# Patient Record
Sex: Female | Born: 1960 | Hispanic: Yes | Marital: Single
Health system: Southern US, Academic
[De-identification: ages and names within clinical notes are randomized; demographics above are authoritative.]

## PROBLEM LIST (undated history)

## (undated) ENCOUNTER — Ambulatory Visit: Payer: PRIVATE HEALTH INSURANCE

## (undated) ENCOUNTER — Encounter

## (undated) ENCOUNTER — Encounter
Attending: Student in an Organized Health Care Education/Training Program | Primary: Student in an Organized Health Care Education/Training Program

## (undated) ENCOUNTER — Ambulatory Visit

## (undated) ENCOUNTER — Telehealth

## (undated) ENCOUNTER — Encounter: Attending: Adult Health | Primary: Adult Health

## (undated) ENCOUNTER — Encounter
Attending: Female Pelvic Medicine and Reconstructive Surgery | Primary: Female Pelvic Medicine and Reconstructive Surgery

## (undated) ENCOUNTER — Telehealth: Attending: Ambulatory Care | Primary: Ambulatory Care

## (undated) ENCOUNTER — Encounter: Payer: PRIVATE HEALTH INSURANCE | Attending: Dermatology | Primary: Dermatology

## (undated) ENCOUNTER — Other Ambulatory Visit

## (undated) ENCOUNTER — Ambulatory Visit: Payer: Medicare (Managed Care)

## (undated) ENCOUNTER — Encounter: Payer: PRIVATE HEALTH INSURANCE | Attending: Registered" | Primary: Registered"

## (undated) ENCOUNTER — Ambulatory Visit
Payer: PRIVATE HEALTH INSURANCE | Attending: Student in an Organized Health Care Education/Training Program | Primary: Student in an Organized Health Care Education/Training Program

## (undated) ENCOUNTER — Ambulatory Visit: Payer: PRIVATE HEALTH INSURANCE | Attending: Registered" | Primary: Registered"

## (undated) ENCOUNTER — Telehealth
Attending: Student in an Organized Health Care Education/Training Program | Primary: Student in an Organized Health Care Education/Training Program

## (undated) ENCOUNTER — Ambulatory Visit: Attending: Registered" | Primary: Registered"

## (undated) ENCOUNTER — Encounter: Attending: Surgery | Primary: Surgery

## (undated) ENCOUNTER — Telehealth: Attending: Adult Health | Primary: Adult Health

## (undated) ENCOUNTER — Ambulatory Visit: Payer: MEDICAID

## (undated) ENCOUNTER — Ambulatory Visit
Payer: Medicare (Managed Care) | Attending: Student in an Organized Health Care Education/Training Program | Primary: Student in an Organized Health Care Education/Training Program

## (undated) ENCOUNTER — Ambulatory Visit: Payer: MEDICARE

## (undated) ENCOUNTER — Telehealth: Attending: "Endocrinology | Primary: "Endocrinology

## (undated) ENCOUNTER — Ambulatory Visit
Payer: MEDICARE | Attending: Student in an Organized Health Care Education/Training Program | Primary: Student in an Organized Health Care Education/Training Program

## (undated) ENCOUNTER — Ambulatory Visit
Attending: Student in an Organized Health Care Education/Training Program | Primary: Student in an Organized Health Care Education/Training Program

## (undated) ENCOUNTER — Non-Acute Institutional Stay
Payer: PRIVATE HEALTH INSURANCE | Attending: Physical Medicine & Rehabilitation | Primary: Physical Medicine & Rehabilitation

## (undated) ENCOUNTER — Encounter: Attending: Ambulatory Care | Primary: Ambulatory Care

## (undated) ENCOUNTER — Ambulatory Visit: Payer: PRIVATE HEALTH INSURANCE | Attending: Internal Medicine | Primary: Internal Medicine

## (undated) ENCOUNTER — Ambulatory Visit: Attending: Ophthalmology | Primary: Ophthalmology

## (undated) ENCOUNTER — Encounter: Attending: Pharmacist | Primary: Pharmacist

## (undated) ENCOUNTER — Encounter: Attending: Dermatology | Primary: Dermatology

## (undated) ENCOUNTER — Ambulatory Visit: Payer: PRIVATE HEALTH INSURANCE | Attending: Rheumatology | Primary: Rheumatology

## (undated) DIAGNOSIS — E119 Type 2 diabetes mellitus without complications: Secondary | ICD-10-CM

## (undated) HISTORY — PX: EYE SURGERY: SHX253

---
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
## 2004-03-28 HISTORY — PX: SHOULDER SURGERY: SHX246

## 2014-07-11 ENCOUNTER — Other Ambulatory Visit: Payer: Self-pay | Admitting: Oncology

## 2014-07-11 DIAGNOSIS — Z1231 Encounter for screening mammogram for malignant neoplasm of breast: Secondary | ICD-10-CM

## 2014-08-18 ENCOUNTER — Other Ambulatory Visit: Payer: Self-pay | Admitting: Nurse Practitioner

## 2014-08-18 ENCOUNTER — Ambulatory Visit
Admission: RE | Admit: 2014-08-18 | Discharge: 2014-08-18 | Disposition: A | Discharge status: Home / Self Care | Payer: 59 | Source: Ambulatory Visit | Attending: Nurse Practitioner | Admitting: Nurse Practitioner

## 2014-08-18 DIAGNOSIS — Z1231 Encounter for screening mammogram for malignant neoplasm of breast: Secondary | ICD-10-CM | POA: Diagnosis present

## 2016-09-26 ENCOUNTER — Ambulatory Visit: Admission: RE | Admit: 2016-09-26 | Discharge: 2016-09-26

## 2016-09-26 DIAGNOSIS — E11319 Type 2 diabetes mellitus with unspecified diabetic retinopathy without macular edema: Principal | ICD-10-CM

## 2016-09-26 DIAGNOSIS — Z1239 Encounter for other screening for malignant neoplasm of breast: Secondary | ICD-10-CM

## 2016-09-26 DIAGNOSIS — D649 Anemia, unspecified: Secondary | ICD-10-CM

## 2016-09-26 MED ORDER — INSULIN HUMAN U-100 NPH-REGULR 70-30 MIX 100 UNIT/ML SUBCUTANEOUS SUSP
2 refills | 0 days | Status: CP
Start: 2016-09-26 — End: 2017-05-23

## 2016-09-26 MED ORDER — CANAGLIFLOZIN 100 MG TABLET
ORAL_TABLET | Freq: Every day | ORAL | 0 refills | 0 days
Start: 2016-09-26 — End: 2017-05-26

## 2016-09-26 MED ORDER — ATORVASTATIN 10 MG TABLET
ORAL_TABLET | Freq: Every day | ORAL | 1 refills | 0 days | Status: CP
Start: 2016-09-26 — End: 2017-08-29

## 2016-09-26 MED ORDER — METFORMIN 1,000 MG TABLET
ORAL_TABLET | Freq: Two times a day (BID) | ORAL | 1 refills | 0 days | Status: CP
Start: 2016-09-26 — End: 2016-10-31

## 2016-09-26 MED ORDER — ASPIRIN 81 MG TABLET,DELAYED RELEASE
ORAL_TABLET | Freq: Every day | ORAL | 2 refills | 0.00000 days | Status: SS
Start: 2016-09-26 — End: 2018-03-01

## 2016-09-26 MED ORDER — OMEPRAZOLE 20 MG CAPSULE,DELAYED RELEASE
ORAL_CAPSULE | Freq: Two times a day (BID) | ORAL | 1 refills | 0 days
Start: 2016-09-26 — End: 2017-04-21

## 2016-10-05 ENCOUNTER — Ambulatory Visit
Admission: RE | Admit: 2016-10-05 | Discharge: 2016-10-05 | Attending: Student in an Organized Health Care Education/Training Program | Admitting: Student in an Organized Health Care Education/Training Program

## 2016-10-05 DIAGNOSIS — H40003 Preglaucoma, unspecified, bilateral: Principal | ICD-10-CM

## 2016-10-17 ENCOUNTER — Ambulatory Visit: Admission: RE | Admit: 2016-10-17 | Discharge: 2016-10-17 | Disposition: A | Discharge status: Home / Self Care

## 2016-10-17 DIAGNOSIS — Z1239 Encounter for other screening for malignant neoplasm of breast: Principal | ICD-10-CM

## 2016-10-31 ENCOUNTER — Ambulatory Visit: Admission: RE | Admit: 2016-10-31 | Discharge: 2016-10-31 | Disposition: A | Discharge status: Home / Self Care

## 2016-10-31 DIAGNOSIS — E11319 Type 2 diabetes mellitus with unspecified diabetic retinopathy without macular edema: Principal | ICD-10-CM

## 2016-10-31 DIAGNOSIS — R102 Pelvic and perineal pain: Secondary | ICD-10-CM

## 2016-10-31 DIAGNOSIS — Z124 Encounter for screening for malignant neoplasm of cervix: Secondary | ICD-10-CM

## 2016-10-31 MED ORDER — METFORMIN 1,000 MG TABLET: 1000 mg | each | 6 refills | 0 days

## 2016-10-31 MED ORDER — METFORMIN 1,000 MG TABLET
Freq: Two times a day (BID) | ORAL | 6 refills | 0.00000 days | Status: CP
Start: 2016-10-31 — End: 2016-10-31

## 2016-11-16 ENCOUNTER — Ambulatory Visit
Admission: RE | Admit: 2016-11-16 | Discharge: 2016-11-16 | Disposition: A | Discharge status: Home / Self Care | Attending: Internal Medicine | Admitting: Internal Medicine

## 2016-11-16 DIAGNOSIS — E11319 Type 2 diabetes mellitus with unspecified diabetic retinopathy without macular edema: Principal | ICD-10-CM

## 2016-11-16 DIAGNOSIS — E113513 Type 2 diabetes mellitus with proliferative diabetic retinopathy with macular edema, bilateral: Secondary | ICD-10-CM

## 2016-11-16 MED ORDER — BLOOD-GLUCOSE METER
0 refills | 0.00000 days | Status: CP
Start: 2016-11-16 — End: 2017-05-26

## 2016-11-16 MED ORDER — LANCING DEVICE
0 refills | 0 days
Start: 2016-11-16 — End: 2017-11-10

## 2016-11-16 MED ORDER — BLOOD SUGAR DIAGNOSTIC STRIPS: each | 12 refills | 0 days

## 2016-11-16 MED ORDER — BLOOD SUGAR DIAGNOSTIC STRIPS
12 refills | 0 days | Status: CP
Start: 2016-11-16 — End: 2016-11-16

## 2016-11-16 MED ORDER — LANCETS 30 GAUGE
12 refills | 0 days
Start: 2016-11-16 — End: 2017-11-10

## 2016-11-16 MED ORDER — LANCETS
12 refills | 0 days | Status: CP
Start: 2016-11-16 — End: 2017-05-26

## 2016-11-16 MED ORDER — BLOOD-GLUCOSE METER: each | 0 refills | 0 days

## 2016-11-25 ENCOUNTER — Ambulatory Visit: Admission: RE | Admit: 2016-11-25 | Discharge: 2016-11-25 | Disposition: A | Discharge status: Home / Self Care

## 2016-11-25 DIAGNOSIS — R102 Pelvic and perineal pain: Principal | ICD-10-CM

## 2016-11-29 ENCOUNTER — Ambulatory Visit: Admission: RE | Admit: 2016-11-29 | Discharge: 2016-11-29 | Disposition: A | Discharge status: Home / Self Care

## 2016-11-29 DIAGNOSIS — D649 Anemia, unspecified: Secondary | ICD-10-CM

## 2016-11-29 DIAGNOSIS — E11319 Type 2 diabetes mellitus with unspecified diabetic retinopathy without macular edema: Principal | ICD-10-CM

## 2016-11-29 DIAGNOSIS — N898 Other specified noninflammatory disorders of vagina: Secondary | ICD-10-CM

## 2016-11-29 DIAGNOSIS — Z113 Encounter for screening for infections with a predominantly sexual mode of transmission: Secondary | ICD-10-CM

## 2016-11-29 DIAGNOSIS — L732 Hidradenitis suppurativa: Secondary | ICD-10-CM

## 2016-11-29 MED ORDER — METFORMIN 1,000 MG TABLET
ORAL_TABLET | Freq: Two times a day (BID) | ORAL | 1 refills | 0.00000 days | Status: CP
Start: 2016-11-29 — End: 2017-05-23

## 2016-11-29 MED ORDER — METFORMIN 1,000 MG TABLET: 1000 mg | tablet | Freq: Two times a day (BID) | 1 refills | 0 days | Status: AC

## 2016-11-29 MED ORDER — DOXYCYCLINE HYCLATE 100 MG CAPSULE
ORAL_CAPSULE | Freq: Two times a day (BID) | ORAL | 0 refills | 0.00000 days | Status: CP
Start: 2016-11-29 — End: 2016-11-29

## 2016-11-29 MED ORDER — DOXYCYCLINE HYCLATE 100 MG CAPSULE: capsule | 0 refills | 0 days

## 2016-12-23 ENCOUNTER — Ambulatory Visit: Admission: RE | Admit: 2016-12-23 | Discharge: 2016-12-23

## 2016-12-23 DIAGNOSIS — H4312 Vitreous hemorrhage, left eye: Principal | ICD-10-CM

## 2016-12-23 DIAGNOSIS — H4311 Vitreous hemorrhage, right eye: Secondary | ICD-10-CM

## 2017-01-11 ENCOUNTER — Ambulatory Visit
Admission: RE | Admit: 2017-01-11 | Discharge: 2017-01-11 | Payer: Commercial Managed Care - PPO | Attending: Student in an Organized Health Care Education/Training Program | Admitting: Student in an Organized Health Care Education/Training Program

## 2017-01-11 DIAGNOSIS — H2513 Age-related nuclear cataract, bilateral: Secondary | ICD-10-CM

## 2017-01-11 DIAGNOSIS — H4051X3 Glaucoma secondary to other eye disorders, right eye, severe stage: Principal | ICD-10-CM

## 2017-01-25 ENCOUNTER — Ambulatory Visit
Admission: RE | Admit: 2017-01-25 | Discharge: 2017-01-25 | Payer: Commercial Managed Care - PPO | Attending: Registered" | Admitting: Registered"

## 2017-01-25 DIAGNOSIS — E669 Obesity, unspecified: Secondary | ICD-10-CM

## 2017-01-25 DIAGNOSIS — E11319 Type 2 diabetes mellitus with unspecified diabetic retinopathy without macular edema: Principal | ICD-10-CM

## 2017-02-03 ENCOUNTER — Ambulatory Visit: Admission: RE | Admit: 2017-02-03 | Discharge: 2017-02-03

## 2017-02-03 ENCOUNTER — Ambulatory Visit: Admission: RE | Admit: 2017-02-03 | Discharge: 2017-02-03 | Payer: Commercial Managed Care - PPO

## 2017-02-03 DIAGNOSIS — L732 Hidradenitis suppurativa: Principal | ICD-10-CM

## 2017-02-03 DIAGNOSIS — E113599 Type 2 diabetes mellitus with proliferative diabetic retinopathy without macular edema, unspecified eye: Principal | ICD-10-CM

## 2017-02-03 MED ORDER — DOXYCYCLINE MONOHYDRATE 100 MG CAPSULE
ORAL_CAPSULE | Freq: Two times a day (BID) | ORAL | 1 refills | 0.00000 days | Status: CP
Start: 2017-02-03 — End: 2017-02-03

## 2017-02-03 MED ORDER — DOXYCYCLINE MONOHYDRATE 100 MG CAPSULE: 100 mg | capsule | Freq: Two times a day (BID) | 1 refills | 0 days | Status: AC

## 2017-02-13 ENCOUNTER — Ambulatory Visit: Admission: RE | Admit: 2017-02-13 | Discharge: 2017-02-13

## 2017-02-13 DIAGNOSIS — G4700 Insomnia, unspecified: Secondary | ICD-10-CM

## 2017-02-13 DIAGNOSIS — E11319 Type 2 diabetes mellitus with unspecified diabetic retinopathy without macular edema: Principal | ICD-10-CM

## 2017-02-13 MED ORDER — TRAZODONE 50 MG TABLET
ORAL_TABLET | Freq: Every evening | ORAL | 1 refills | 0.00000 days | Status: CP
Start: 2017-02-13 — End: 2018-11-27

## 2017-02-13 MED ORDER — TRAZODONE 50 MG TABLET: tablet | 1 refills | 0 days

## 2017-03-24 ENCOUNTER — Ambulatory Visit: Admission: RE | Admit: 2017-03-24 | Discharge: 2017-03-24 | Payer: Commercial Managed Care - PPO

## 2017-03-24 DIAGNOSIS — E113599 Type 2 diabetes mellitus with proliferative diabetic retinopathy without macular edema, unspecified eye: Secondary | ICD-10-CM

## 2017-03-24 DIAGNOSIS — E113513 Type 2 diabetes mellitus with proliferative diabetic retinopathy with macular edema, bilateral: Principal | ICD-10-CM

## 2017-04-21 ENCOUNTER — Encounter: Admit: 2017-04-21 | Discharge: 2017-04-22 | Payer: PRIVATE HEALTH INSURANCE

## 2017-04-21 DIAGNOSIS — L732 Hidradenitis suppurativa: Principal | ICD-10-CM

## 2017-04-21 MED ORDER — DOXYCYCLINE MONOHYDRATE 100 MG CAPSULE
ORAL_CAPSULE | Freq: Two times a day (BID) | ORAL | 0 refills | 0.00000 days | Status: CP
Start: 2017-04-21 — End: 2017-06-05

## 2017-04-21 MED ORDER — DOXYCYCLINE MONOHYDRATE 100 MG CAPSULE: 100 mg | capsule | Freq: Two times a day (BID) | 0 refills | 0 days | Status: AC

## 2017-05-10 ENCOUNTER — Encounter
Admit: 2017-05-10 | Discharge: 2017-05-11 | Payer: PRIVATE HEALTH INSURANCE | Attending: Registered" | Primary: Registered"

## 2017-05-10 DIAGNOSIS — E669 Obesity, unspecified: Principal | ICD-10-CM

## 2017-05-10 MED ORDER — OMEPRAZOLE 20 MG CAPSULE,DELAYED RELEASE
ORAL_CAPSULE | Freq: Two times a day (BID) | ORAL | 0 refills | 0 days | Status: CP
Start: 2017-05-10 — End: 2017-05-16

## 2017-05-16 MED ORDER — OMEPRAZOLE 20 MG CAPSULE,DELAYED RELEASE: 20 mg | capsule | Freq: Two times a day (BID) | 0 refills | 0 days | Status: AC

## 2017-05-16 MED ORDER — OMEPRAZOLE 20 MG CAPSULE,DELAYED RELEASE
ORAL_CAPSULE | Freq: Two times a day (BID) | ORAL | 2 refills | 0.00000 days | Status: CP
Start: 2017-05-16 — End: 2017-05-16

## 2017-05-16 MED ORDER — OMEPRAZOLE 20 MG CAPSULE,DELAYED RELEASE: 20 mg | capsule | 2 refills | 0 days

## 2017-05-23 ENCOUNTER — Encounter: Admit: 2017-05-23 | Discharge: 2017-05-24 | Payer: PRIVATE HEALTH INSURANCE

## 2017-05-23 DIAGNOSIS — G4700 Insomnia, unspecified: Secondary | ICD-10-CM

## 2017-05-23 DIAGNOSIS — R011 Cardiac murmur, unspecified: Secondary | ICD-10-CM

## 2017-05-23 DIAGNOSIS — E669 Obesity, unspecified: Secondary | ICD-10-CM

## 2017-05-23 DIAGNOSIS — Z23 Encounter for immunization: Secondary | ICD-10-CM

## 2017-05-23 DIAGNOSIS — M25539 Pain in unspecified wrist: Secondary | ICD-10-CM

## 2017-05-23 DIAGNOSIS — E11319 Type 2 diabetes mellitus with unspecified diabetic retinopathy without macular edema: Principal | ICD-10-CM

## 2017-05-23 MED ORDER — METFORMIN 1,000 MG TABLET: 1000 mg | tablet | Freq: Two times a day (BID) | 3 refills | 0 days | Status: AC

## 2017-05-23 MED ORDER — INSULIN HUMAN U-100 NPH-REGULR 70-30 MIX 100 UNIT/ML SUBCUTANEOUS SUSP: mL | 2 refills | 0 days | Status: AC

## 2017-05-23 MED ORDER — METFORMIN 1,000 MG TABLET
ORAL_TABLET | Freq: Two times a day (BID) | ORAL | 3 refills | 0.00000 days | Status: CP
Start: 2017-05-23 — End: 2017-05-30

## 2017-05-23 MED ORDER — INSULIN HUMAN U-100 NPH-REGULR 70-30 MIX 100 UNIT/ML SUBCUTANEOUS SUSP
2 refills | 0.00000 days | Status: CP
Start: 2017-05-23 — End: 2017-05-26

## 2017-05-26 MED ORDER — BLOOD SUGAR DIAGNOSTIC STRIPS: each | 25 refills | 0 days

## 2017-05-26 MED ORDER — BLOOD-GLUCOSE METER: each | 0 refills | 0 days | Status: AC

## 2017-05-26 MED ORDER — BLOOD SUGAR DIAGNOSTIC STRIPS
ORAL_STRIP | 12 refills | 0.00000 days | Status: CP
Start: 2017-05-26 — End: 2017-08-29

## 2017-05-26 MED ORDER — LANCING DEVICE: 1 | each | Freq: Four times a day (QID) | 1 refills | 0 days | Status: AC

## 2017-05-26 MED ORDER — CANAGLIFLOZIN 100 MG TABLET
ORAL_TABLET | Freq: Every day | ORAL | 1 refills | 0.00000 days | Status: CP
Start: 2017-05-26 — End: 2017-05-30

## 2017-05-26 MED ORDER — INSULIN HUMAN U-100 NPH-REGULR 70-30 MIX 100 UNIT/ML SUBCUTANEOUS SUSP: mL | 2 refills | 0 days | Status: AC

## 2017-05-26 MED ORDER — LANCETS 30 GAUGE
5 refills | 0 days
Start: 2017-05-26 — End: 2017-11-10

## 2017-05-26 MED ORDER — LANCETS
Freq: Four times a day (QID) | 1 refills | 0.00000 days | Status: CP
Start: 2017-05-26 — End: 2018-03-22

## 2017-05-26 MED ORDER — CANAGLIFLOZIN 300 MG TABLET
ORAL_TABLET | 15 refills | 0 days
Start: 2017-05-26 — End: 2017-11-10

## 2017-05-26 MED ORDER — INSULIN HUMAN U-100 NPH-REGULR 70-30 MIX 100 UNIT/ML SUBCUTANEOUS SUSP: mL | 7 refills | 0 days

## 2017-05-26 MED ORDER — LANCING DEVICE
Freq: Four times a day (QID) | 1 refills | 0.00000 days | Status: CP
Start: 2017-05-26 — End: 2018-05-26

## 2017-05-26 MED ORDER — INSULIN HUMAN U-100 NPH-REGULR 70-30 MIX 100 UNIT/ML SUBCUTANEOUS SUSP: mL | 2 refills | 0 days

## 2017-05-26 MED ORDER — CANAGLIFLOZIN 100 MG TABLET: 300 mg | tablet | Freq: Every day | 1 refills | 0 days

## 2017-05-26 MED ORDER — INSULIN HUMAN U-100 NPH-REGULR 70-30 MIX 100 UNIT/ML SUBCUTANEOUS SUSP
2 refills | 0.00000 days | Status: CP
Start: 2017-05-26 — End: 2017-11-10

## 2017-05-26 MED ORDER — BLOOD-GLUCOSE METER
0 refills | 0.00000 days | Status: CP
Start: 2017-05-26 — End: 2018-03-22

## 2017-05-30 MED ORDER — CANAGLIFLOZIN 300 MG TABLET: tablet | 8 refills | 0 days

## 2017-05-30 MED ORDER — METFORMIN 1,000 MG TABLET: each | 3 refills | 0 days

## 2017-05-30 MED ORDER — CANAGLIFLOZIN 300 MG TABLET: 300 mg | tablet | Freq: Every day | 3 refills | 0 days | Status: AC

## 2017-05-30 MED ORDER — METFORMIN 1,000 MG TABLET
Freq: Two times a day (BID) | ORAL | 3 refills | 0.00000 days | Status: CP
Start: 2017-05-30 — End: 2017-11-10

## 2017-05-30 MED ORDER — CANAGLIFLOZIN 300 MG TABLET
ORAL_TABLET | Freq: Every day | ORAL | 3 refills | 0.00000 days | Status: CP
Start: 2017-05-30 — End: 2017-05-30

## 2017-06-05 ENCOUNTER — Encounter: Admit: 2017-06-05 | Discharge: 2017-06-06 | Payer: PRIVATE HEALTH INSURANCE

## 2017-06-05 DIAGNOSIS — L732 Hidradenitis suppurativa: Principal | ICD-10-CM

## 2017-06-05 MED ORDER — DOXYCYCLINE MONOHYDRATE 100 MG CAPSULE
ORAL_CAPSULE | Freq: Two times a day (BID) | ORAL | 2 refills | 0.00000 days | Status: CP
Start: 2017-06-05 — End: 2017-08-31

## 2017-06-05 MED ORDER — DOXYCYCLINE MONOHYDRATE 100 MG CAPSULE: capsule | 2 refills | 0 days

## 2017-06-07 ENCOUNTER — Encounter: Admit: 2017-06-07 | Discharge: 2017-06-08 | Payer: PRIVATE HEALTH INSURANCE

## 2017-06-09 ENCOUNTER — Ambulatory Visit: Admit: 2017-06-09 | Discharge: 2017-06-10 | Payer: PRIVATE HEALTH INSURANCE

## 2017-06-09 DIAGNOSIS — R011 Cardiac murmur, unspecified: Principal | ICD-10-CM

## 2017-06-20 ENCOUNTER — Emergency Department
Admit: 2017-06-20 | Discharge: 2017-06-20 | Disposition: A | Discharge status: Home / Self Care | Payer: PRIVATE HEALTH INSURANCE

## 2017-06-20 ENCOUNTER — Encounter: Admit: 2017-06-20 | Discharge: 2017-06-21 | Payer: PRIVATE HEALTH INSURANCE

## 2017-06-20 ENCOUNTER — Ambulatory Visit
Admit: 2017-06-20 | Discharge: 2017-06-20 | Disposition: A | Discharge status: Home / Self Care | Payer: PRIVATE HEALTH INSURANCE

## 2017-06-20 DIAGNOSIS — R079 Chest pain, unspecified: Principal | ICD-10-CM

## 2017-06-20 DIAGNOSIS — E11319 Type 2 diabetes mellitus with unspecified diabetic retinopathy without macular edema: Secondary | ICD-10-CM

## 2017-06-20 DIAGNOSIS — R0789 Other chest pain: Principal | ICD-10-CM

## 2017-06-20 MED ORDER — OMEPRAZOLE 20 MG CAPSULE,DELAYED RELEASE
ORAL_CAPSULE | Freq: Every day | ORAL | 0 refills | 0.00000 days | Status: CP
Start: 2017-06-20 — End: 2017-11-10

## 2017-06-20 MED ORDER — OMEPRAZOLE 20 MG CAPSULE,DELAYED RELEASE: capsule | 0 refills | 0 days

## 2017-06-20 MED ORDER — INSULIN HUMAN U-100 NPH-REGULR 70-30 MIX 100 UNIT/ML SUBCUTANEOUS SUSP
1 refills | 0 days
Start: 2017-06-20 — End: 2017-08-29

## 2017-06-23 ENCOUNTER — Encounter
Admit: 2017-06-23 | Discharge: 2017-06-24 | Payer: PRIVATE HEALTH INSURANCE | Attending: Student in an Organized Health Care Education/Training Program | Primary: Student in an Organized Health Care Education/Training Program

## 2017-06-23 DIAGNOSIS — E113513 Type 2 diabetes mellitus with proliferative diabetic retinopathy with macular edema, bilateral: Principal | ICD-10-CM

## 2017-06-23 DIAGNOSIS — E113599 Type 2 diabetes mellitus with proliferative diabetic retinopathy without macular edema, unspecified eye: Secondary | ICD-10-CM

## 2017-06-23 DIAGNOSIS — H2513 Age-related nuclear cataract, bilateral: Secondary | ICD-10-CM

## 2017-06-23 DIAGNOSIS — H4051X3 Glaucoma secondary to other eye disorders, right eye, severe stage: Secondary | ICD-10-CM

## 2017-07-07 ENCOUNTER — Encounter: Admit: 2017-07-07 | Discharge: 2017-07-08 | Payer: PRIVATE HEALTH INSURANCE

## 2017-07-07 DIAGNOSIS — E113513 Type 2 diabetes mellitus with proliferative diabetic retinopathy with macular edema, bilateral: Principal | ICD-10-CM

## 2017-08-03 ENCOUNTER — Ambulatory Visit: Admit: 2017-08-03 | Discharge: 2017-08-04 | Payer: PRIVATE HEALTH INSURANCE

## 2017-08-03 DIAGNOSIS — E113513 Type 2 diabetes mellitus with proliferative diabetic retinopathy with macular edema, bilateral: Principal | ICD-10-CM

## 2017-08-16 ENCOUNTER — Encounter
Admit: 2017-08-16 | Discharge: 2017-08-17 | Payer: PRIVATE HEALTH INSURANCE | Attending: Registered" | Primary: Registered"

## 2017-08-16 DIAGNOSIS — E669 Obesity, unspecified: Principal | ICD-10-CM

## 2017-08-29 ENCOUNTER — Encounter: Admit: 2017-08-29 | Discharge: 2017-08-30 | Payer: PRIVATE HEALTH INSURANCE

## 2017-08-29 DIAGNOSIS — M7989 Other specified soft tissue disorders: Secondary | ICD-10-CM

## 2017-08-29 DIAGNOSIS — R0789 Other chest pain: Secondary | ICD-10-CM

## 2017-08-29 DIAGNOSIS — E11319 Type 2 diabetes mellitus with unspecified diabetic retinopathy without macular edema: Principal | ICD-10-CM

## 2017-08-29 DIAGNOSIS — M25539 Pain in unspecified wrist: Secondary | ICD-10-CM

## 2017-08-29 DIAGNOSIS — E785 Hyperlipidemia, unspecified: Secondary | ICD-10-CM

## 2017-08-29 MED ORDER — OMEPRAZOLE 20 MG CAPSULE,DELAYED RELEASE: 20 mg | capsule | Freq: Every day | 1 refills | 0 days | Status: AC

## 2017-08-29 MED ORDER — INSULIN HUMAN U-100 NPH-REGULR 70-30 MIX 100 UNIT/ML SUBCUTANEOUS SUSP
5 refills | 0.00000 days | Status: CP
Start: 2017-08-29 — End: 2017-08-29

## 2017-08-29 MED ORDER — INSULIN HUMAN U-100 NPH-REGULR 70-30 MIX 100 UNIT/ML SUBCUTANEOUS SUSP: mL | 23 refills | 0 days

## 2017-08-29 MED ORDER — CETIRIZINE 10 MG TABLET: tablet | 2 refills | 0 days

## 2017-08-29 MED ORDER — BLOOD SUGAR DIAGNOSTIC STRIPS: strip | 12 refills | 0 days | Status: AC

## 2017-08-29 MED ORDER — ATORVASTATIN 10 MG TABLET
ORAL_TABLET | Freq: Every day | ORAL | 3 refills | 0 days | Status: CP
Start: 2017-08-29 — End: 2017-09-20

## 2017-08-29 MED ORDER — OMEPRAZOLE 20 MG CAPSULE,DELAYED RELEASE
ORAL_CAPSULE | Freq: Every day | ORAL | 1 refills | 0.00000 days | Status: CP
Start: 2017-08-29 — End: 2017-08-29

## 2017-08-29 MED ORDER — BLOOD SUGAR DIAGNOSTIC STRIPS
ORAL_STRIP | 12 refills | 0.00000 days | Status: CP
Start: 2017-08-29 — End: 2017-08-29

## 2017-08-29 MED ORDER — CETIRIZINE 10 MG TABLET
ORAL_TABLET | Freq: Every day | ORAL | 2 refills | 0.00000 days | Status: CP
Start: 2017-08-29 — End: 2018-08-29

## 2017-08-31 ENCOUNTER — Encounter: Admit: 2017-08-31 | Discharge: 2017-09-01 | Payer: PRIVATE HEALTH INSURANCE

## 2017-08-31 DIAGNOSIS — E113513 Type 2 diabetes mellitus with proliferative diabetic retinopathy with macular edema, bilateral: Principal | ICD-10-CM

## 2017-09-05 ENCOUNTER — Encounter: Admit: 2017-09-05 | Discharge: 2017-09-06 | Payer: PRIVATE HEALTH INSURANCE

## 2017-09-05 DIAGNOSIS — E785 Hyperlipidemia, unspecified: Principal | ICD-10-CM

## 2017-09-13 ENCOUNTER — Encounter: Admit: 2017-09-13 | Discharge: 2017-10-12 | Payer: PRIVATE HEALTH INSURANCE

## 2017-09-13 ENCOUNTER — Ambulatory Visit: Admit: 2017-09-13 | Discharge: 2017-10-12 | Payer: PRIVATE HEALTH INSURANCE

## 2017-09-13 ENCOUNTER — Encounter: Admit: 2017-09-13 | Discharge: 2017-09-26 | Payer: PRIVATE HEALTH INSURANCE

## 2017-09-13 DIAGNOSIS — R0789 Other chest pain: Principal | ICD-10-CM

## 2017-09-18 ENCOUNTER — Encounter
Admit: 2017-09-18 | Discharge: 2017-09-19 | Payer: PRIVATE HEALTH INSURANCE | Attending: Dermatology | Primary: Dermatology

## 2017-09-18 DIAGNOSIS — L732 Hidradenitis suppurativa: Principal | ICD-10-CM

## 2017-09-18 MED ORDER — DOXYCYCLINE MONOHYDRATE 100 MG CAPSULE: capsule | 2 refills | 0 days

## 2017-09-18 MED ORDER — DOXYCYCLINE MONOHYDRATE 100 MG CAPSULE
ORAL_CAPSULE | Freq: Two times a day (BID) | ORAL | 0 refills | 0.00000 days | Status: CP
Start: 2017-09-18 — End: 2017-09-18

## 2017-09-20 MED ORDER — ATORVASTATIN 20 MG TABLET
ORAL_TABLET | Freq: Every day | ORAL | 3 refills | 0.00000 days | Status: CP
Start: 2017-09-20 — End: 2017-11-10

## 2017-09-20 MED ORDER — ATORVASTATIN 20 MG TABLET: 20 mg | tablet | Freq: Every day | 3 refills | 0 days | Status: AC

## 2017-11-10 MED ORDER — CANAGLIFLOZIN 300 MG TABLET
ORAL_TABLET | Freq: Every morning | ORAL | 3 refills | 0 days | Status: CP
Start: 2017-11-10 — End: 2018-09-05

## 2017-11-10 MED ORDER — ATORVASTATIN 20 MG TABLET
ORAL_TABLET | Freq: Every day | ORAL | 3 refills | 0.00000 days | Status: CP
Start: 2017-11-10 — End: 2018-09-05

## 2017-11-10 MED ORDER — DOXYCYCLINE MONOHYDRATE 100 MG CAPSULE
ORAL_CAPSULE | Freq: Two times a day (BID) | ORAL | 0 refills | 0.00000 days | Status: CP
Start: 2017-11-10 — End: 2018-05-22

## 2017-11-10 MED ORDER — METFORMIN 1,000 MG TABLET
ORAL_TABLET | Freq: Two times a day (BID) | ORAL | 3 refills | 0 days | Status: CP
Start: 2017-11-10 — End: 2017-11-29

## 2017-11-10 MED ORDER — INSULIN HUMAN U-100 NPH-REGULR 70-30 MIX 100 UNIT/ML SUBCUTANEOUS SUSP
Freq: Two times a day (BID) | SUBCUTANEOUS | 3 refills | 0 days | Status: CP
Start: 2017-11-10 — End: 2017-12-22

## 2017-11-10 MED ORDER — OMEPRAZOLE 20 MG CAPSULE,DELAYED RELEASE
ORAL_CAPSULE | Freq: Every day | ORAL | 3 refills | 0 days | Status: CP
Start: 2017-11-10 — End: 2018-09-14

## 2017-11-29 ENCOUNTER — Ambulatory Visit: Admit: 2017-11-29 | Discharge: 2017-11-30 | Payer: PRIVATE HEALTH INSURANCE

## 2017-11-29 DIAGNOSIS — E11319 Type 2 diabetes mellitus with unspecified diabetic retinopathy without macular edema: Principal | ICD-10-CM

## 2017-11-29 DIAGNOSIS — I10 Essential (primary) hypertension: Secondary | ICD-10-CM

## 2017-11-29 DIAGNOSIS — E785 Hyperlipidemia, unspecified: Secondary | ICD-10-CM

## 2017-11-29 DIAGNOSIS — Z23 Encounter for immunization: Secondary | ICD-10-CM

## 2017-11-29 MED ORDER — METFORMIN 1,000 MG TABLET
ORAL_TABLET | Freq: Two times a day (BID) | ORAL | 3 refills | 0.00000 days | Status: CP
Start: 2017-11-29 — End: 2018-09-05

## 2017-11-29 MED ORDER — LOSARTAN 25 MG TABLET
ORAL_TABLET | Freq: Every day | ORAL | 3 refills | 0 days | Status: CP
Start: 2017-11-29 — End: 2018-09-05

## 2017-11-30 ENCOUNTER — Encounter: Admit: 2017-11-30 | Discharge: 2017-12-01 | Payer: PRIVATE HEALTH INSURANCE

## 2017-11-30 DIAGNOSIS — H4051X3 Glaucoma secondary to other eye disorders, right eye, severe stage: Secondary | ICD-10-CM

## 2017-11-30 DIAGNOSIS — H2513 Age-related nuclear cataract, bilateral: Secondary | ICD-10-CM

## 2017-11-30 DIAGNOSIS — E113599 Type 2 diabetes mellitus with proliferative diabetic retinopathy without macular edema, unspecified eye: Secondary | ICD-10-CM

## 2017-11-30 DIAGNOSIS — H4312 Vitreous hemorrhage, left eye: Secondary | ICD-10-CM

## 2017-11-30 DIAGNOSIS — E113513 Type 2 diabetes mellitus with proliferative diabetic retinopathy with macular edema, bilateral: Principal | ICD-10-CM

## 2017-12-05 ENCOUNTER — Other Ambulatory Visit: Admit: 2017-12-05 | Discharge: 2017-12-06 | Payer: PRIVATE HEALTH INSURANCE

## 2017-12-05 DIAGNOSIS — I10 Essential (primary) hypertension: Secondary | ICD-10-CM

## 2017-12-05 DIAGNOSIS — E11319 Type 2 diabetes mellitus with unspecified diabetic retinopathy without macular edema: Principal | ICD-10-CM

## 2017-12-05 DIAGNOSIS — E785 Hyperlipidemia, unspecified: Secondary | ICD-10-CM

## 2017-12-22 ENCOUNTER — Encounter: Admit: 2017-12-22 | Discharge: 2017-12-23 | Payer: PRIVATE HEALTH INSURANCE

## 2017-12-22 DIAGNOSIS — E785 Hyperlipidemia, unspecified: Secondary | ICD-10-CM

## 2017-12-22 DIAGNOSIS — Z794 Long term (current) use of insulin: Secondary | ICD-10-CM

## 2017-12-22 DIAGNOSIS — E11319 Type 2 diabetes mellitus with unspecified diabetic retinopathy without macular edema: Principal | ICD-10-CM

## 2017-12-22 DIAGNOSIS — I10 Essential (primary) hypertension: Secondary | ICD-10-CM

## 2017-12-22 DIAGNOSIS — E669 Obesity, unspecified: Secondary | ICD-10-CM

## 2017-12-22 MED ORDER — INSULIN ASPAR PROT-INSULIN ASPART 100 UNIT/ML (70-30) SUBCUTANEOUS PEN: mL | 12 refills | 0 days | Status: AC

## 2017-12-22 MED ORDER — PEN NEEDLE, DIABETIC 32 GAUGE X 5/32" (4 MM)
12 refills | 0 days | Status: CP
Start: 2017-12-22 — End: 2018-09-05

## 2017-12-22 MED ORDER — INSULIN ASPAR PROT-INSULIN ASPART 100 UNIT/ML (70-30) SUBCUTANEOUS PEN
12 refills | 0.00000 days | Status: CP
Start: 2017-12-22 — End: 2018-01-08

## 2018-01-08 MED ORDER — HUMULIN 70/30 U-100 INSULIN KWIKPEN 100 UNIT/ML SUBCUTANEOUS
3 refills | 0 days | Status: CP
Start: 2018-01-08 — End: 2018-01-12

## 2018-01-12 MED ORDER — HUMULIN 70/30 U-100 INSULIN KWIKPEN 100 UNIT/ML SUBCUTANEOUS
3 refills | 0 days | Status: SS
Start: 2018-01-12 — End: 2018-03-03

## 2018-01-18 ENCOUNTER — Encounter: Payer: Self-pay | Admitting: Emergency Medicine

## 2018-01-18 ENCOUNTER — Emergency Department: Payer: 59

## 2018-01-18 ENCOUNTER — Emergency Department
Admission: EM | Admit: 2018-01-18 | Discharge: 2018-01-18 | Disposition: A | Discharge status: Other Acute Inpatient Hospital | Payer: 59 | Source: Home / Self Care | Attending: Emergency Medicine | Admitting: Emergency Medicine

## 2018-01-18 ENCOUNTER — Observation Stay (HOSPITAL_COMMUNITY)
Admission: AD | Admit: 2018-01-18 | Discharge: 2018-01-20 | Disposition: A | Discharge status: Home / Self Care | Payer: 59 | Source: Other Acute Inpatient Hospital | Attending: Surgery | Admitting: Surgery

## 2018-01-18 DIAGNOSIS — R2681 Unsteadiness on feet: Secondary | ICD-10-CM | POA: Insufficient documentation

## 2018-01-18 DIAGNOSIS — N2889 Other specified disorders of kidney and ureter: Secondary | ICD-10-CM | POA: Diagnosis not present

## 2018-01-18 DIAGNOSIS — I2510 Atherosclerotic heart disease of native coronary artery without angina pectoris: Secondary | ICD-10-CM | POA: Insufficient documentation

## 2018-01-18 DIAGNOSIS — Z794 Long term (current) use of insulin: Secondary | ICD-10-CM | POA: Insufficient documentation

## 2018-01-18 DIAGNOSIS — S301XXA Contusion of abdominal wall, initial encounter: Secondary | ICD-10-CM | POA: Insufficient documentation

## 2018-01-18 DIAGNOSIS — Y92410 Unspecified street and highway as the place of occurrence of the external cause: Secondary | ICD-10-CM | POA: Insufficient documentation

## 2018-01-18 DIAGNOSIS — S2220XA Unspecified fracture of sternum, initial encounter for closed fracture: Secondary | ICD-10-CM | POA: Diagnosis not present

## 2018-01-18 DIAGNOSIS — Y998 Other external cause status: Secondary | ICD-10-CM

## 2018-01-18 DIAGNOSIS — Z79899 Other long term (current) drug therapy: Secondary | ICD-10-CM | POA: Diagnosis not present

## 2018-01-18 DIAGNOSIS — R51 Headache: Secondary | ICD-10-CM | POA: Insufficient documentation

## 2018-01-18 DIAGNOSIS — E119 Type 2 diabetes mellitus without complications: Secondary | ICD-10-CM | POA: Insufficient documentation

## 2018-01-18 DIAGNOSIS — S42209A Unspecified fracture of upper end of unspecified humerus, initial encounter for closed fracture: Secondary | ICD-10-CM

## 2018-01-18 DIAGNOSIS — M542 Cervicalgia: Secondary | ICD-10-CM | POA: Insufficient documentation

## 2018-01-18 DIAGNOSIS — Y9389 Activity, other specified: Secondary | ICD-10-CM | POA: Insufficient documentation

## 2018-01-18 DIAGNOSIS — R52 Pain, unspecified: Secondary | ICD-10-CM

## 2018-01-18 HISTORY — DX: Type 2 diabetes mellitus without complications: E11.9

## 2018-01-18 LAB — COMPREHENSIVE METABOLIC PANEL
ALK PHOS: 81 U/L (ref 38–126)
ALT: 15 U/L (ref 0–44)
ANION GAP: 10 (ref 5–15)
AST: 23 U/L (ref 15–41)
Albumin: 3.9 g/dL (ref 3.5–5.0)
BUN: 11 mg/dL (ref 6–20)
CALCIUM: 8.9 mg/dL (ref 8.9–10.3)
CHLORIDE: 102 mmol/L (ref 98–111)
CO2: 28 mmol/L (ref 22–32)
Creatinine, Ser: 0.56 mg/dL (ref 0.44–1.00)
GFR calc non Af Amer: >60 mL/min (ref >60)
Glucose, Bld: 213 mg/dL — ABNORMAL HIGH (ref 70–99)
POTASSIUM: 3.9 mmol/L (ref 3.5–5.1)
SODIUM: 140 mmol/L (ref 135–145)
Total Bilirubin: 0.5 mg/dL (ref 0.3–1.2)
Total Protein: 7.4 g/dL (ref 6.5–8.1)

## 2018-01-18 LAB — CBC
HCT: 38.9 % (ref 36.0–46.0)
HCT: 38 % (ref 36.0–46.0)
HEMOGLOBIN: 12.2 g/dL (ref 12.0–15.0)
Hemoglobin: 12.3 g/dL (ref 12.0–15.0)
MCH: 26.6 pg (ref 26.0–34.0)
MCH: 26.9 pg (ref 26.0–34.0)
MCHC: 31.6 g/dL (ref 30.0–36.0)
MCHC: 32.1 g/dL (ref 30.0–36.0)
MCV: 84.2 fL (ref 80.0–100.0)
MCV: 83.7 fL (ref 80.0–100.0)
PLATELETS: 216 10*3/uL (ref 150–400)
Platelets: 235 10*3/uL (ref 150–400)
RBC: 4.62 MIL/uL (ref 3.87–5.11)
RBC: 4.54 MIL/uL (ref 3.87–5.11)
RDW: 14.7 % (ref 11.5–15.5)
RDW: 14.6 % (ref 11.5–15.5)
WBC: 8 10*3/uL (ref 4.0–10.5)
WBC: 7.9 10*3/uL (ref 4.0–10.5)
nRBC: 0 % (ref 0.0–0.2)
nRBC: 0 % (ref 0.0–0.2)

## 2018-01-18 LAB — GLUCOSE, CAPILLARY: GLUCOSE-CAPILLARY: 218 mg/dL — AB (ref 70–99)

## 2018-01-18 LAB — CREATININE, SERUM
Creatinine, Ser: 0.68 mg/dL (ref 0.44–1.00)
GFR calc Af Amer: >60 mL/min (ref >60)
GFR calc non Af Amer: >60 mL/min (ref >60)

## 2018-01-18 LAB — LIPASE, BLOOD: Lipase: 29 U/L (ref 11–51)

## 2018-01-18 MED ORDER — MORPHINE SULFATE (PF) 2 MG/ML IV SOLN
2.0000 mg | Freq: Once | INTRAVENOUS | Status: AC
  Administered 2018-01-18: 2 mg via INTRAVENOUS
  Filled 2018-01-18: qty 1

## 2018-01-18 MED ORDER — KCL IN DEXTROSE-NACL 20-5-0.45 MEQ/L-%-% IV SOLN
INTRAVENOUS | Status: DC
  Administered 2018-01-18: 22:00:00 via INTRAVENOUS
  Filled 2018-01-18: qty 1000

## 2018-01-18 MED ORDER — ONDANSETRON HCL 4 MG/2ML IJ SOLN
4.0000 mg | Freq: Four times a day (QID) | INTRAMUSCULAR | Status: DC | PRN
  Administered 2018-01-19 – 2018-01-20 (×2): 4 mg via INTRAVENOUS
  Filled 2018-01-18 (×2): qty 2

## 2018-01-18 MED ORDER — HYDRALAZINE HCL 20 MG/ML IJ SOLN: 10.0000 mg | INTRAMUSCULAR | Status: DC | PRN

## 2018-01-18 MED ORDER — OXYCODONE HCL 5 MG PO TABS
10.0000 mg | ORAL_TABLET | ORAL | Status: DC | PRN
  Administered 2018-01-18 – 2018-01-20 (×6): 10 mg via ORAL
  Filled 2018-01-18 (×6): qty 2

## 2018-01-18 MED ORDER — FAMOTIDINE 20 MG PO TABS
20.0000 mg | ORAL_TABLET | Freq: Every day | ORAL | Status: DC
  Administered 2018-01-18 – 2018-01-20 (×3): 20 mg via ORAL
  Filled 2018-01-18 (×3): qty 1

## 2018-01-18 MED ORDER — OXYCODONE HCL 5 MG PO TABS
5.0000 mg | ORAL_TABLET | ORAL | Status: DC | PRN
  Administered 2018-01-19: 5 mg via ORAL
  Filled 2018-01-18: qty 1

## 2018-01-18 MED ORDER — METHOCARBAMOL 750 MG PO TABS
750.0000 mg | ORAL_TABLET | Freq: Three times a day (TID) | ORAL | Status: DC | PRN
  Administered 2018-01-19: 750 mg via ORAL
  Filled 2018-01-18: qty 1

## 2018-01-18 MED ORDER — INSULIN ASPART 100 UNIT/ML ~~LOC~~ SOLN
0.0000 [IU] | Freq: Every day | SUBCUTANEOUS | Status: DC
  Administered 2018-01-18: 2 [IU] via SUBCUTANEOUS

## 2018-01-18 MED ORDER — ENOXAPARIN SODIUM 40 MG/0.4ML ~~LOC~~ SOLN
40.0000 mg | SUBCUTANEOUS | Status: DC
  Administered 2018-01-19 – 2018-01-20 (×2): 40 mg via SUBCUTANEOUS
  Filled 2018-01-18 (×2): qty 0.4

## 2018-01-18 MED ORDER — IOPAMIDOL (ISOVUE-300) INJECTION 61%
100.0000 mL | Freq: Once | INTRAVENOUS | Status: AC | PRN
  Administered 2018-01-18: 100 mL via INTRAVENOUS

## 2018-01-18 MED ORDER — ACETAMINOPHEN 325 MG PO TABS: 650.0000 mg | ORAL_TABLET | ORAL | Status: DC | PRN

## 2018-01-18 MED ORDER — MORPHINE SULFATE (PF) 4 MG/ML IV SOLN
4.0000 mg | Freq: Once | INTRAVENOUS | Status: AC
  Administered 2018-01-18: 4 mg via INTRAVENOUS
  Filled 2018-01-18: qty 1

## 2018-01-18 MED ORDER — DOCUSATE SODIUM 100 MG PO CAPS
100.0000 mg | ORAL_CAPSULE | Freq: Two times a day (BID) | ORAL | Status: DC
  Administered 2018-01-18 – 2018-01-20 (×4): 100 mg via ORAL
  Filled 2018-01-18 (×4): qty 1

## 2018-01-18 MED ORDER — POTASSIUM CHLORIDE 2 MEQ/ML IV SOLN
INTRAVENOUS | Status: DC
  Filled 2018-01-18 (×4): qty 1000

## 2018-01-18 MED ORDER — INSULIN ASPART 100 UNIT/ML ~~LOC~~ SOLN
0.0000 [IU] | Freq: Three times a day (TID) | SUBCUTANEOUS | Status: DC
  Administered 2018-01-19: 2 [IU] via SUBCUTANEOUS
  Administered 2018-01-19: 5 [IU] via SUBCUTANEOUS
  Administered 2018-01-19: 3 [IU] via SUBCUTANEOUS
  Administered 2018-01-20: 2 [IU] via SUBCUTANEOUS

## 2018-01-18 MED ORDER — ACETAMINOPHEN 325 MG PO TABS
650.0000 mg | ORAL_TABLET | Freq: Once | ORAL | Status: AC
  Administered 2018-01-18: 650 mg via ORAL
  Filled 2018-01-18: qty 2

## 2018-01-18 MED ORDER — ONDANSETRON HCL 4 MG/2ML IJ SOLN
4.0000 mg | Freq: Once | INTRAMUSCULAR | Status: AC
  Administered 2018-01-18: 4 mg via INTRAVENOUS
  Filled 2018-01-18: qty 2

## 2018-01-18 MED ORDER — MORPHINE SULFATE (PF) 4 MG/ML IV SOLN: 4.0000 mg | INTRAVENOUS | Status: DC | PRN

## 2018-01-18 MED ORDER — ONDANSETRON 4 MG PO TBDP: 4.0000 mg | ORAL_TABLET | Freq: Four times a day (QID) | ORAL | Status: DC | PRN
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
## 2018-01-18 NOTE — ED Notes (Signed)
EMTALA reviewed. 

## 2018-01-18 NOTE — ED Notes (Signed)
ED Provider at bedside. 

## 2018-01-18 NOTE — ED Notes (Signed)
EMTALA reviewed by this RN.  

## 2018-01-18 NOTE — ED Notes (Signed)
FAST is negative. Completed by Fanny Bien MD.
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
## 2018-01-19 ENCOUNTER — Observation Stay (HOSPITAL_COMMUNITY): Payer: 59

## 2018-01-19 ENCOUNTER — Other Ambulatory Visit: Payer: Self-pay

## 2018-01-19 ENCOUNTER — Encounter (HOSPITAL_COMMUNITY): Payer: Self-pay | Admitting: General Practice

## 2018-01-19 LAB — CBC
HCT: 35.7 % — ABNORMAL LOW (ref 36.0–46.0)
HCT: 35.3 % — ABNORMAL LOW (ref 36.0–46.0)
HEMOGLOBIN: 11.3 g/dL — AB (ref 12.0–15.0)
HEMOGLOBIN: 11.2 g/dL — AB (ref 12.0–15.0)
MCH: 26.7 pg (ref 26.0–34.0)
MCH: 26.4 pg (ref 26.0–34.0)
MCHC: 31.7 g/dL (ref 30.0–36.0)
MCHC: 31.7 g/dL (ref 30.0–36.0)
MCV: 84.4 fL (ref 80.0–100.0)
MCV: 83.3 fL (ref 80.0–100.0)
NRBC: 0 % (ref 0.0–0.2)
Platelets: 210 10*3/uL (ref 150–400)
Platelets: 210 10*3/uL (ref 150–400)
RBC: 4.23 MIL/uL (ref 3.87–5.11)
RBC: 4.24 MIL/uL (ref 3.87–5.11)
RDW: 14.7 % (ref 11.5–15.5)
RDW: 14.6 % (ref 11.5–15.5)
WBC: 7.4 10*3/uL (ref 4.0–10.5)
WBC: 7.2 10*3/uL (ref 4.0–10.5)
nRBC: 0 % (ref 0.0–0.2)

## 2018-01-19 LAB — GLUCOSE, CAPILLARY
GLUCOSE-CAPILLARY: 158 mg/dL — AB (ref 70–99)
GLUCOSE-CAPILLARY: 202 mg/dL — AB (ref 70–99)
Glucose-Capillary: 148 mg/dL — ABNORMAL HIGH (ref 70–99)
Glucose-Capillary: 168 mg/dL — ABNORMAL HIGH (ref 70–99)
Glucose-Capillary: 177 mg/dL — ABNORMAL HIGH (ref 70–99)

## 2018-01-19 LAB — HIV ANTIBODY (ROUTINE TESTING W REFLEX): HIV SCREEN 4TH GENERATION: NONREACTIVE

## 2018-01-19 MED ORDER — IBUPROFEN 400 MG PO TABS: 400.0000 mg | ORAL_TABLET | Freq: Four times a day (QID) | ORAL | Status: DC | PRN

## 2018-01-19 MED ORDER — POLYETHYLENE GLYCOL 3350 17 G PO PACK
17.0000 g | PACK | Freq: Every day | ORAL | Status: DC
  Administered 2018-01-19 – 2018-01-20 (×2): 17 g via ORAL
  Filled 2018-01-19 (×2): qty 1

## 2018-01-19 MED ORDER — METHOCARBAMOL 750 MG PO TABS
750.0000 mg | ORAL_TABLET | Freq: Three times a day (TID) | ORAL | Status: DC
  Administered 2018-01-19 – 2018-01-20 (×4): 750 mg via ORAL
  Filled 2018-01-19 (×4): qty 1

## 2018-01-19 MED ORDER — ACETAMINOPHEN 500 MG PO TABS
1000.0000 mg | ORAL_TABLET | Freq: Four times a day (QID) | ORAL | Status: DC
  Administered 2018-01-19 – 2018-01-20 (×4): 1000 mg via ORAL
  Filled 2018-01-19 (×5): qty 2
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
## 2018-01-19 NOTE — Plan of Care (Signed)
  Problem: Elimination: Goal: Will not experience complications related to bowel motility Outcome: Progressing Goal: Will not experience complications related to urinary retention Outcome: Progressing   Problem: Skin Integrity: Goal: Risk for impaired skin integrity will decrease Outcome: Progressing   
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
## 2018-01-20 LAB — CBC
HCT: 36.1 % (ref 36.0–46.0)
Hemoglobin: 11.1 g/dL — ABNORMAL LOW (ref 12.0–15.0)
MCH: 25.9 pg — ABNORMAL LOW (ref 26.0–34.0)
MCHC: 30.7 g/dL (ref 30.0–36.0)
MCV: 84.1 fL (ref 80.0–100.0)
Platelets: 215 10*3/uL (ref 150–400)
RBC: 4.29 MIL/uL (ref 3.87–5.11)
RDW: 14.6 % (ref 11.5–15.5)
WBC: 6.6 10*3/uL (ref 4.0–10.5)
nRBC: 0 % (ref 0.0–0.2)

## 2018-01-20 LAB — BASIC METABOLIC PANEL
ANION GAP: 8 (ref 5–15)
BUN: 10 mg/dL (ref 6–20)
CO2: 28 mmol/L (ref 22–32)
CREATININE: 0.76 mg/dL (ref 0.44–1.00)
Calcium: 8.7 mg/dL — ABNORMAL LOW (ref 8.9–10.3)
Chloride: 102 mmol/L (ref 98–111)
Glucose, Bld: 148 mg/dL — ABNORMAL HIGH (ref 70–99)
Potassium: 4.2 mmol/L (ref 3.5–5.1)
SODIUM: 138 mmol/L (ref 135–145)

## 2018-01-20 LAB — GLUCOSE, CAPILLARY: Glucose-Capillary: 136 mg/dL — ABNORMAL HIGH (ref 70–99)

## 2018-01-20 MED ORDER — METHOCARBAMOL 750 MG PO TABS: 750.0000 mg | ORAL_TABLET | Freq: Three times a day (TID) | ORAL | 0 refills | Status: DC

## 2018-01-20 MED ORDER — OXYCODONE HCL 5 MG PO TABS: 5.0000 mg | ORAL_TABLET | Freq: Four times a day (QID) | ORAL | 0 refills | Status: DC | PRN
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
## 2018-01-30 ENCOUNTER — Ambulatory Visit
Admit: 2018-01-30 | Discharge: 2018-01-31 | Payer: PRIVATE HEALTH INSURANCE | Attending: Student in an Organized Health Care Education/Training Program | Primary: Student in an Organized Health Care Education/Training Program

## 2018-01-30 DIAGNOSIS — H40003 Preglaucoma, unspecified, bilateral: Principal | ICD-10-CM

## 2018-02-02 ENCOUNTER — Encounter: Admit: 2018-02-02 | Discharge: 2018-02-03 | Payer: PRIVATE HEALTH INSURANCE

## 2018-02-02 DIAGNOSIS — S2220XS Unspecified fracture of sternum, sequela: Principal | ICD-10-CM

## 2018-02-02 DIAGNOSIS — S301XXS Contusion of abdominal wall, sequela: Secondary | ICD-10-CM

## 2018-02-02 MED ORDER — METHOCARBAMOL 750 MG TABLET
ORAL_TABLET | Freq: Three times a day (TID) | ORAL | 0 refills | 0 days | Status: CP
Start: 2018-02-02 — End: ?

## 2018-02-02 MED ORDER — OXYCODONE 5 MG TABLET
ORAL_TABLET | 0 refills | 0 days | Status: CP
Start: 2018-02-02 — End: 2018-03-12

## 2018-02-09 ENCOUNTER — Encounter: Admit: 2018-02-09 | Discharge: 2018-02-10 | Payer: PRIVATE HEALTH INSURANCE

## 2018-02-09 DIAGNOSIS — S301XXS Contusion of abdominal wall, sequela: Secondary | ICD-10-CM

## 2018-02-09 DIAGNOSIS — F419 Anxiety disorder, unspecified: Secondary | ICD-10-CM

## 2018-02-09 DIAGNOSIS — S2220XS Unspecified fracture of sternum, sequela: Principal | ICD-10-CM

## 2018-02-09 DIAGNOSIS — F430 Acute stress reaction: Secondary | ICD-10-CM

## 2018-02-09 MED ORDER — ESCITALOPRAM 5 MG TABLET
ORAL_TABLET | Freq: Every day | ORAL | 5 refills | 0 days | Status: CP
Start: 2018-02-09 — End: 2018-09-05

## 2018-02-16 ENCOUNTER — Encounter: Admit: 2018-02-16 | Discharge: 2018-02-17 | Payer: PRIVATE HEALTH INSURANCE

## 2018-02-16 DIAGNOSIS — T148XXA Other injury of unspecified body region, initial encounter: Principal | ICD-10-CM

## 2018-02-20 ENCOUNTER — Encounter: Admit: 2018-02-20 | Discharge: 2018-02-20 | Payer: PRIVATE HEALTH INSURANCE | Attending: Surgery | Primary: Surgery

## 2018-02-20 ENCOUNTER — Encounter: Admit: 2018-02-20 | Discharge: 2018-02-20 | Payer: PRIVATE HEALTH INSURANCE

## 2018-02-20 DIAGNOSIS — S301XXS Contusion of abdominal wall, sequela: Secondary | ICD-10-CM

## 2018-02-20 DIAGNOSIS — S2222XG Fracture of body of sternum, subsequent encounter for fracture with delayed healing: Principal | ICD-10-CM

## 2018-02-20 DIAGNOSIS — S2220XS Unspecified fracture of sternum, sequela: Principal | ICD-10-CM

## 2018-02-28 DIAGNOSIS — S2222XG Fracture of body of sternum, subsequent encounter for fracture with delayed healing: Principal | ICD-10-CM

## 2018-03-01 ENCOUNTER — Encounter: Admit: 2018-03-01 | Discharge: 2018-03-01 | Payer: PRIVATE HEALTH INSURANCE

## 2018-03-01 DIAGNOSIS — S2222XG Fracture of body of sternum, subsequent encounter for fracture with delayed healing: Principal | ICD-10-CM

## 2018-03-01 MED ORDER — ACETAMINOPHEN 500 MG TABLET
ORAL_TABLET | Freq: Three times a day (TID) | ORAL | 0 refills | 0.00000 days | Status: CP
Start: 2018-03-01 — End: 2018-03-15

## 2018-03-01 MED ORDER — ASPIRIN 81 MG TABLET,DELAYED RELEASE
ORAL_TABLET | Freq: Every day | ORAL | 0 refills | 0.00000 days
Start: 2018-03-01 — End: 2018-04-06

## 2018-03-01 MED ORDER — OXYCODONE 5 MG TABLET
ORAL_TABLET | ORAL | 0 refills | 0.00000 days | Status: CP
Start: 2018-03-01 — End: 2019-03-10

## 2018-03-05 MED ORDER — HUMULIN 70/30 U-100 INSULIN KWIKPEN 100 UNIT/ML SUBCUTANEOUS
1 refills | 0 days | Status: CP
Start: 2018-03-05 — End: 2018-10-19

## 2018-03-09 ENCOUNTER — Encounter: Admit: 2018-03-09 | Discharge: 2018-03-10 | Payer: PRIVATE HEALTH INSURANCE

## 2018-03-09 DIAGNOSIS — S2220XS Unspecified fracture of sternum, sequela: Secondary | ICD-10-CM

## 2018-03-09 DIAGNOSIS — F418 Other specified anxiety disorders: Secondary | ICD-10-CM

## 2018-03-13 ENCOUNTER — Encounter
Admit: 2018-03-13 | Discharge: 2018-03-14 | Payer: PRIVATE HEALTH INSURANCE | Attending: Adult Health | Primary: Adult Health

## 2018-03-13 ENCOUNTER — Encounter: Admit: 2018-03-13 | Discharge: 2018-03-14 | Payer: PRIVATE HEALTH INSURANCE

## 2018-03-13 DIAGNOSIS — Z09 Encounter for follow-up examination after completed treatment for conditions other than malignant neoplasm: Principal | ICD-10-CM

## 2018-03-13 DIAGNOSIS — S2220XK Unspecified fracture of sternum, subsequent encounter for fracture with nonunion: Principal | ICD-10-CM

## 2018-03-22 MED ORDER — LANCETS
1 refills | 0 days | Status: CP
Start: 2018-03-22 — End: 2018-05-04

## 2018-03-22 MED ORDER — BLOOD-GLUCOSE METER KIT
0 refills | 0 days | Status: CP
Start: 2018-03-22 — End: 2019-03-23

## 2018-03-22 MED ORDER — BLOOD SUGAR DIAGNOSTIC STRIPS
ORAL_STRIP | 1 refills | 0 days | Status: CP
Start: 2018-03-22 — End: 2018-04-09

## 2018-03-27 ENCOUNTER — Encounter: Admit: 2018-03-27 | Discharge: 2018-03-28 | Payer: PRIVATE HEALTH INSURANCE

## 2018-03-27 ENCOUNTER — Encounter
Admit: 2018-03-27 | Discharge: 2018-03-28 | Payer: PRIVATE HEALTH INSURANCE | Attending: Nurse Practitioner | Primary: Nurse Practitioner

## 2018-03-27 DIAGNOSIS — Z09 Encounter for follow-up examination after completed treatment for conditions other than malignant neoplasm: Principal | ICD-10-CM

## 2018-03-27 DIAGNOSIS — S2222XK Fracture of body of sternum, subsequent encounter for fracture with nonunion: Principal | ICD-10-CM

## 2018-04-05 ENCOUNTER — Encounter: Admit: 2018-04-05 | Discharge: 2018-04-06 | Payer: BLUE CROSS/BLUE SHIELD

## 2018-04-05 ENCOUNTER — Encounter
Admit: 2018-04-05 | Discharge: 2018-04-06 | Payer: PRIVATE HEALTH INSURANCE | Attending: Registered" | Primary: Registered"

## 2018-04-05 DIAGNOSIS — E113513 Type 2 diabetes mellitus with proliferative diabetic retinopathy with macular edema, bilateral: Principal | ICD-10-CM

## 2018-04-06 ENCOUNTER — Encounter: Admit: 2018-04-06 | Discharge: 2018-04-07 | Payer: PRIVATE HEALTH INSURANCE

## 2018-04-06 DIAGNOSIS — E785 Hyperlipidemia, unspecified: Secondary | ICD-10-CM

## 2018-04-06 DIAGNOSIS — E11319 Type 2 diabetes mellitus with unspecified diabetic retinopathy without macular edema: Principal | ICD-10-CM

## 2018-04-06 DIAGNOSIS — Z794 Long term (current) use of insulin: Secondary | ICD-10-CM

## 2018-04-06 DIAGNOSIS — I10 Essential (primary) hypertension: Secondary | ICD-10-CM

## 2018-04-06 DIAGNOSIS — E669 Obesity, unspecified: Secondary | ICD-10-CM

## 2018-04-09 MED ORDER — BLOOD SUGAR DIAGNOSTIC STRIPS
ORAL_STRIP | 1 refills | 0 days | Status: CP
Start: 2018-04-09 — End: ?

## 2018-04-13 ENCOUNTER — Encounter: Admit: 2018-04-13 | Discharge: 2018-04-14 | Payer: PRIVATE HEALTH INSURANCE

## 2018-04-13 DIAGNOSIS — M25511 Pain in right shoulder: Principal | ICD-10-CM

## 2018-04-13 DIAGNOSIS — S43421A Sprain of right rotator cuff capsule, initial encounter: Principal | ICD-10-CM

## 2018-04-13 DIAGNOSIS — M25611 Stiffness of right shoulder, not elsewhere classified: Secondary | ICD-10-CM

## 2018-04-20 ENCOUNTER — Encounter
Admit: 2018-04-20 | Discharge: 2018-04-21 | Payer: PRIVATE HEALTH INSURANCE | Attending: Adult Health | Primary: Adult Health

## 2018-04-20 DIAGNOSIS — Z09 Encounter for follow-up examination after completed treatment for conditions other than malignant neoplasm: Principal | ICD-10-CM

## 2018-04-27 ENCOUNTER — Encounter: Admit: 2018-04-27 | Discharge: 2018-04-28 | Payer: PRIVATE HEALTH INSURANCE

## 2018-04-27 DIAGNOSIS — M25611 Stiffness of right shoulder, not elsewhere classified: Secondary | ICD-10-CM

## 2018-04-27 DIAGNOSIS — S43421D Sprain of right rotator cuff capsule, subsequent encounter: Principal | ICD-10-CM

## 2018-05-04 ENCOUNTER — Encounter: Admit: 2018-05-04 | Discharge: 2018-05-05 | Payer: PRIVATE HEALTH INSURANCE

## 2018-05-04 DIAGNOSIS — S43421D Sprain of right rotator cuff capsule, subsequent encounter: Principal | ICD-10-CM

## 2018-05-04 DIAGNOSIS — M25611 Stiffness of right shoulder, not elsewhere classified: Secondary | ICD-10-CM

## 2018-05-04 MED ORDER — LANCETS
1 refills | 0 days | Status: CP
Start: 2018-05-04 — End: 2019-05-05

## 2018-05-07 ENCOUNTER — Encounter: Admit: 2018-05-07 | Discharge: 2018-05-08 | Payer: PRIVATE HEALTH INSURANCE

## 2018-05-07 DIAGNOSIS — S43421D Sprain of right rotator cuff capsule, subsequent encounter: Principal | ICD-10-CM

## 2018-05-07 MED ORDER — DICLOFENAC SODIUM 75 MG TABLET,DELAYED RELEASE
ORAL_TABLET | Freq: Two times a day (BID) | ORAL | 0 refills | 0.00000 days | Status: CP
Start: 2018-05-07 — End: 2018-05-21

## 2018-05-08 ENCOUNTER — Encounter: Admit: 2018-05-08 | Discharge: 2018-05-09 | Payer: PRIVATE HEALTH INSURANCE

## 2018-05-08 DIAGNOSIS — I10 Essential (primary) hypertension: Secondary | ICD-10-CM

## 2018-05-08 DIAGNOSIS — M25511 Pain in right shoulder: Secondary | ICD-10-CM

## 2018-05-08 DIAGNOSIS — L732 Hidradenitis suppurativa: Secondary | ICD-10-CM

## 2018-05-08 DIAGNOSIS — Z794 Long term (current) use of insulin: Secondary | ICD-10-CM

## 2018-05-08 DIAGNOSIS — E785 Hyperlipidemia, unspecified: Secondary | ICD-10-CM

## 2018-05-08 DIAGNOSIS — H4312 Vitreous hemorrhage, left eye: Secondary | ICD-10-CM

## 2018-05-08 DIAGNOSIS — F419 Anxiety disorder, unspecified: Secondary | ICD-10-CM

## 2018-05-08 DIAGNOSIS — E11319 Type 2 diabetes mellitus with unspecified diabetic retinopathy without macular edema: Principal | ICD-10-CM

## 2018-05-08 DIAGNOSIS — S2220XD Unspecified fracture of sternum, subsequent encounter for fracture with routine healing: Secondary | ICD-10-CM

## 2018-05-23 MED ORDER — DOXYCYCLINE MONOHYDRATE 100 MG CAPSULE
ORAL_CAPSULE | Freq: Two times a day (BID) | ORAL | 0 refills | 0.00000 days | Status: CP
Start: 2018-05-23 — End: 2018-09-05

## 2018-05-28 ENCOUNTER — Encounter: Admit: 2018-05-28 | Discharge: 2018-05-29 | Payer: PRIVATE HEALTH INSURANCE

## 2018-05-28 DIAGNOSIS — H04123 Dry eye syndrome of bilateral lacrimal glands: Principal | ICD-10-CM

## 2018-05-28 DIAGNOSIS — S4990XA Unspecified injury of shoulder and upper arm, unspecified arm, initial encounter: Principal | ICD-10-CM

## 2018-05-28 DIAGNOSIS — A159 Respiratory tuberculosis unspecified: Principal | ICD-10-CM

## 2018-05-28 DIAGNOSIS — H269 Unspecified cataract: Principal | ICD-10-CM

## 2018-05-28 DIAGNOSIS — E119 Type 2 diabetes mellitus without complications: Principal | ICD-10-CM

## 2018-05-28 DIAGNOSIS — E11319 Type 2 diabetes mellitus with unspecified diabetic retinopathy without macular edema: Principal | ICD-10-CM

## 2018-05-28 DIAGNOSIS — R112 Nausea with vomiting, unspecified: Principal | ICD-10-CM

## 2018-05-28 DIAGNOSIS — F329 Major depressive disorder, single episode, unspecified: Principal | ICD-10-CM

## 2018-05-28 DIAGNOSIS — T07XXXA Unspecified multiple injuries, initial encounter: Principal | ICD-10-CM

## 2018-05-28 DIAGNOSIS — I10 Essential (primary) hypertension: Principal | ICD-10-CM

## 2018-05-28 DIAGNOSIS — M5412 Radiculopathy, cervical region: Principal | ICD-10-CM

## 2018-05-28 DIAGNOSIS — Z9889 Other specified postprocedural states: Principal | ICD-10-CM

## 2018-05-28 DIAGNOSIS — H409 Unspecified glaucoma: Principal | ICD-10-CM

## 2018-05-28 MED ORDER — CELECOXIB 100 MG CAPSULE
ORAL_CAPSULE | Freq: Two times a day (BID) | ORAL | 2 refills | 0 days | Status: CP
Start: 2018-05-28 — End: 2018-08-26

## 2018-05-28 MED ORDER — METHYLPREDNISOLONE 4 MG TABLETS IN A DOSE PACK
ORAL_TABLET | 0 refills | 0 days | Status: CP
Start: 2018-05-28 — End: ?

## 2018-06-08 ENCOUNTER — Ambulatory Visit: Admit: 2018-06-08 | Discharge: 2018-06-09 | Payer: PRIVATE HEALTH INSURANCE

## 2018-06-08 DIAGNOSIS — H04123 Dry eye syndrome of bilateral lacrimal glands: Principal | ICD-10-CM

## 2018-06-08 DIAGNOSIS — H269 Unspecified cataract: Principal | ICD-10-CM

## 2018-06-08 DIAGNOSIS — T07XXXA Unspecified multiple injuries, initial encounter: Principal | ICD-10-CM

## 2018-06-08 DIAGNOSIS — A159 Respiratory tuberculosis unspecified: Principal | ICD-10-CM

## 2018-06-08 DIAGNOSIS — E113513 Type 2 diabetes mellitus with proliferative diabetic retinopathy with macular edema, bilateral: Principal | ICD-10-CM

## 2018-06-08 DIAGNOSIS — E11319 Type 2 diabetes mellitus with unspecified diabetic retinopathy without macular edema: Principal | ICD-10-CM

## 2018-06-08 DIAGNOSIS — I10 Essential (primary) hypertension: Principal | ICD-10-CM

## 2018-06-08 DIAGNOSIS — R112 Nausea with vomiting, unspecified: Principal | ICD-10-CM

## 2018-06-08 DIAGNOSIS — E119 Type 2 diabetes mellitus without complications: Principal | ICD-10-CM

## 2018-06-08 DIAGNOSIS — S4990XA Unspecified injury of shoulder and upper arm, unspecified arm, initial encounter: Principal | ICD-10-CM

## 2018-06-08 DIAGNOSIS — H409 Unspecified glaucoma: Principal | ICD-10-CM

## 2018-06-08 DIAGNOSIS — Z9889 Other specified postprocedural states: Principal | ICD-10-CM

## 2018-06-08 DIAGNOSIS — F329 Major depressive disorder, single episode, unspecified: Principal | ICD-10-CM

## 2018-07-13 ENCOUNTER — Institutional Professional Consult (permissible substitution): Admit: 2018-07-13 | Discharge: 2018-07-14

## 2018-07-13 DIAGNOSIS — E11319 Type 2 diabetes mellitus with unspecified diabetic retinopathy without macular edema: Principal | ICD-10-CM

## 2018-07-13 DIAGNOSIS — Z794 Long term (current) use of insulin: Secondary | ICD-10-CM

## 2018-07-23 ENCOUNTER — Institutional Professional Consult (permissible substitution): Admit: 2018-07-23 | Discharge: 2018-07-24

## 2018-07-23 DIAGNOSIS — Z794 Long term (current) use of insulin: Secondary | ICD-10-CM

## 2018-07-23 DIAGNOSIS — E11319 Type 2 diabetes mellitus with unspecified diabetic retinopathy without macular edema: Principal | ICD-10-CM

## 2018-09-04 ENCOUNTER — Ambulatory Visit: Admit: 2018-09-04 | Discharge: 2018-09-05

## 2018-09-04 DIAGNOSIS — S46011D Strain of muscle(s) and tendon(s) of the rotator cuff of right shoulder, subsequent encounter: Principal | ICD-10-CM

## 2018-09-05 MED ORDER — DOXYCYCLINE MONOHYDRATE 100 MG CAPSULE
ORAL_CAPSULE | Freq: Every day | ORAL | 0 refills | 0.00000 days | Status: CP
Start: 2018-09-05 — End: 2018-09-21

## 2018-09-05 MED ORDER — LOSARTAN 25 MG TABLET
ORAL_TABLET | Freq: Every day | ORAL | 3 refills | 0 days | Status: CP
Start: 2018-09-05 — End: 2018-09-14

## 2018-09-05 MED ORDER — METFORMIN 1,000 MG TABLET
ORAL_TABLET | Freq: Two times a day (BID) | ORAL | 3 refills | 0 days | Status: CP
Start: 2018-09-05 — End: 2018-09-14

## 2018-09-05 MED ORDER — CANAGLIFLOZIN 300 MG TABLET
ORAL_TABLET | Freq: Every morning | ORAL | 3 refills | 0 days | Status: CP
Start: 2018-09-05 — End: 2018-09-14

## 2018-09-05 MED ORDER — PEN NEEDLE, DIABETIC 32 GAUGE X 5/32" (4 MM)
12 refills | 0 days | Status: CP
Start: 2018-09-05 — End: ?
  Filled 2018-09-21: qty 100, 50d supply, fill #0

## 2018-09-05 MED ORDER — ATORVASTATIN 20 MG TABLET
ORAL_TABLET | Freq: Every day | ORAL | 3 refills | 0.00000 days | Status: CP
Start: 2018-09-05 — End: 2018-09-14

## 2018-09-14 MED ORDER — LOSARTAN 25 MG TABLET
ORAL_TABLET | Freq: Every day | ORAL | 3 refills | 90 days | Status: CP
Start: 2018-09-14 — End: 2018-11-27
  Filled 2018-09-21: qty 30, 30d supply, fill #0

## 2018-09-14 MED ORDER — ATORVASTATIN 20 MG TABLET
ORAL_TABLET | Freq: Every day | ORAL | 3 refills | 90.00000 days | Status: CP
Start: 2018-09-14 — End: 2019-09-14
  Filled 2018-09-21: qty 30, 30d supply, fill #0

## 2018-09-14 MED ORDER — OMEPRAZOLE 20 MG CAPSULE,DELAYED RELEASE
ORAL_CAPSULE | Freq: Every day | ORAL | 3 refills | 90.00000 days | Status: CP
Start: 2018-09-14 — End: 2019-09-14
  Filled 2018-09-21: qty 30, 30d supply, fill #0

## 2018-09-14 MED ORDER — METFORMIN 1,000 MG TABLET
ORAL_TABLET | Freq: Two times a day (BID) | ORAL | 3 refills | 90.00000 days | Status: CP
Start: 2018-09-14 — End: 2019-09-14
  Filled 2018-09-21: qty 60, 30d supply, fill #0

## 2018-09-14 MED ORDER — CANAGLIFLOZIN 300 MG TABLET
ORAL_TABLET | Freq: Every morning | ORAL | 3 refills | 90 days | Status: CP
Start: 2018-09-14 — End: 2019-09-14
  Filled 2018-09-21: qty 30, 30d supply, fill #0

## 2018-09-21 ENCOUNTER — Ambulatory Visit: Admit: 2018-09-21 | Discharge: 2018-09-22

## 2018-09-21 DIAGNOSIS — E113513 Type 2 diabetes mellitus with proliferative diabetic retinopathy with macular edema, bilateral: Principal | ICD-10-CM

## 2018-09-21 MED ORDER — DOXYCYCLINE HYCLATE 100 MG CAPSULE
ORAL_CAPSULE | Freq: Every day | ORAL | 0 refills | 0 days | Status: CP
Start: 2018-09-21 — End: ?

## 2018-09-21 MED FILL — ATORVASTATIN 20 MG TABLET: 30 days supply | Qty: 30 | Fill #0 | Status: AC

## 2018-09-21 MED FILL — LOSARTAN 25 MG TABLET: 30 days supply | Qty: 30 | Fill #0 | Status: AC

## 2018-09-21 MED FILL — INVOKANA 300 MG TABLET: 30 days supply | Qty: 30 | Fill #0 | Status: AC

## 2018-09-21 MED FILL — METFORMIN 1,000 MG TABLET: 30 days supply | Qty: 60 | Fill #0 | Status: AC

## 2018-09-21 MED FILL — BD ULTRA-FINE NANO PEN NEEDLE 32 GAUGE X 5/32" (4 MM): 50 days supply | Qty: 100 | Fill #0 | Status: AC

## 2018-09-21 MED FILL — OMEPRAZOLE 20 MG CAPSULE,DELAYED RELEASE: 30 days supply | Qty: 30 | Fill #0 | Status: AC

## 2018-10-12 ENCOUNTER — Ambulatory Visit
Admit: 2018-10-12 | Discharge: 2018-10-13 | Attending: Student in an Organized Health Care Education/Training Program | Primary: Student in an Organized Health Care Education/Training Program

## 2018-10-12 DIAGNOSIS — H4051X3 Glaucoma secondary to other eye disorders, right eye, severe stage: Principal | ICD-10-CM

## 2018-10-18 MED FILL — LOSARTAN 25 MG TABLET: 90 days supply | Qty: 90 | Fill #1 | Status: AC

## 2018-10-18 MED FILL — ATORVASTATIN 20 MG TABLET: ORAL | 90 days supply | Qty: 90 | Fill #1

## 2018-10-18 MED FILL — INVOKANA 300 MG TABLET: ORAL | 90 days supply | Qty: 90 | Fill #1

## 2018-10-18 MED FILL — OMEPRAZOLE 20 MG CAPSULE,DELAYED RELEASE: ORAL | 90 days supply | Qty: 90 | Fill #1

## 2018-10-18 MED FILL — OMEPRAZOLE 20 MG CAPSULE,DELAYED RELEASE: 90 days supply | Qty: 90 | Fill #1 | Status: AC

## 2018-10-18 MED FILL — METFORMIN 1,000 MG TABLET: ORAL | 90 days supply | Qty: 180 | Fill #1

## 2018-10-18 MED FILL — INVOKANA 300 MG TABLET: 90 days supply | Qty: 90 | Fill #1 | Status: AC

## 2018-10-18 MED FILL — LOSARTAN 25 MG TABLET: ORAL | 90 days supply | Qty: 90 | Fill #1

## 2018-10-18 MED FILL — ATORVASTATIN 20 MG TABLET: 90 days supply | Qty: 90 | Fill #1 | Status: AC

## 2018-10-18 MED FILL — METFORMIN 1,000 MG TABLET: 90 days supply | Qty: 180 | Fill #1 | Status: AC

## 2018-10-19 ENCOUNTER — Telehealth: Admit: 2018-10-19 | Discharge: 2018-10-20

## 2018-10-19 DIAGNOSIS — E11319 Type 2 diabetes mellitus with unspecified diabetic retinopathy without macular edema: Principal | ICD-10-CM

## 2018-10-19 DIAGNOSIS — Z794 Long term (current) use of insulin: Secondary | ICD-10-CM

## 2018-10-19 DIAGNOSIS — I10 Essential (primary) hypertension: Secondary | ICD-10-CM

## 2018-10-19 DIAGNOSIS — E785 Hyperlipidemia, unspecified: Secondary | ICD-10-CM

## 2018-10-19 MED ORDER — HUMULIN 70/30 U-100 INSULIN KWIKPEN 100 UNIT/ML SUBCUTANEOUS
1 refills | 0 days | Status: CP
Start: 2018-10-19 — End: 2018-10-22

## 2018-10-22 MED ORDER — INSULIN HUMAN U-100 NPH-REGULR 70-30 MIX 100 UNIT/ML SUBCUTANEOUS SUSP
6 refills | 0 days | Status: CP
Start: 2018-10-22 — End: 2018-10-22

## 2018-10-22 MED ORDER — INSULIN ASPAR PROT-INSULIN ASPART 100 UNIT/ML (70-30) SUBCUTANEOUS PEN
4 refills | 0 days | Status: CP
Start: 2018-10-22 — End: ?
  Filled 2018-10-23: qty 60, 75d supply, fill #0

## 2018-10-23 MED FILL — NOVOLOG MIX 70-30 FLEXPEN U-100 INSULIN 100 UNIT/ML SUBCUTANEOUS PEN: 75 days supply | Qty: 60 | Fill #0 | Status: AC

## 2018-11-21 MED FILL — ULTICARE PEN NEEDLE 32 GAUGE X 5/32" (4 MM): 50 days supply | Qty: 100 | Fill #1

## 2018-11-21 MED FILL — ULTICARE PEN NEEDLE 32 GAUGE X 5/32": 50 days supply | Qty: 100 | Fill #1 | Status: AC

## 2018-11-23 ENCOUNTER — Ambulatory Visit
Admit: 2018-11-23 | Discharge: 2018-11-24 | Attending: Physical Medicine & Rehabilitation | Primary: Physical Medicine & Rehabilitation

## 2018-11-23 DIAGNOSIS — M25511 Pain in right shoulder: Secondary | ICD-10-CM

## 2018-11-23 DIAGNOSIS — M7918 Myalgia, other site: Principal | ICD-10-CM

## 2018-11-23 DIAGNOSIS — G8929 Other chronic pain: Secondary | ICD-10-CM

## 2018-11-23 DIAGNOSIS — M542 Cervicalgia: Secondary | ICD-10-CM

## 2018-11-27 ENCOUNTER — Ambulatory Visit: Admit: 2018-11-27 | Discharge: 2018-11-28

## 2018-11-27 DIAGNOSIS — I10 Essential (primary) hypertension: Secondary | ICD-10-CM

## 2018-11-27 DIAGNOSIS — R82998 Other abnormal findings in urine: Secondary | ICD-10-CM

## 2018-11-27 DIAGNOSIS — Z794 Long term (current) use of insulin: Secondary | ICD-10-CM

## 2018-11-27 DIAGNOSIS — H9312 Tinnitus, left ear: Secondary | ICD-10-CM

## 2018-11-27 DIAGNOSIS — E11319 Type 2 diabetes mellitus with unspecified diabetic retinopathy without macular edema: Secondary | ICD-10-CM

## 2018-11-27 DIAGNOSIS — Z1239 Encounter for other screening for malignant neoplasm of breast: Secondary | ICD-10-CM

## 2018-11-27 DIAGNOSIS — E785 Hyperlipidemia, unspecified: Secondary | ICD-10-CM

## 2018-11-27 DIAGNOSIS — Z1159 Encounter for screening for other viral diseases: Secondary | ICD-10-CM

## 2018-11-27 MED ORDER — FLUTICASONE PROPIONATE 50 MCG/ACTUATION NASAL SPRAY,SUSPENSION
Freq: Every day | NASAL | 0 refills | 0 days | Status: CP
Start: 2018-11-27 — End: 2019-11-27
  Filled 2018-11-28: qty 16, 60d supply, fill #0

## 2018-11-27 MED ORDER — LOSARTAN 25 MG TABLET
ORAL_TABLET | Freq: Every day | ORAL | 3 refills | 45.00000 days | Status: CP
Start: 2018-11-27 — End: 2019-11-27
  Filled 2018-11-28: qty 90, 45d supply, fill #0

## 2018-11-28 ENCOUNTER — Other Ambulatory Visit: Admit: 2018-11-28 | Discharge: 2018-11-29

## 2018-11-28 DIAGNOSIS — I10 Essential (primary) hypertension: Secondary | ICD-10-CM

## 2018-11-28 MED FILL — LOSARTAN 25 MG TABLET: 45 days supply | Qty: 90 | Fill #0 | Status: AC

## 2018-11-28 MED FILL — FLUTICASONE PROPIONATE 50 MCG/ACTUATION NASAL SPRAY,SUSPENSION: 60 days supply | Qty: 16 | Fill #0 | Status: AC

## 2018-12-25 ENCOUNTER — Ambulatory Visit: Admit: 2018-12-25 | Discharge: 2018-12-26

## 2018-12-25 DIAGNOSIS — Z794 Long term (current) use of insulin: Secondary | ICD-10-CM

## 2018-12-25 DIAGNOSIS — E11319 Type 2 diabetes mellitus with unspecified diabetic retinopathy without macular edema: Principal | ICD-10-CM

## 2018-12-25 DIAGNOSIS — I10 Essential (primary) hypertension: Secondary | ICD-10-CM

## 2018-12-25 DIAGNOSIS — E785 Hyperlipidemia, unspecified: Secondary | ICD-10-CM

## 2019-01-09 MED FILL — LOSARTAN 25 MG TABLET: 35 days supply | Qty: 70 | Fill #1 | Status: AC

## 2019-01-09 MED FILL — ATORVASTATIN 20 MG TABLET: ORAL | 90 days supply | Qty: 90 | Fill #2

## 2019-01-09 MED FILL — LOSARTAN 25 MG TABLET: ORAL | 35 days supply | Qty: 70 | Fill #1

## 2019-01-09 MED FILL — INVOKANA 300 MG TABLET: 90 days supply | Qty: 90 | Fill #2 | Status: AC

## 2019-01-09 MED FILL — ATORVASTATIN 20 MG TABLET: 90 days supply | Qty: 90 | Fill #2 | Status: AC

## 2019-01-09 MED FILL — NOVOLOG MIX 70-30 FLEXPEN U-100 INSULIN 100 UNIT/ML SUBCUTANEOUS PEN: 75 days supply | Qty: 60 | Fill #1 | Status: AC

## 2019-01-09 MED FILL — NOVOLOG MIX 70-30 FLEXPEN U-100 INSULIN 100 UNIT/ML SUBCUTANEOUS PEN: 75 days supply | Qty: 60 | Fill #1

## 2019-01-09 MED FILL — INVOKANA 300 MG TABLET: ORAL | 90 days supply | Qty: 90 | Fill #2

## 2019-02-26 MED FILL — OMEPRAZOLE 20 MG CAPSULE,DELAYED RELEASE: ORAL | 90 days supply | Qty: 90 | Fill #2

## 2019-02-26 MED FILL — OMEPRAZOLE 20 MG CAPSULE,DELAYED RELEASE: 90 days supply | Qty: 90 | Fill #2 | Status: AC

## 2019-04-19 ENCOUNTER — Ambulatory Visit: Admit: 2019-04-19 | Discharge: 2019-04-20

## 2019-04-29 MED FILL — NOVOLOG MIX 70-30 FLEXPEN U-100 INSULIN 100 UNIT/ML SUBCUTANEOUS PEN: 75 days supply | Qty: 60 | Fill #2

## 2019-04-29 MED FILL — NOVOLOG MIX 70-30 FLEXPEN U-100 INSULIN 100 UNIT/ML SUBCUTANEOUS PEN: 75 days supply | Qty: 60 | Fill #2 | Status: AC

## 2019-04-29 MED FILL — BD ULTRA-FINE NANO PEN NEEDLE 32 GAUGE X 5/32" (4 MM): 50 days supply | Qty: 100 | Fill #2

## 2019-04-29 MED FILL — BD ULTRA-FINE NANO PEN NEEDLE 32 GAUGE X 5/32" (4 MM): 50 days supply | Qty: 100 | Fill #2 | Status: AC

## 2019-04-29 MED FILL — INVOKANA 300 MG TABLET: ORAL | 90 days supply | Qty: 90 | Fill #3

## 2019-04-29 MED FILL — METFORMIN 1,000 MG TABLET: ORAL | 90 days supply | Qty: 180 | Fill #2

## 2019-04-29 MED FILL — INVOKANA 300 MG TABLET: 90 days supply | Qty: 90 | Fill #3 | Status: AC

## 2019-04-29 MED FILL — METFORMIN 1,000 MG TABLET: 90 days supply | Qty: 180 | Fill #2 | Status: AC

## 2019-05-20 MED FILL — ATORVASTATIN 20 MG TABLET: 90 days supply | Qty: 90 | Fill #3 | Status: AC

## 2019-05-20 MED FILL — ATORVASTATIN 20 MG TABLET: ORAL | 90 days supply | Qty: 90 | Fill #3

## 2019-05-27 MED FILL — OMEPRAZOLE 20 MG CAPSULE,DELAYED RELEASE: ORAL | 90 days supply | Qty: 90 | Fill #3

## 2019-05-27 MED FILL — OMEPRAZOLE 20 MG CAPSULE,DELAYED RELEASE: 90 days supply | Qty: 90 | Fill #3 | Status: AC

## 2019-06-07 ENCOUNTER — Ambulatory Visit: Admit: 2019-06-07 | Discharge: 2019-06-08

## 2019-06-07 DIAGNOSIS — H4311 Vitreous hemorrhage, right eye: Principal | ICD-10-CM

## 2019-06-24 ENCOUNTER — Ambulatory Visit: Admit: 2019-06-24 | Discharge: 2019-06-25 | Attending: Nurse Practitioner | Primary: Nurse Practitioner

## 2019-06-25 ENCOUNTER — Institutional Professional Consult (permissible substitution): Admit: 2019-06-25 | Discharge: 2019-06-26

## 2019-06-25 DIAGNOSIS — Z7189 Other specified counseling: Principal | ICD-10-CM

## 2019-06-25 DIAGNOSIS — H9312 Tinnitus, left ear: Principal | ICD-10-CM

## 2019-06-26 ENCOUNTER — Ambulatory Visit: Admit: 2019-06-26 | Discharge: 2019-06-26

## 2019-06-26 ENCOUNTER — Encounter: Admit: 2019-06-26 | Discharge: 2019-06-26

## 2019-06-26 MED ORDER — OFLOXACIN 0.3 % EYE DROPS
Freq: Four times a day (QID) | OPHTHALMIC | 0 refills | 25.00000 days | Status: CP
Start: 2019-06-26 — End: ?
  Filled 2019-06-26: qty 5, 18d supply, fill #0

## 2019-06-26 MED ORDER — PREDNISOLONE ACETATE 1 % EYE DROPS,SUSPENSION
0 refills | 0 days | Status: CP
Start: 2019-06-26 — End: ?
  Filled 2019-06-26: qty 5, 18d supply, fill #0

## 2019-06-26 MED ORDER — NEOMYCIN 3.5 MG/G-POLYMYXIN B 10,000 UNIT/G-DEXAMETH 0.1 % EYE OINT
0 refills | 0 days | Status: CP
Start: 2019-06-26 — End: ?
  Filled 2019-06-26: qty 3.5, 7d supply, fill #0

## 2019-06-26 MED FILL — OFLOXACIN 0.3 % EYE DROPS: 18 days supply | Qty: 5 | Fill #0 | Status: AC

## 2019-06-26 MED FILL — PREDNISOLONE ACETATE 1 % EYE DROPS,SUSPENSION: 18 days supply | Qty: 5 | Fill #0 | Status: AC

## 2019-06-26 MED FILL — NEOMYCIN 3.5 MG/G-POLYMYXIN B 10,000 UNIT/G-DEXAMETH 0.1 % EYE OINT: 7 days supply | Qty: 4 | Fill #0 | Status: AC

## 2019-06-27 ENCOUNTER — Ambulatory Visit: Admit: 2019-06-27 | Discharge: 2019-06-28

## 2019-06-27 DIAGNOSIS — H4311 Vitreous hemorrhage, right eye: Principal | ICD-10-CM

## 2019-07-01 ENCOUNTER — Other Ambulatory Visit: Payer: Self-pay

## 2019-07-01 ENCOUNTER — Ambulatory Visit: Payer: Self-pay | Attending: Internal Medicine

## 2019-07-01 DIAGNOSIS — Z23 Encounter for immunization: Secondary | ICD-10-CM

## 2019-07-01 NOTE — Progress Notes (Signed)
   Covid-19 Vaccination Clinic  Name:  Dejanay Wamboldt    MRN: [id number] DOB: 13-May-1960  07/01/2019  Ms. Osborne was observed post Covid-19 immunization for 15 minutes without incident. She was provided with Vaccine Information Sheet and instruction to access the V-Safe system.   Ms. Serafin was instructed to call 911 with any severe reactions post vaccine: Marland Kitchen Difficulty breathing  . Swelling of face and throat  . A fast heartbeat  . A bad rash all over body  . Dizziness and weakness   Immunizations Administered    Name Date Dose VIS Date Route   Pfizer COVID-19 Vaccine 07/01/2019  5:21 PM 0.3 mL 03/08/2019 Intramuscular   Manufacturer: [employer], [employer]   Lot: [id number]   NDC: [address]-1

## 2019-07-05 ENCOUNTER — Ambulatory Visit: Admit: 2019-07-05 | Discharge: 2019-07-06 | Attending: Ophthalmology | Primary: Ophthalmology

## 2019-07-05 DIAGNOSIS — H4311 Vitreous hemorrhage, right eye: Principal | ICD-10-CM

## 2019-07-22 ENCOUNTER — Ambulatory Visit: Payer: Self-pay | Attending: Internal Medicine

## 2019-07-22 DIAGNOSIS — Z23 Encounter for immunization: Secondary | ICD-10-CM

## 2019-07-22 NOTE — Progress Notes (Signed)
   Covid-19 Vaccination Clinic  Name:  Amaria Mundorf    MRN: [id number] DOB: 02/03/1961  07/22/2019  Ms. Sampsel was observed post Covid-19 immunization for 15 minutes without incident. She was provided with Vaccine Information Sheet and instruction to access the V-Safe system. Medical Interpreter used.  Ms. Mckissic was instructed to call 911 with any severe reactions post vaccine: Marland Kitchen Difficulty breathing  . Swelling of face and throat  . A fast heartbeat  . A bad rash all over body  . Dizziness and weakness   Immunizations Administered    Name Date Dose VIS Date Route   Pfizer COVID-19 Vaccine 07/22/2019  4:54 PM 0.3 mL 05/22/2018 Intramuscular   Manufacturer: [employer], [employer]   Lot: [id number]   NDC: [address]-1

## 2019-07-24 MED FILL — ULTICARE PEN NEEDLE 32 GAUGE X 5/32" (4 MM): 50 days supply | Qty: 100 | Fill #3

## 2019-07-24 MED FILL — INVOKANA 300 MG TABLET: ORAL | 60 days supply | Qty: 60 | Fill #4

## 2019-07-24 MED FILL — INVOKANA 300 MG TABLET: 60 days supply | Qty: 60 | Fill #4 | Status: AC

## 2019-07-24 MED FILL — ULTICARE PEN NEEDLE 32 GAUGE X 5/32" (4 MM): 50 days supply | Qty: 100 | Fill #3 | Status: AC

## 2019-08-09 ENCOUNTER — Ambulatory Visit: Admit: 2019-08-09 | Discharge: 2019-08-10

## 2019-08-09 DIAGNOSIS — H4311 Vitreous hemorrhage, right eye: Principal | ICD-10-CM

## 2019-08-16 ENCOUNTER — Institutional Professional Consult (permissible substitution): Admit: 2019-08-16 | Discharge: 2019-08-16 | Attending: Audiologist | Primary: Audiologist

## 2019-08-16 ENCOUNTER — Ambulatory Visit: Admit: 2019-08-16 | Discharge: 2019-08-16 | Attending: Otolaryngology | Primary: Otolaryngology

## 2019-08-16 DIAGNOSIS — H9312 Tinnitus, left ear: Principal | ICD-10-CM

## 2019-08-16 DIAGNOSIS — H9319 Tinnitus, unspecified ear: Principal | ICD-10-CM

## 2019-08-16 DIAGNOSIS — H9190 Unspecified hearing loss, unspecified ear: Principal | ICD-10-CM

## 2019-08-20 ENCOUNTER — Ambulatory Visit: Admit: 2019-08-20

## 2019-08-21 MED FILL — NOVOLOG MIX 70-30 FLEXPEN U-100 INSULIN 100 UNIT/ML SUBCUTANEOUS PEN: 75 days supply | Qty: 60 | Fill #3

## 2019-08-21 MED FILL — OMEPRAZOLE 20 MG CAPSULE,DELAYED RELEASE: ORAL | 60 days supply | Qty: 60 | Fill #4

## 2019-08-21 MED FILL — OMEPRAZOLE 20 MG CAPSULE,DELAYED RELEASE: 60 days supply | Qty: 60 | Fill #4 | Status: AC

## 2019-08-21 MED FILL — METFORMIN 1,000 MG TABLET: ORAL | 90 days supply | Qty: 180 | Fill #3

## 2019-08-21 MED FILL — ATORVASTATIN 20 MG TABLET: 60 days supply | Qty: 60 | Fill #4 | Status: AC

## 2019-08-21 MED FILL — METFORMIN 1,000 MG TABLET: 90 days supply | Qty: 180 | Fill #3 | Status: AC

## 2019-08-21 MED FILL — ATORVASTATIN 20 MG TABLET: ORAL | 60 days supply | Qty: 60 | Fill #4

## 2019-08-21 MED FILL — NOVOLOG MIX 70-30 FLEXPEN U-100 INSULIN 100 UNIT/ML SUBCUTANEOUS PEN: 75 days supply | Qty: 60 | Fill #3 | Status: AC

## 2019-09-17 ENCOUNTER — Ambulatory Visit
Admit: 2019-09-17 | Discharge: 2019-09-18 | Attending: Student in an Organized Health Care Education/Training Program | Primary: Student in an Organized Health Care Education/Training Program

## 2019-09-17 MED ORDER — DORZOLAMIDE 22.3 MG-TIMOLOL 6.8 MG/ML EYE DROPS
Freq: Two times a day (BID) | OPHTHALMIC | 12 refills | 100 days | Status: CP
Start: 2019-09-17 — End: ?
  Filled 2019-09-18: qty 10, 50d supply, fill #0

## 2019-09-18 MED ORDER — CANAGLIFLOZIN 300 MG TABLET
ORAL_TABLET | Freq: Every morning | ORAL | 1 refills | 90 days | Status: CP
Start: 2019-09-18 — End: 2020-09-17
  Filled 2019-09-20: qty 90, 90d supply, fill #0

## 2019-09-18 MED FILL — DORZOLAMIDE 22.3 MG-TIMOLOL 6.8 MG/ML EYE DROPS: 50 days supply | Qty: 10 | Fill #0 | Status: AC

## 2019-09-20 MED FILL — INVOKANA 300 MG TABLET: 90 days supply | Qty: 90 | Fill #0 | Status: AC

## 2019-09-26 ENCOUNTER — Encounter: Admit: 2019-09-26 | Discharge: 2019-09-26 | Attending: Registered Nurse | Primary: Registered Nurse

## 2019-09-26 ENCOUNTER — Ambulatory Visit: Admit: 2019-09-26 | Discharge: 2019-09-26

## 2019-09-26 MED ORDER — OFLOXACIN 0.3 % EYE DROPS: 1 [drp] | mL | Freq: Four times a day (QID) | 0 refills | 25 days | Status: AC

## 2019-09-26 MED ORDER — PREDNISOLONE ACETATE 1 % EYE DROPS,SUSPENSION
Freq: Four times a day (QID) | OPHTHALMIC | 1 refills | 25.00000 days | Status: CP
Start: 2019-09-26 — End: 2019-10-26
  Filled 2019-09-26: qty 5, 25d supply, fill #0

## 2019-09-26 MED ORDER — KETOROLAC 0.5 % EYE DROPS: 1 [drp] | mL | Freq: Four times a day (QID) | 0 refills | 25 days | Status: AC

## 2019-09-26 MED ORDER — KETOROLAC 0.5 % EYE DROPS
Freq: Four times a day (QID) | OPHTHALMIC | 0 refills | 25.00000 days | Status: CP
Start: 2019-09-26 — End: 2019-09-26
  Filled 2019-09-26: qty 5, 25d supply, fill #0

## 2019-09-26 MED ORDER — PREDNISOLONE ACETATE 1 % EYE DROPS,SUSPENSION: 1 [drp] | mL | Freq: Four times a day (QID) | 1 refills | 25 days | Status: AC

## 2019-09-26 MED ORDER — OFLOXACIN 0.3 % EYE DROPS
Freq: Four times a day (QID) | OPHTHALMIC | 0 refills | 25.00000 days | Status: CP
Start: 2019-09-26 — End: 2019-09-26
  Filled 2019-09-26: qty 5, 25d supply, fill #0

## 2019-09-26 MED FILL — KETOROLAC 0.5 % EYE DROPS: 25 days supply | Qty: 5 | Fill #0 | Status: AC

## 2019-09-26 MED FILL — OFLOXACIN 0.3 % EYE DROPS: 25 days supply | Qty: 5 | Fill #0 | Status: AC

## 2019-09-26 MED FILL — PREDNISOLONE ACETATE 1 % EYE DROPS,SUSPENSION: 25 days supply | Qty: 5 | Fill #0 | Status: AC

## 2019-09-27 ENCOUNTER — Ambulatory Visit
Admit: 2019-09-27 | Discharge: 2019-09-28 | Attending: Student in an Organized Health Care Education/Training Program | Primary: Student in an Organized Health Care Education/Training Program

## 2019-09-27 DIAGNOSIS — Z9889 Other specified postprocedural states: Principal | ICD-10-CM

## 2019-10-10 ENCOUNTER — Ambulatory Visit: Admit: 2019-10-10 | Discharge: 2019-10-10

## 2019-10-10 ENCOUNTER — Encounter
Admit: 2019-10-10 | Discharge: 2019-10-10 | Attending: Student in an Organized Health Care Education/Training Program | Primary: Student in an Organized Health Care Education/Training Program

## 2019-10-10 MED ORDER — PREDNISOLONE ACETATE 1 % EYE DROPS,SUSPENSION
Freq: Four times a day (QID) | OPHTHALMIC | 0 refills | 25.00000 days | Status: CP
Start: 2019-10-10 — End: ?
  Filled 2019-10-10: qty 5, 25d supply, fill #0

## 2019-10-10 MED ORDER — KETOROLAC 0.5 % EYE DROPS
Freq: Four times a day (QID) | OPHTHALMIC | 1 refills | 25 days | Status: CP
Start: 2019-10-10 — End: 2019-11-09
  Filled 2019-10-10: qty 5, 25d supply, fill #0

## 2019-10-10 MED ORDER — OFLOXACIN 0.3 % EYE DROPS
Freq: Four times a day (QID) | OPHTHALMIC | 0 refills | 25.00000 days | Status: CP
Start: 2019-10-10 — End: 2019-11-04
  Filled 2019-10-10: qty 5, 25d supply, fill #0

## 2019-10-10 MED FILL — OFLOXACIN 0.3 % EYE DROPS: 25 days supply | Qty: 5 | Fill #0 | Status: AC

## 2019-10-10 MED FILL — PREDNISOLONE ACETATE 1 % EYE DROPS,SUSPENSION: 25 days supply | Qty: 5 | Fill #0 | Status: AC

## 2019-10-10 MED FILL — KETOROLAC 0.5 % EYE DROPS: 25 days supply | Qty: 5 | Fill #0 | Status: AC

## 2019-10-11 ENCOUNTER — Ambulatory Visit
Admit: 2019-10-11 | Discharge: 2019-10-12 | Attending: Student in an Organized Health Care Education/Training Program | Primary: Student in an Organized Health Care Education/Training Program

## 2019-10-11 DIAGNOSIS — Z9889 Other specified postprocedural states: Principal | ICD-10-CM

## 2019-10-17 MED ORDER — PEN NEEDLE, DIABETIC 32 GAUGE X 5/32" (4 MM)
12 refills | 0 days | Status: CP
Start: 2019-10-17 — End: ?

## 2019-10-17 MED ORDER — METFORMIN 1,000 MG TABLET
ORAL_TABLET | Freq: Two times a day (BID) | ORAL | 3 refills | 90 days | Status: CP
Start: 2019-10-17 — End: 2020-10-16
  Filled 2019-12-26: qty 180, 90d supply, fill #0

## 2019-10-17 MED ORDER — ATORVASTATIN 20 MG TABLET
ORAL_TABLET | Freq: Every day | ORAL | 3 refills | 90.00000 days | Status: CP
Start: 2019-10-17 — End: 2020-10-16
  Filled 2019-10-21: qty 90, 90d supply, fill #0

## 2019-10-17 MED ORDER — OMEPRAZOLE 20 MG CAPSULE,DELAYED RELEASE
ORAL_CAPSULE | Freq: Every day | ORAL | 3 refills | 90 days | Status: CP
Start: 2019-10-17 — End: 2020-10-16
  Filled 2019-10-21: qty 90, 90d supply, fill #0

## 2019-10-21 MED FILL — OMEPRAZOLE 20 MG CAPSULE,DELAYED RELEASE: 90 days supply | Qty: 90 | Fill #0 | Status: AC

## 2019-10-21 MED FILL — ULTICARE PEN NEEDLE 32 GAUGE X 5/32" (4 MM): 100 days supply | Qty: 200 | Fill #0

## 2019-10-21 MED FILL — ATORVASTATIN 20 MG TABLET: 90 days supply | Qty: 90 | Fill #0 | Status: AC

## 2019-10-21 MED FILL — ULTICARE PEN NEEDLE 32 GAUGE X 5/32" (4 MM): 100 days supply | Qty: 200 | Fill #0 | Status: AC

## 2019-10-25 ENCOUNTER — Ambulatory Visit
Admit: 2019-10-25 | Discharge: 2019-10-26 | Attending: Student in an Organized Health Care Education/Training Program | Primary: Student in an Organized Health Care Education/Training Program

## 2019-10-25 DIAGNOSIS — Z9889 Other specified postprocedural states: Principal | ICD-10-CM

## 2019-10-30 ENCOUNTER — Ambulatory Visit: Admit: 2019-10-30 | Discharge: 2019-10-30

## 2019-10-30 ENCOUNTER — Ambulatory Visit: Admit: 2019-10-30 | Discharge: 2019-10-30 | Attending: Otolaryngology | Primary: Otolaryngology

## 2019-10-30 DIAGNOSIS — H9312 Tinnitus, left ear: Principal | ICD-10-CM

## 2019-10-30 DIAGNOSIS — H903 Sensorineural hearing loss, bilateral: Principal | ICD-10-CM

## 2019-11-05 ENCOUNTER — Ambulatory Visit: Admit: 2019-11-05 | Discharge: 2019-11-06

## 2019-11-05 DIAGNOSIS — Z01419 Encounter for gynecological examination (general) (routine) without abnormal findings: Principal | ICD-10-CM

## 2019-11-05 DIAGNOSIS — L732 Hidradenitis suppurativa: Principal | ICD-10-CM

## 2019-11-05 DIAGNOSIS — E118 Type 2 diabetes mellitus with unspecified complications: Principal | ICD-10-CM

## 2019-11-05 MED ORDER — DOXYCYCLINE HYCLATE 100 MG CAPSULE
ORAL_CAPSULE | Freq: Two times a day (BID) | ORAL | 1 refills | 30 days | Status: CP
Start: 2019-11-05 — End: ?
  Filled 2019-11-11: qty 60, 30d supply, fill #0

## 2019-11-08 ENCOUNTER — Ambulatory Visit
Admit: 2019-11-08 | Discharge: 2019-11-09 | Attending: Student in an Organized Health Care Education/Training Program | Primary: Student in an Organized Health Care Education/Training Program

## 2019-11-08 DIAGNOSIS — H26493 Other secondary cataract, bilateral: Principal | ICD-10-CM

## 2019-11-11 MED FILL — DOXYCYCLINE HYCLATE 100 MG CAPSULE: 30 days supply | Qty: 60 | Fill #0 | Status: AC

## 2019-11-14 ENCOUNTER — Ambulatory Visit: Admit: 2019-11-14 | Discharge: 2019-11-15

## 2019-11-14 DIAGNOSIS — H4311 Vitreous hemorrhage, right eye: Principal | ICD-10-CM

## 2019-11-25 ENCOUNTER — Ambulatory Visit
Admit: 2019-11-25 | Discharge: 2019-11-26 | Attending: Student in an Organized Health Care Education/Training Program | Primary: Student in an Organized Health Care Education/Training Program

## 2019-11-25 DIAGNOSIS — Z9889 Other specified postprocedural states: Principal | ICD-10-CM

## 2019-11-25 MED ORDER — PREDNISOLONE ACETATE 1 % EYE DROPS,SUSPENSION
Freq: Four times a day (QID) | OPHTHALMIC | 0 refills | 25 days | Status: CP
Start: 2019-11-25 — End: ?
  Filled 2019-11-25: qty 5, 25d supply, fill #0

## 2019-11-25 MED ORDER — DORZOLAMIDE 22.3 MG-TIMOLOL 6.8 MG/ML EYE DROPS
Freq: Two times a day (BID) | OPHTHALMIC | 12 refills | 100.00000 days | Status: CP
Start: 2019-11-25 — End: ?
  Filled 2019-11-25: qty 10, 100d supply, fill #0

## 2019-11-25 MED FILL — DORZOLAMIDE 22.3 MG-TIMOLOL 6.8 MG/ML EYE DROPS: 100 days supply | Qty: 10 | Fill #0 | Status: AC

## 2019-11-25 MED FILL — PREDNISOLONE ACETATE 1 % EYE DROPS,SUSPENSION: 25 days supply | Qty: 5 | Fill #0 | Status: AC

## 2019-12-11 ENCOUNTER — Institutional Professional Consult (permissible substitution): Admit: 2019-12-11 | Discharge: 2019-12-12

## 2019-12-11 DIAGNOSIS — F418 Other specified anxiety disorders: Principal | ICD-10-CM

## 2019-12-13 ENCOUNTER — Ambulatory Visit
Admit: 2019-12-13 | Discharge: 2019-12-14 | Attending: Student in an Organized Health Care Education/Training Program | Primary: Student in an Organized Health Care Education/Training Program

## 2019-12-13 DIAGNOSIS — H4051X3 Glaucoma secondary to other eye disorders, right eye, severe stage: Principal | ICD-10-CM

## 2019-12-13 DIAGNOSIS — H40003 Preglaucoma, unspecified, bilateral: Principal | ICD-10-CM

## 2019-12-20 ENCOUNTER — Institutional Professional Consult (permissible substitution): Admit: 2019-12-20 | Discharge: 2019-12-21 | Attending: Audiologist | Primary: Audiologist

## 2019-12-26 MED FILL — INVOKANA 300 MG TABLET: ORAL | 30 days supply | Qty: 30 | Fill #1

## 2019-12-26 MED FILL — KETOROLAC 0.5 % EYE DROPS: 25 days supply | Qty: 5 | Fill #0 | Status: AC

## 2019-12-26 MED FILL — KETOROLAC 0.5 % EYE DROPS: OPHTHALMIC | 25 days supply | Qty: 5 | Fill #0

## 2019-12-26 MED FILL — DOXYCYCLINE HYCLATE 100 MG CAPSULE: ORAL | 30 days supply | Qty: 60 | Fill #1

## 2019-12-26 MED FILL — PREDNISOLONE ACETATE 1 % EYE DROPS,SUSPENSION: 25 days supply | Qty: 5 | Fill #0 | Status: AC

## 2019-12-26 MED FILL — PREDNISOLONE ACETATE 1 % EYE DROPS,SUSPENSION: OPHTHALMIC | 25 days supply | Qty: 5 | Fill #0

## 2019-12-26 MED FILL — DOXYCYCLINE HYCLATE 100 MG CAPSULE: 30 days supply | Qty: 60 | Fill #1 | Status: AC

## 2019-12-26 MED FILL — INVOKANA 300 MG TABLET: 30 days supply | Qty: 30 | Fill #1 | Status: AC

## 2019-12-26 MED FILL — METFORMIN 1,000 MG TABLET: 90 days supply | Qty: 180 | Fill #0 | Status: AC

## 2019-12-30 ENCOUNTER — Ambulatory Visit: Admit: 2019-12-30 | Discharge: 2019-12-30 | Attending: Otolaryngology | Primary: Otolaryngology

## 2019-12-30 ENCOUNTER — Ambulatory Visit: Admit: 2019-12-30 | Discharge: 2019-12-30

## 2019-12-30 DIAGNOSIS — H905 Unspecified sensorineural hearing loss: Principal | ICD-10-CM

## 2019-12-30 DIAGNOSIS — H903 Sensorineural hearing loss, bilateral: Principal | ICD-10-CM

## 2020-01-02 ENCOUNTER — Ambulatory Visit: Admit: 2020-01-02 | Discharge: 2020-01-03

## 2020-01-02 DIAGNOSIS — L732 Hidradenitis suppurativa: Principal | ICD-10-CM

## 2020-01-02 MED ORDER — SPIRONOLACTONE 50 MG TABLET
ORAL_TABLET | 5 refills | 0.00000 days | Status: CP
Start: 2020-01-02 — End: ?
  Filled 2020-01-03: qty 120, 37d supply, fill #0

## 2020-01-02 MED ORDER — CLINDAMYCIN PHOSPHATE 1 % TOPICAL SWAB
11 refills | 0.00000 days | Status: CP
Start: 2020-01-02 — End: ?

## 2020-01-03 DIAGNOSIS — H905 Unspecified sensorineural hearing loss: Principal | ICD-10-CM

## 2020-01-03 MED ORDER — CLINDAMYCIN 1 % LOTION
Freq: Every morning | TOPICAL | 5 refills | 0.00000 days | Status: CP
Start: 2020-01-03 — End: ?

## 2020-01-03 MED FILL — SPIRONOLACTONE 25 MG TABLET: 37 days supply | Qty: 120 | Fill #0 | Status: AC

## 2020-01-06 ENCOUNTER — Ambulatory Visit: Admit: 2020-01-06 | Discharge: 2020-01-06 | Attending: Otolaryngology | Primary: Otolaryngology

## 2020-01-06 ENCOUNTER — Institutional Professional Consult (permissible substitution): Admit: 2020-01-06 | Discharge: 2020-01-06 | Attending: Audiologist | Primary: Audiologist

## 2020-01-06 DIAGNOSIS — H903 Sensorineural hearing loss, bilateral: Principal | ICD-10-CM

## 2020-01-06 MED ORDER — DEXAMETHASONE SODIUM PHOSPHATE 0.1 % EYE DROPS
Freq: Two times a day (BID) | OTIC | 0 refills | 13.00000 days | Status: CP
Start: 2020-01-06 — End: ?
  Filled 2020-01-07: qty 5, 13d supply, fill #0

## 2020-01-07 MED FILL — DEXAMETHASONE SODIUM PHOSPHATE 0.1 % EYE DROPS: 13 days supply | Qty: 5 | Fill #0 | Status: AC

## 2020-01-10 ENCOUNTER — Ambulatory Visit
Admit: 2020-01-10 | Discharge: 2020-01-11 | Attending: Student in an Organized Health Care Education/Training Program | Primary: Student in an Organized Health Care Education/Training Program

## 2020-01-10 DIAGNOSIS — H4051X3 Glaucoma secondary to other eye disorders, right eye, severe stage: Principal | ICD-10-CM

## 2020-01-20 ENCOUNTER — Ambulatory Visit: Admit: 2020-01-20 | Discharge: 2020-01-21

## 2020-01-20 DIAGNOSIS — Z794 Long term (current) use of insulin: Principal | ICD-10-CM

## 2020-01-20 DIAGNOSIS — H9190 Unspecified hearing loss, unspecified ear: Principal | ICD-10-CM

## 2020-01-20 DIAGNOSIS — E11319 Type 2 diabetes mellitus with unspecified diabetic retinopathy without macular edema: Principal | ICD-10-CM

## 2020-01-20 DIAGNOSIS — F418 Other specified anxiety disorders: Principal | ICD-10-CM

## 2020-01-20 DIAGNOSIS — H269 Unspecified cataract: Principal | ICD-10-CM

## 2020-01-20 DIAGNOSIS — H547 Unspecified visual loss: Principal | ICD-10-CM

## 2020-01-20 DIAGNOSIS — L732 Hidradenitis suppurativa: Principal | ICD-10-CM

## 2020-01-23 ENCOUNTER — Ambulatory Visit: Admit: 2020-01-23 | Discharge: 2020-01-24

## 2020-01-23 DIAGNOSIS — Z789 Other specified health status: Principal | ICD-10-CM

## 2020-01-27 MED FILL — OMEPRAZOLE 20 MG CAPSULE,DELAYED RELEASE: 90 days supply | Qty: 90 | Fill #1 | Status: AC

## 2020-01-27 MED FILL — ATORVASTATIN 20 MG TABLET: 90 days supply | Qty: 90 | Fill #1 | Status: AC

## 2020-01-27 MED FILL — OMEPRAZOLE 20 MG CAPSULE,DELAYED RELEASE: ORAL | 90 days supply | Qty: 90 | Fill #1

## 2020-01-27 MED FILL — INVOKANA 300 MG TABLET: 30 days supply | Qty: 30 | Fill #2 | Status: AC

## 2020-01-27 MED FILL — ATORVASTATIN 20 MG TABLET: ORAL | 90 days supply | Qty: 90 | Fill #1

## 2020-01-27 MED FILL — INVOKANA 300 MG TABLET: ORAL | 30 days supply | Qty: 30 | Fill #2

## 2020-02-04 ENCOUNTER — Other Ambulatory Visit: Admit: 2020-02-04 | Discharge: 2020-02-05

## 2020-02-04 DIAGNOSIS — Z794 Long term (current) use of insulin: Principal | ICD-10-CM

## 2020-02-04 DIAGNOSIS — E11319 Type 2 diabetes mellitus with unspecified diabetic retinopathy without macular edema: Principal | ICD-10-CM

## 2020-02-10 ENCOUNTER — Ambulatory Visit: Admit: 2020-02-10 | Discharge: 2020-02-10 | Attending: Otolaryngology | Primary: Otolaryngology

## 2020-02-10 ENCOUNTER — Institutional Professional Consult (permissible substitution): Admit: 2020-02-10 | Discharge: 2020-02-10

## 2020-02-10 DIAGNOSIS — H903 Sensorineural hearing loss, bilateral: Principal | ICD-10-CM

## 2020-02-25 MED FILL — INVOKANA 300 MG TABLET: 30 days supply | Qty: 30 | Fill #3 | Status: AC

## 2020-02-25 MED FILL — ULTICARE PEN NEEDLE 32 GAUGE X 5/32" (4 MM): 100 days supply | Qty: 200 | Fill #1 | Status: AC

## 2020-02-25 MED FILL — INVOKANA 300 MG TABLET: ORAL | 30 days supply | Qty: 30 | Fill #3

## 2020-02-25 MED FILL — ULTICARE PEN NEEDLE 32 GAUGE X 5/32" (4 MM): 100 days supply | Qty: 200 | Fill #1

## 2020-03-02 ENCOUNTER — Ambulatory Visit: Admit: 2020-03-02 | Discharge: 2020-03-03 | Attending: Audiologist | Primary: Audiologist

## 2020-03-02 DIAGNOSIS — H903 Sensorineural hearing loss, bilateral: Principal | ICD-10-CM

## 2020-03-24 MED ORDER — CANAGLIFLOZIN 300 MG TABLET
ORAL_TABLET | Freq: Every morning | ORAL | 2 refills | 90 days | Status: CP
Start: 2020-03-24 — End: 2021-03-24
  Filled 2020-03-26: qty 30, 30d supply, fill #0

## 2020-03-26 MED FILL — SPIRONOLACTONE 25 MG TABLET: 30 days supply | Qty: 120 | Fill #1 | Status: AC

## 2020-03-26 MED FILL — SPIRONOLACTONE 25 MG TABLET: ORAL | 30 days supply | Qty: 120 | Fill #1

## 2020-03-26 MED FILL — INVOKANA 300 MG TABLET: 30 days supply | Qty: 30 | Fill #0 | Status: AC

## 2020-04-10 DIAGNOSIS — H4051X3 Glaucoma secondary to other eye disorders, right eye, severe stage: Principal | ICD-10-CM

## 2020-04-10 DIAGNOSIS — H4311 Vitreous hemorrhage, right eye: Principal | ICD-10-CM

## 2020-04-22 MED FILL — ATORVASTATIN 20 MG TABLET: ORAL | 90 days supply | Qty: 90 | Fill #2

## 2020-04-22 MED FILL — OMEPRAZOLE 20 MG CAPSULE,DELAYED RELEASE: ORAL | 90 days supply | Qty: 90 | Fill #2

## 2020-05-01 ENCOUNTER — Ambulatory Visit
Admit: 2020-05-01 | Discharge: 2020-05-02 | Payer: PRIVATE HEALTH INSURANCE | Attending: Student in an Organized Health Care Education/Training Program | Primary: Student in an Organized Health Care Education/Training Program

## 2020-05-01 DIAGNOSIS — H4051X3 Glaucoma secondary to other eye disorders, right eye, severe stage: Principal | ICD-10-CM

## 2020-05-01 MED ORDER — PREDNISOLONE ACETATE 1 % EYE DROPS,SUSPENSION
Freq: Four times a day (QID) | OPHTHALMIC | 1 refills | 25 days | Status: CP
Start: 2020-05-01 — End: 2020-05-31
  Filled 2020-05-04: qty 5, 25d supply, fill #0

## 2020-05-07 MED FILL — INVOKANA 300 MG TABLET: ORAL | 30 days supply | Qty: 30 | Fill #1

## 2020-05-15 ENCOUNTER — Other Ambulatory Visit: Admit: 2020-05-15 | Discharge: 2020-05-15 | Payer: PRIVATE HEALTH INSURANCE

## 2020-05-15 ENCOUNTER — Ambulatory Visit: Admit: 2020-05-15 | Discharge: 2020-05-15 | Payer: PRIVATE HEALTH INSURANCE

## 2020-05-15 DIAGNOSIS — L732 Hidradenitis suppurativa: Principal | ICD-10-CM

## 2020-05-20 MED ORDER — HUMIRA PEN CITRATE FREE 40 MG/0.4 ML
SUBCUTANEOUS | 11 refills | 28 days | Status: CP
Start: 2020-05-20 — End: 2021-05-20
  Filled 2020-05-27: qty 3, 28d supply, fill #0

## 2020-05-20 MED ORDER — HUMIRA PEN CITRATE FREE STARTER PACK FOR CROHN'S/UC/HS 3 X 80 MG/0.8 ML
PACK | 0 refills | 0 days | Status: CP
Start: 2020-05-20 — End: ?

## 2020-05-21 DIAGNOSIS — L732 Hidradenitis suppurativa: Principal | ICD-10-CM

## 2020-05-26 ENCOUNTER — Ambulatory Visit
Admit: 2020-05-26 | Discharge: 2020-05-27 | Payer: PRIVATE HEALTH INSURANCE | Attending: Audiologist | Primary: Audiologist

## 2020-05-26 MED ORDER — EMPTY CONTAINER
2 refills | 0 days
Start: 2020-05-26 — End: ?
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
## 2020-05-26 NOTE — Unmapped (Signed)
[facility] Specialty Medication Onboarding    Specialty Medication: Humira starter kit and maintenance  Prior Authorization: Approved   Financial Assistance: No - copay  <$25  Final Copay/Day Supply: $3 / 28 days (for starter and maintenance)    Insurance Restrictions: None     Notes to Pharmacist:     The triage team has completed the benefits investigation and has determined that the patient is able to fill this medication at [facility]. Please contact the patient to complete the onboarding or follow up with the prescribing physician as needed.

## 2020-05-27 MED FILL — EMPTY CONTAINER: 120 days supply | Qty: 1 | Fill #0

## 2020-05-27 NOTE — Unmapped (Signed)
ADULT HEARING AID FITTING                                              Name:  Anne Barber    Date: May 26, 2020    DOB:    1961-01-31   MRN: [id number]   Age:     60 y.o.        [name], a patient with a known Sensorineural hearing loss received a left RIC hearing aid(s) today with the [facility] program.  Today's appointment is facilitated by a certified Spanish interpreter.     Left Ear   Manufacturer [employer]   Model Pure C&G T 3AX    Serial Number [id number]   HAF DATE 05/26/2020   Warranty 04/09/2023   Receiver strength and size 61M   Ear mold [id number]   Bluetooth Available   Battery Rechargeable   Charger Serial Number [id number] [phone]   Loss and Damage Claim  available     Physical fit: Hearing aid(s) fit snugly with size 1 receiver/tube. Patient was able to insert battery and aid(s) independently.    Programming:  Experienced user prescription selected. Critical gain measured to ensure reduction of possible feedback. Real-ear measures were performed using NAL-NL2  targets to verify audibility of speech at soft, average and loud levels. Toggle function is program change and volume control. Hearing aids were programmed today with the following program(s):  P1: Universal   P2: Telecoil    Counseling:  [name] was counseled regarding use, care, and maintenance of equipment. Patient was also counseled on the manufacturer's warranty as well as how to request loss and damage replacement of broken or lost hearing aid(s). [name] was counseled regarding realistic expectations. Charger is working properly with patient???s hearing aids and rechargeable batteries.     Recommendations:  ??? Work up to full time use of hearing aids within the next 2 weeks  ??? Return to clinic in 2 weeks for follow-up programming    [name], M.Ed  Social research officer, government    I was physically present and immediately available to direct and supervise tasks that were related to patient management. The direction and supervision was continuous throughout the time these tasks were performed.    We wore appropriate PPE throughout entire appointment (face mask and eye protection). Patient also wore face mask appropriately for the entire appointment.    Procedure(s):  ??? CPT V5257 - Hearing Aid Digital Monaural BTE; Bill to Other Agency  ??? CPT V5264 - [place]old (per ear); Quantity: 1; Bill to Other Agency  ??? CPT V5241 - Dispensing Fee (per device); Bill to [employer]   ??? No Charge - Quantity: 2    Visit Time: 60 min     Edith Groleau R Mada Sadik, AuD  Clinical Audiologist

## 2020-05-29 ENCOUNTER — Ambulatory Visit
Admit: 2020-05-29 | Discharge: 2020-05-30 | Payer: PRIVATE HEALTH INSURANCE | Attending: Student in an Organized Health Care Education/Training Program | Primary: Student in an Organized Health Care Education/Training Program

## 2020-05-29 DIAGNOSIS — H4051X3 Glaucoma secondary to other eye disorders, right eye, severe stage: Principal | ICD-10-CM

## 2020-05-29 MED ORDER — PREDNISOLONE ACETATE 1 % EYE DROPS,SUSPENSION
Freq: Four times a day (QID) | OPHTHALMIC | 2 refills | 25.00000 days | Status: CP
Start: 2020-05-29 — End: 2020-06-28
  Filled 2020-06-15: qty 5, 25d supply, fill #0
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
## 2020-06-02 DIAGNOSIS — E11319 Type 2 diabetes mellitus with unspecified diabetic retinopathy without macular edema: Principal | ICD-10-CM

## 2020-06-08 MED ORDER — CANAGLIFLOZIN 300 MG TABLET
ORAL_TABLET | Freq: Every morning | ORAL | 2 refills | 90 days
Start: 2020-06-08 — End: 2021-06-08

## 2020-06-08 NOTE — Unmapped (Signed)
Patients Invokana is no longer covered by insurance. Patient is currently out of medicine. (just changed insurance last month) Alternatives are [place] or [place].  Please advise

## 2020-06-11 ENCOUNTER — Ambulatory Visit: Admit: 2020-06-11 | Discharge: 2020-06-12 | Payer: PRIVATE HEALTH INSURANCE

## 2020-06-11 DIAGNOSIS — Z794 Long term (current) use of insulin: Principal | ICD-10-CM

## 2020-06-11 DIAGNOSIS — E113513 Type 2 diabetes mellitus with proliferative diabetic retinopathy with macular edema, bilateral: Principal | ICD-10-CM

## 2020-06-11 DIAGNOSIS — Z1231 Encounter for screening mammogram for malignant neoplasm of breast: Principal | ICD-10-CM

## 2020-06-11 DIAGNOSIS — F418 Other specified anxiety disorders: Principal | ICD-10-CM

## 2020-06-11 DIAGNOSIS — E11319 Type 2 diabetes mellitus with unspecified diabetic retinopathy without macular edema: Principal | ICD-10-CM

## 2020-06-11 MED ORDER — EMPAGLIFLOZIN 25 MG TABLET
ORAL_TABLET | Freq: Every day | ORAL | 3 refills | 90 days | Status: CP
Start: 2020-06-11 — End: ?
  Filled 2020-06-15: qty 90, 90d supply, fill #0

## 2020-06-11 MED ORDER — LOSARTAN 25 MG TABLET
ORAL_TABLET | Freq: Every day | ORAL | 3 refills | 90 days | Status: CP
Start: 2020-06-11 — End: ?
  Filled 2020-06-15: qty 90, 90d supply, fill #0

## 2020-06-11 NOTE — Unmapped (Signed)
Assessment and Plan:     Anne Barber was seen today for follow-up.    Diagnoses and all orders for this visit:    Type 2 diabetes mellitus with retinopathy, with long-term current use of insulin, macular edema presence unspecified, unspecified laterality, unspecified retinopathy severity (CMS-HCC)  -     losartan (COZAAR) 25 MG tablet; Take 1 tablet (25 mg total) by mouth daily.  -     empagliflozin (JARDIANCE) 25 mg tablet; Take 1 tablet (25 mg total) by mouth daily.  -     POCT glycosylated hemoglobin (Hb A1C): 8.3  -    Discussed diet changes, activity as able.    Screening mammogram for breast cancer  -     Mammography screening bilateral; Future    Vision changes: Although patient did not mention blackout vision episodes during visit, given co morbidities-will refer for carotid PVLs      - ED precautions    HS: Start Humira injections   - Continue clindamycin (CLEOCIN T) 1 % Swab; Wipe affected areas where lesions develop after showering twice daily. Even when you are not flaring.  - Stop spironolactone (ALDACTONE) 25 MG tablet  - Recommended that she continue her anti-bacterial wash (suspect Hibiclens) in the groin area several times per week    Prevention: Referred for mammogram, discussed Zoster vaccine  Pap/Pelvic Exam- 10/31/2016. Negative pap (partially obscuring inflammation), negative HPV. Next due in 2023.  Mammogram- Due in January 2022.  Colonoscopy- Due in 2024.  DEXA- Age 56  TdaP Vaccine- 06/25/2014, next due in 2026.  Pneumovax- Received 06/25/2014. Next due at age 42.  Prevnar- Age 33  Shingles vaccine- Age 15    Barriers to recommended plan: transportation, financial, health literacy    Return in about 3 months (around 09/11/2020) for DM, HTN.      Subjective:     HPI: Anne Barber is a 60 y.o. female here for Follow-up (Sugars running 137-152 no higher than that. Feel medication is working fine working on a [employer]).    DM: Metformin 1gram BID, atorvastatin 20mg ,, 40 U Novolog mix 70-30. Invokana-not covered by [facility] FA, Per pharmacy-Jardiance 25mg  or Farxija covered  Pt feeling well, no  Physical complaints today  152-highest blood sugar  120-130 (fasting)  140s (PP)  Lowest 78,  Noticed when did not eat  Diet: cut back on carbs, sugars.  Trying to be active, difficult due to hearing/vision      Vision: Vision decreased, using drops-steroid drops, not covered by [facility] financial assistance-has not started  Last visit 05/29/20, inflammation of anterior vitreous cell component  At visit, pt mentioned episode of bilateral blackout vision which lasted for a few seconds.  Patient did not mention/denied this symptom at today's visit.  Given comorbidities high risk for TIA  Last carotid PVL in 2015 was negative, cardiac mild perfusion scan in June, 2019 was negative    HS: Stop spirolactone 25mg  ( 4 tablets every day), start Humira, continue Hibiclens/clindamycin. followed by [facility]    Hearing loss: seen by [facility], visits 11/21 and 12/21 respectively    Mood: States combination of pandemic, hearing and vision problems, causes her mood to be low at times.  Overall feels coping okay with situation.  Does have appointment with psychologist tomorrow.  Planning on applying for disability    Unaccompanied  I have reviewed past medical, surgical, medications, allergies, social and family histories today and updated them in Epic where appropriate.  Lab Results   Component Value Date    A1C 8.3 (A) 06/11/2020     ROS:   Review of Systems   HENT: Positive for hearing loss.    Eyes:        Decreased vision-baseline for patient   Cardiovascular: Negative.    Gastrointestinal: Negative.    Neurological: Negative.         Review of systems negative unless otherwise noted as per HPI.      Objective:     Vitals:    06/11/20 0841   Temp:    SpO2: 98%     Body mass index is 35.63 kg/m??.    Physical Exam  Vitals and nursing note reviewed.   Constitutional:       Appearance: Normal appearance. She is normal weight.   Cardiovascular:      Rate and Rhythm: Normal rate and regular rhythm.      Pulses: Normal pulses.      Heart sounds: Normal heart sounds.   Pulmonary:      Effort: Pulmonary effort is normal.      Breath sounds: Normal breath sounds.   Musculoskeletal:         General: Normal range of motion.      Cervical back: Normal range of motion and neck supple.   Skin:     General: Skin is warm and dry.      Capillary Refill: Capillary refill takes less than 2 seconds.   Neurological:      General: No focal deficit present.      Mental Status: She is alert and oriented to person, place, and time. Mental status is at baseline.      Cranial Nerves: No cranial nerve deficit.      Motor: No weakness.      Gait: Gait normal.   Psychiatric:         Mood and Affect: Mood normal.         Thought Content: Thought content normal.         Judgment: Judgment normal.            Medication adherence and barriers to the treatment plan have been addressed. Opportunities to optimize healthy behaviors have been discussed. Patient / caregiver voiced understanding.   I personally spent 45 minutes face-to-face and non-face-to-face in the care of this patient, which includes all pre, intra, and post visit time on the date of service.  Cleon Dew, DNP, FNP-C  [facility]  [phone] (F)    Note - This record has been created using [employer]. Chart creation errors have been sought, but may not always have been located. Such creation errors do not reflect on the standard of medical care.

## 2020-06-11 NOTE — Unmapped (Signed)
Patient Education        Aprenda sobre el recuento de carbohidratos y comer afuera cuando tiene diabetes  Learning About Carbohydrate (Carb) Counting and Eating Out When You Have Diabetes  ??Por qu?? planificar las comidas?     La planificaci??n de comidas puede ser Neomia Dear parte crucial del manejo de la diabetes. Planificar las comidas y los refrigerios con el equilibrio correcto de carbohidratos, prote??nas y grasas puede ayudarle a Pharmacologist los niveles de az??car en la sangre dentro de los l??mites ideales que estableci?? junto con su m??dico.  No tiene que comer alimentos especiales. Puede comer lo mismo que su familia, incluso dulces de vez en cuando. Pero debe prestar atenci??n a la cantidad y la frecuencia con que come ciertos alimentos.  Tal vez desee colaborar con un dietista o un educador en diabetes certificado. ??l o ella puede darle consejos y sugerencias de comidas y puede responder a sus preguntas sobre la planificaci??n de comidas. Este profesional sanitario tambi??n puede ayudarle a alcanzar un peso saludable si ese es uno de sus objetivos.  ??Qu?? deber??a saber acerca de ingerir carbohidratos?  Manejar la cantidad de carbohidratos que ingiere es una parte importante de la alimentaci??n saludable cuando tiene diabetes. Los carbohidratos se encuentran en muchos alimentos.  ?? Sepa qu?? alimentos contienen carbohidratos. Y aprenda qu?? cantidad de carbohidratos contienen los diferentes alimentos.  ? El pan, los cereales, la pasta y el arroz tienen aproximadamente 15 gramos de carbohidratos por porci??n. Una porci??n equivale a 1 rebanada de pan (1 onza o 28 g), ?? taza de cereal cocido o 1/3 de taza de pasta o arroz cocidos.  ? Las frutas tienen 15 gramos de carbohidratos por porci??n. Josepha Pigg??n es 1 fruta fresca peque??a, como una manzana o una naranja; ?? banana (pl??tano); ?? taza de fruta cocida o enlatada; ?? taza de jugo de fruta; 1 taza de mel??n o frambuesas; o 2 cucharadas de frutas secas.  ? Rayetta Humphrey y el yogur sin az??car agregado tienen 15 gramos de carbohidratos por porci??n. Josepha Pigg??n es 1 taza de [place] o 2/3 taza de yogur sin az??car agregado.  ? Las verduras con almid??n tienen 15 gramos de carbohidratos por porci??n. Josepha Pigg??n es ?? taza de pur?? de papa o camote (batata, boniato); 1 taza de calabac??n; ?? papa horneada peque??a; ?? taza de frijoles cocidos; o ?? taza de ma??z (elote) o arvejas (ch??charos) cocidos.  ?? Aprenda cu??ntos carbohidratos debe consumir cada d??a y en cada comida. Un dietista o CDE le puede ense??ar c??mo llevar la cuenta de los carbohidratos que consume. A esto se le llama recuento de carbohidratos.  ?? Si no est?? seguro de c??mo contar los gramos de carbohidratos, utilice el M??todo del Plato para planificar las comidas. Es Craig Staggers buena y r??pida de asegurarse de que consuma comidas equilibradas. Tambi??n le ayuda a distribuir los carbohidratos durante el d??a.  ? Divida el plato por tipo de alimento. Llene medio plato con verduras sin almid??n, ponga carne u otras prote??nas en una cuarta parte del plato y granos o verduras con almid??n en el ??ltimo cuarto del plato. A esto puede agregarle un peque??o pedazo de fruta y 1 taza de [place] o yogur, seg??n la cantidad de carbohidratos que deba consumir en una comida.  ?? Trate de comer aproximadamente la misma cantidad de carbohidratos en cada comida. No reserve su cantidad diaria de carbohidratos para consumirlos en una sola comida.  ?? Las prote??nas contienen muy pocos o nada de carbohidratos por  porci??n. Los ejemplos de prote??nas incluyen carne de res, pollo, pavo, pescado, huevos, tofu, queso, reques??n (cottage cheese) y la mantequilla de IT consultant (man??). Josepha Pigg??n de carne son 3 onzas (85 g), lo cual es aproximadamente del tama??o de una baraja de naipes. Los ejemplos de porciones de sustitutos de la carne (equivalente a 1 onza o 28 g de carne) son 1/4 de taza de reques??n, 1 huevo, 1 cucharada de mantequilla de cacahuate y ?? taza de tofu.  ??C??mo puede comer fuera y a??n as?? comer de modo saludable?  ?? Aprenda a calcular los tama??os de las porciones de alimentos que contienen carbohidratos. Si mide la comida en casa, ser?? m??s f??cil calcular la cantidad en Josepha Pigg??n de comida de restaurante.  ?? Si el platillo que pide contiene demasiados carbohidratos (como papas, ma??z o frijoles al horno), pida un alimento bajo en carbohidratos en su lugar. Pida una ensalada o verduras.  ?? Si [place] insulina, revise su az??car en la sangre antes y despu??s de comer fuera para ayudarle a planear cu??nto comer en el futuro.  ?? Si usted come m??s carbohidratos de lo planeado en una comida, d?? un paseo o haga otro tipo de ejercicio. Esto ayudar?? a reducir Community education officer.  ??Cu??les son algunos consejos para comer sano?  ?? Limite las grasas saturadas, como la grasa de la carne y productos l??cteos. Esta es una opci??n saludable, porque las personas que tienen diabetes tienen un mayor riesgo de enfermedades del coraz??n. As?? que elija cortes magros de carne y productos l??cteos descremados o semidescremados. Utilice aceite de oliva o de canola en lugar de mantequilla o manteca al cocinar.  ?? No se salte comidas. Su nivel de az??car en la sangre puede bajar demasiado si usted se salta comidas y toma insulina o ciertos medicamentos para la diabetes.  ?? Consulte con su m??dico antes de beber alcohol. El alcohol puede hacer que su az??car en la sangre baje demasiado. El alcohol tambi??n puede causar una reacci??n adversa si usted toma ciertos medicamentos para la diabetes.  La atenci??n de seguimiento es una parte clave de su tratamiento y seguridad. Aseg??rese de hacer y acudir a todas las citas, y llame a su m??dico si est?? teniendo problemas. Tambi??n es una buena idea [employer] de sus ex??menes y [employer] de los medicamentos que toma.  ??D??nde puede encontrar m??s informaci??n en ingl??s?  Vaya a My[facility]Chart en [email or web address]  Seleccione??[facility] (Buscar en la biblioteca m??dica) en el Men??. Ingrese I147 en la casilla de b??squeda para obtener m??s informaci??n sobre ???Aprenda sobre el recuento de carbohidratos y comer afuera cuando tiene diabetes.???  Revisado: 28 julio, 2021??????????????????????????????Versi??n del contenido: 13.2  ?? 2006-2022 Healthwise, Incorporated.   Las instrucciones de cuidado fueron adaptadas bajo licencia por [employer]. Si usted tiene preguntas sobre una afecci??n m??dica o sobre estas instrucciones, siempre pregunte a su profesional de salud. Healthwise, Incorporated niega toda garant??a o responsabilidad por su uso de esta informaci??n.

## 2020-06-15 NOTE — Unmapped (Signed)
Jennesis's daughter helped interpret/answer questions. She reports her mom has done well on her new Humira. She's had no new HS flares, and she's had decreased swelling of her old areas.    Her daughter has helped give injections. I confirmed Dr. Orma Flaming was OK with her being off spironolactone for now given possibility of increased blood sugar as a side effect.    Maintenance dosing to start 4/4.     [facility] Clinical Assessment & Refill Coordination Note    Lorin Hauck, DOB: [birth year]  Phone: [phone] (home)     All above HIPAA information was verified with patient's family member, daughter, [name].     Was a Nurse, learning disability used for this call? No    Specialty Medication(s):   Inflammatory Disorders: Humira     Current Outpatient Medications   Medication Sig Dispense Refill   ??? alcohol swabs PadM Test daily before all meals/snacks and once before bedtime. 1 each 0   ??? atorvastatin (LIPITOR) 20 MG tablet Take 1 tablet (20 mg total) by mouth daily. 90 tablet 3   ??? BD INSULIN SYRINGE ULT-FINE II 1 mL 31 gauge x 5/16 Syrg 1 Package by Miscellaneous route Two (2) times a day. 100 each 3   ??? blood sugar diagnostic (ONETOUCH ULTRA BLUE TEST STRIP) Strp USE TO CHECK BLOOD SUGAR UP TO 10 TIMES DAILY AS NEEDED 600 strip 1   ??? blood-glucose meter kit Use as instructed 1 each 0   ??? clindamycin (CLEOCIN T) 1 % lotion Apply topically every morning. 60 mL 5   ??? dexamethasone (DECADRON) 0.1 % ophthalmic solution Administer 4 drops into the left ear Two (2) times a day for 30 days 5 mL 0   ??? dorzolamide-timoloL (COSOPT) 22.3-6.8 mg/mL ophthalmic solution Administer 1 drop to both eyes Two (2) times a day. 10 mL 12   ??? empagliflozin (JARDIANCE) 25 mg tablet Take 1 tablet (25 mg total) by mouth daily. 90 tablet 3   ??? empty container Misc Use as directed to dispose of Humira pens. 1 each 2   ??? folic acid/multivit-min/lutein (CENTRUM SILVER ORAL) Take 1 tablet by mouth daily at 0600.      ??? HUMIRA PEN CITRATE FREE 40 MG/0.4 ML Inject the contents of 1 pen (40 mg total) under the skin every seven (7) days. 4 each 11   ??? HUMIRA PEN CITRATE FREE STARTER PACK FOR CROHN'S/UC/HS 3 X 80 MG/0.8 ML Inject the contents of 2 pens (160 mg) under the skin on day 1, THEN 1 pen (80 mg) on day 15. 3 each 0   ??? insulin aspart protamine-insulin aspart (NOVOLOG 70/30) 100 unit/mL (70-30) injection pen Inject up to 80 units under the skin daily in divided doses as per MD instructions. (Patient taking differently: Inject 45 Units under the skin two (2) times a day. Inject up to 80 units under the skin daily in divided doses as per MD instructions.) 60 mL 4   ??? lancing device Misc USE 4 TIMES DAILY BEFORE MEALS AND NIGHTLY ( USAR CUATRO VECES AL DIA SPBM Y TODAS LAS NOCHES ) 1 each 1   ??? losartan (COZAAR) 25 MG tablet Take 1 tablet (25 mg total) by mouth daily. 90 tablet 3   ??? metFORMIN (GLUCOPHAGE) 1000 MG tablet Take 1 tablet (1,000 mg total) by mouth 2 (two) times a day with meals. 180 tablet 3   ??? omeprazole (PRILOSEC) 20 MG capsule Take 1 capsule (20 mg total) by mouth  daily. 90 capsule 3   ??? pen needle, diabetic (ULTICARE PEN NEEDLE) 32 gauge x 5/32 (4 mm) Ndle Use as directed two (2) times a day 100 each 12   ??? prednisoLONE acetate (PRED FORTE) 1 % ophthalmic suspension Apply 1 drop in the operative eye, four times per day 5 mL 0   ??? prednisoLONE acetate (PRED FORTE) 1 % ophthalmic suspension Administer 1 drop to both eyes four (4) times a day. 5 mL 2   ??? spironolactone (ALDACTONE) 25 MG tablet Take 2 tablets (50 mg total) by mouth daily for 14 days, THEN 4 tablets (100 mg total) daily. 120 tablet 5     No current facility-administered medications for this visit.     Facility-Administered Medications Ordered in Other Visits   Medication Dose Route Frequency Provider Last Rate Last Admin   ??? matrix hemostatic sealant (FLOSEAL) topical            ??? matrix hemostatic sealant (FLOSEAL) topical                 Changes to medications: Gidget reports no changes at this time.    No Known Allergies    Changes to allergies: No    SPECIALTY MEDICATION ADHERENCE     Humira - 0 left  Medication Adherence    Patient reported X missed doses in the last month: 0  Specialty Medication: Humira  Patient is on additional specialty medications: No          Specialty medication(s) dose(s) confirmed: Regimen is correct and unchanged.     Are there any concerns with adherence? No    Adherence counseling provided? Not needed    CLINICAL MANAGEMENT AND INTERVENTION      Clinical Benefit Assessment:    Do you feel the medicine is effective or helping your condition? Yes    Clinical Benefit counseling provided? Not needed    Adverse Effects Assessment:    Are you experiencing any side effects? No    Are you experiencing difficulty administering your medicine? No    Quality of Life Assessment:    How many days over the past month did your HS  keep you from your normal activities? For example, brushing your teeth or getting up in the morning. 0    Have you discussed this with your provider? Not needed    Therapy Appropriateness:    Is therapy appropriate? Yes, therapy is appropriate and should be continued    DISEASE/MEDICATION-SPECIFIC INFORMATION      For patients on injectable medications: Patient currently has 0 doses left.  Next injection is scheduled for 4/4.    PATIENT SPECIFIC NEEDS     - Does the patient have any physical, cognitive, or cultural barriers? No    - Is the patient high risk? No    - Does the patient require a Care Management Plan? No     - Does the patient require physician intervention or other additional services (i.e. nutrition, smoking cessation, social work)? No      SHIPPING     Specialty Medication(s) to be Shipped:   Inflammatory Disorders: Humira    Other medication(s) to be shipped: No additional medications requested for fill at this time     Changes to insurance: No    Delivery Scheduled: Yes, Expected medication delivery date: Wed, 3/30.     Medication will be delivered via Same Day Courier to the confirmed prescription address in [place].    The patient will  receive a drug information handout for each medication shipped and additional FDA Medication Guides as required.  Verified that patient has previously received a Conservation officer, historic buildings.    All of the patient's questions and concerns have been addressed.    Lanney Gins   [facility] Specialty Pharmacist

## 2020-06-24 MED FILL — HUMIRA PEN CITRATE FREE 40 MG/0.4 ML: SUBCUTANEOUS | 28 days supply | Qty: 4 | Fill #0

## 2020-06-30 ENCOUNTER — Ambulatory Visit
Admit: 2020-06-30 | Discharge: 2020-07-01 | Payer: PRIVATE HEALTH INSURANCE | Attending: Audiologist | Primary: Audiologist

## 2020-07-01 NOTE — Unmapped (Signed)
[facility]  Adult Audiology     [facility] Adult Audiology Hearing Aid Follow-up Appointment      PATIENT: Anne, Barber  DOB: September 21, 1960  MRN: [id number]  DOS: 06/30/2020     PLEASE SEE AUDIOGRAM INCLUDING FULL REPORT IN MEDIA MANAGER TAB.     HISTORY      Anne Barber is a 60 y.o. female with a known sensorineural  hearing loss seen today for hearing aid follow-up. Patient is unaccompanied to today's visit. A professional Spanish interpreter was available via iPad phone call. Today, patient reports difficulty with background noise and reports the sound is too sharp.    DEVICE INFORMATION      ?? Left Ear   Manufacturer Signia   Model Pure C&G T 3AX    Serial Number [id number]   HAF DATE 05/26/2020   Warranty 04/09/2023   Receiver strength and size 20M   Ear mold [id number]   Bluetooth Available   Battery Rechargeable   Charger Serial Number [id number]   Loss and Damage Claim  available   Other Notes: Anne Barber program       HEARING AID FOLLOW-UP      Otoscopy  RIGHT Ear: clear external auditory canal  LEFT Ear: clear external auditory canal    A listening check was completed and the hearing aids were found to be in good working order, without evidence of static, weakness, or distortion.     Physical Fit:  Hearing aid(s) continue to fit snugly in patient's ear comfortably and appropriately. Patient arrived with earmold not fully inserted into ear canal. Patient demonstrated insertion and was placing earmold in upside down and then twisting the receiver up to tuck the hearing aid behind the ear. Patient was counseled regarding full insertion of hearing aid and practiced this in the clinic today.    Programming:  Datalogging: 9 hours/day on average    Feedback management: Completed on 05/26/2020    Verification: NAL-NL2 - completed on 05/26/2020    Other programming changes: Soft level gain and high frequency gain decreased today x3. Speech and Noise management increased to maximum. Sound Smoothing increased to moderate. Gain globally decreased an additional x2 steps due to patient continuing to report sharp, uncomfortable sound quality.       The hearing aids were programmed with the following settings:  P1: Universal  P2: Telecoil    Other Features:  ??? Frequency Compression (transposition/lowering/shifting): Off  ??? Telecoil: On    User Settings:  ??? Toggle Function: volume control    BLUETOOTH CONNECTIVITY & SMARTPHONE APP   Hearing aids were paired with smartphone Bluetooth. Phone streaming was reviewed and practiced in the clinic today.   Hearing aids remain paired with the Signia App. Anne Barber showed ability to use App with aids.    COUNSELING     The following concepts regarding hearing aid management/maintenance were discussed:  [x]  Device re-orientation: insertion/removal from ear and/or charger, battery replacement, cleaning strategies including wax filter/dome replacement (if applicable)  []  Warranty information - Counseled on repair warranty and one-time loss and damage warranty. Counseled regarding costs associated with out-of-warranty hearing aid services.  []  Hearing aid/Earmold cleaning - Demonstrated use of wax removal loop and brush. Practiced wax filter replacements. Counseled on use of a soft, dry cloth to clean the body of the hearing aid.   []  Hearing aid storage - Dehumidifier should be used nightly when hearing aid(s) are taken off before bed. Counseled how and when to replace the drying  tablets. Instructed that the hearing aid(s) should be in the hearing aid case/charger or dehumidifier if not being worn.  []  Troubleshooting Strategies ??? Counseled on battery check as first for fresh or fully charged battery, check for wax blocking earmold or dome   [x]  Notified of walk-in clinic hours ([place] location: Fridays from 1-3:30pm, [place] location: Wednesdays)  [x]  Acclimatization process and importance of full time use of the device     Anne Barber was encouraged to contact the Adult Hearing Aid Program at [phone] for device troubleshooting questions or supplies as needed.         RECOMMENDATIONS   ??? Full time use of amplification   ??? Return to clinic in approximately 3 months for hearing aid follow-up  - will review cleaning and wax filter replacement at this time  ??? Contact the Adult Hearing Aid Dispensing Clinic at [phone] or attend the Adult Hearing Aid Walk-in Clinic ([place]: Fridays 1-3:30pm, [place]: Wednesdays) for hearing aid troubleshooting help or supplies (wax filters, domes, etc.)      [employer], B.S.  Audiology Graduate Student Clinician    I was physically present and immediately available to direct and supervise tasks that were related to patient management. The direction and supervision was continuous throughout the time these tasks were performed.    Care Provider(s) wore appropriate PPE throughout entire appointment (face mask and eye protection). Patient did wear face mask appropriately for entirety of appointment.       Charges associated with today's visit:  ??? HC No Charge; Qty: 4    Visit Time: 60 min    Collene Leyden, AuD  Clinical Audiologist  [facility]  Scheduling: [phone]

## 2020-07-02 ENCOUNTER — Ambulatory Visit: Admit: 2020-07-02 | Discharge: 2020-07-03 | Payer: PRIVATE HEALTH INSURANCE

## 2020-07-03 NOTE — Unmapped (Signed)
Hello,  Your mammogram is negative/normal.    He will need to be repeated in 1 year    Thanks,AM

## 2020-07-15 MED ORDER — INSULIN ASPAR PROT-INSULIN ASPART 100 UNIT/ML (70-30) SUBCUTANEOUS PEN
4 refills | 0 days
Start: 2020-07-15 — End: ?

## 2020-07-15 MED ORDER — PEN NEEDLE, DIABETIC 32 GAUGE X 5/32" (4 MM)
12 refills | 0 days
Start: 2020-07-15 — End: ?

## 2020-07-15 NOTE — Unmapped (Signed)
[facility] Specialty Pharmacy Refill Coordination Note    Specialty Medication(s) to be Shipped:   Inflammatory Disorders: Humira    Other medication(s) to be shipped: Novolog, Pen needles,Omeprazole,Atorvastatin,Prednisolone eye drops, Metformin     Anne Barber, DOB: 08/06/1960  Phone: [phone] (home)       All above HIPAA information was verified with patient's family member, daughter [name].     Was a Nurse, learning disability used for this call? No    Completed refill call assessment today to schedule patient's medication shipment from the [facility] [phone]).  All relevant notes have been reviewed.     Specialty medication(s) and dose(s) confirmed: Regimen is correct and unchanged.   Changes to medications: [place]ale reports no changes at this time.  Changes to insurance: No  New side effects reported not previously addressed with a pharmacist or physician: None reported  Questions for the pharmacist: No    Confirmed patient received a Conservation officer, historic buildings and a Surveyor, mining with first shipment. The patient will receive a drug information handout for each medication shipped and additional FDA Medication Guides as required.       DISEASE/MEDICATION-SPECIFIC INFORMATION        For patients on injectable medications: Patient currently has 1 doses left.  Next injection is scheduled for 07/20/2020.    SPECIALTY MEDICATION ADHERENCE     Medication Adherence    Patient reported X missed doses in the last month: 0  Specialty Medication: Humira CF 40 mg/0.4 ml   Patient is on additional specialty medications: No  Any gaps in refill history greater than 2 weeks in the last 3 months: no  Demonstrates understanding of importance of adherence: yes  Informant: child/children  Reliability of informant: reliable  Confirmed plan for next specialty medication refill: delivery by pharmacy  Refills needed for supportive medications: not needed              Were doses missed due to medication being on hold? No    Humira CF 40/0.4 mg/ml: 7 days of medicine on hand       REFERRAL TO PHARMACIST     Referral to the pharmacist: Not needed      [facility]     Shipping address confirmed in Epic.     Delivery Scheduled: Yes, Expected medication delivery date: 07/17/2020.     Medication will be delivered via Same Day Courier to the prescription address in Epic WAM.    Nysir Fergusson D Danne Scardina   [facility] Specialty Technician

## 2020-07-16 MED ORDER — PEN NEEDLE, DIABETIC 32 GAUGE X 5/32" (4 MM)
Freq: Two times a day (BID) | SUBCUTANEOUS | 12 refills | 50 days | Status: CP
Start: 2020-07-16 — End: ?

## 2020-07-17 MED ORDER — INSULIN ASPAR PROT-INSULIN ASPART 100 UNIT/ML (70-30) SUBCUTANEOUS PEN
4 refills | 0 days
Start: 2020-07-17 — End: ?

## 2020-07-17 MED FILL — ULTICARE PEN NEEDLE 32 GAUGE X 5/32" (4 MM): SUBCUTANEOUS | 50 days supply | Qty: 100 | Fill #0

## 2020-07-17 MED FILL — OMEPRAZOLE 20 MG CAPSULE,DELAYED RELEASE: ORAL | 90 days supply | Qty: 90 | Fill #3

## 2020-07-17 MED FILL — PREDNISOLONE ACETATE 1 % EYE DROPS,SUSPENSION: OPHTHALMIC | 25 days supply | Qty: 5 | Fill #1

## 2020-07-17 MED FILL — HUMIRA PEN CITRATE FREE 40 MG/0.4 ML: SUBCUTANEOUS | 28 days supply | Qty: 4 | Fill #1

## 2020-07-17 MED FILL — METFORMIN 1,000 MG TABLET: ORAL | 90 days supply | Qty: 180 | Fill #1

## 2020-07-17 MED FILL — ATORVASTATIN 20 MG TABLET: ORAL | 90 days supply | Qty: 90 | Fill #3

## 2020-07-17 NOTE — Unmapped (Signed)
I have not seen this patient in almost 2 years. She should get refills from PCP or whoever is managing her diabetes now.

## 2020-07-24 ENCOUNTER — Ambulatory Visit
Admit: 2020-07-24 | Discharge: 2020-07-25 | Payer: PRIVATE HEALTH INSURANCE | Attending: Student in an Organized Health Care Education/Training Program | Primary: Student in an Organized Health Care Education/Training Program

## 2020-07-24 DIAGNOSIS — H2000 Unspecified acute and subacute iridocyclitis: Principal | ICD-10-CM

## 2020-07-24 MED ORDER — INSULIN ASPAR PROT-INSULIN ASPART 100 UNIT/ML (70-30) SUBCUTANEOUS PEN
Freq: Two times a day (BID) | SUBCUTANEOUS | 11 refills | 33 days | Status: CP
Start: 2020-07-24 — End: ?
  Filled 2020-07-29: qty 30, 33d supply, fill #0
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
## 2020-07-24 NOTE — Unmapped (Signed)
Drug Eye Color Breakfast Lunch Supper Bedtime Directions   Prednisolone Acetate Right Pink or White X X X X For 1 week, THEN:   Prednisolone Acetate Right Pink or White X  X X For 1 week, THEN:   Prednisolone Acetate Right Pink or White X  X  For 1 week, THEN:   Prednisolone Acetate Right Pink or White X    For 1 week, THEN STOP                                               Remember to shake your bottle of Prednisolone

## 2020-07-24 NOTE — Unmapped (Signed)
Patient is requesting the following refill  Requested Prescriptions     Pending Prescriptions Disp Refills   ??? insulin aspart protamine-insulin aspart (NOVOLOG 70/30) 100 unit/mL (70-30) injection pen 60 mL 4     Sig: Inject up to 80 units under the skin daily in divided doses as per MD instructions.       Recent Visits  Date Type Provider Dept   06/11/20 Office Visit Deneise Lever, FNP [facility]   Showing recent visits within past 365 days with a meds authorizing provider and meeting all other requirements  Future Appointments  Date Type Provider Dept   09/16/20 Appointment Deneise Lever, FNP [place] [facility]   12/31/20 Appointment Deneise Lever, FNP [place] [facility]   Showing future appointments within next 365 days with a meds authorizing provider and meeting all other requirements

## 2020-08-13 MED ORDER — HUMIRA PEN CITRATE FREE 40 MG/0.4 ML
SUBCUTANEOUS | 11 refills | 56.00000 days
Start: 2020-08-13 — End: 2021-08-13

## 2020-08-14 ENCOUNTER — Ambulatory Visit: Admit: 2020-08-14 | Discharge: 2020-08-15 | Payer: PRIVATE HEALTH INSURANCE

## 2020-08-14 DIAGNOSIS — L732 Hidradenitis suppurativa: Principal | ICD-10-CM

## 2020-08-14 DIAGNOSIS — L738 Other specified follicular disorders: Principal | ICD-10-CM

## 2020-08-14 MED ORDER — HUMIRA PEN CITRATE FREE 40 MG/0.4 ML
SUBCUTANEOUS | 11 refills | 56.00000 days | Status: CP
Start: 2020-08-14 — End: 2021-08-14
  Filled 2020-08-20: qty 4, 28d supply, fill #0

## 2020-08-14 NOTE — Unmapped (Addendum)
Gracias para visitarnos en clinica hoy    1) Hidradenitis suppurativa   - Continuar Humira inyecciones    - Continuar clindamycin    - Comience a usar el champu Nizoral como gel de ba??o       Aprenda sobre la hidradenitis supurativa  Learning About Hidradenitis Suppurativa  ??Qu?? es la hidradenitis supurativa?     La hidradenitis, o hidrosadenitis, supurativa es una afecci??n cut??nea que causa bultos en la piel. Los bultos parecen granos o for??nculos. La afecci??n puede aparecer y desaparecer por muchos a??os.  Los m??dicos no saben exactamente c??mo comienza esta afecci??n. Pero s?? saben que algo irrita e inflama los fol??culos pilosos, haciendo que se hinchen y formen bultos. Esta afecci??n cut??nea no puede transmitirse de persona a persona (no es contagiosa).  ??Cu??les son los s??ntomas?  Aparecen en la piel bultos rojos que parecen granos, acn?? o for??nculos y que suelen doler. Los bultos:  ?? Suelen aparecer en zonas donde hay rozamiento de piel contra piel, como en la axila. Tambi??n pueden aparecer bajo los pechos, en la ingle, en las nalgas, alrededor del ano y en la parte interior de los muslos.  ?? Pueden desaparecer por s?? solos en unas semanas. Pero a menudo reaparecen en la misma zona.  ?? Pueden infectarse y reventarse, drenando sangre y pus que generalmente huele mal.  Si la afecci??n no se trata y [place], pueden formarse t??neles huecos bajo la piel. Con el tiempo, la infecci??n y los t??neles sanar??n, pero puede formarse una cicatriz gruesa. Estas cicatrices pueden impedir que la piel se estire naturalmente.  ??C??mo se trata la hidradenitis supurativa?  Su tratamiento depende de la gravedad de la afecci??n. Su m??dico tal vez hable de:  ?? Medicamentos. Usted podr??a Psychologist, sport and exercise, como antibi??ticos, o frotarse una pomada o crema recetada sobre la piel afectada.  ?? Inyecciones de corticosteroides. Tal vez le apliquen inyecciones en las zonas afectadas.  ?? Pastillas con hormonas. A algunas mujeres las ayuda tomar pastillas anticonceptivas u otros medicamentos que afectan sus hormonas.  ?? Extraer el tejido infectado.  Tratamiento en el hogar  El tratamiento en el hogar puede brindar alivio del dolor y ayudar a prevenir que reaparezcan los n??dulos. El tratamiento en el hogar incluye:  ?? Usar prendas de Psychologist, sport and exercise.  ?? Lavarse la zona suavemente con un jab??n neutro.  ?? Mantenerse en un peso saludable. Si tiene sobrepeso, Geophysical data processor puede ayudar con la afecci??n.  ?? Dejar de fumar, si fuma.  La atenci??n de seguimiento es una parte clave de su tratamiento y seguridad. Aseg??rese de hacer y acudir a todas las citas, y llame a su m??dico si est?? teniendo problemas. Tambi??n es una buena idea [employer] de sus ex??menes y [employer] de los medicamentos que toma.  ??D??nde puede encontrar m??s informaci??n en ingl??s?  Vaya a My[facility]Chart en [email or web address]  Seleccione??[facility] (Buscar en la biblioteca m??dica) en el Men??. Ingrese J430 en la casilla de b??squeda para obtener m??s informaci??n sobre ???Aprenda sobre la hidradenitis supurativa.???  Revisado: 15 noviembre, 2021??????????????????????????????Versi??n del contenido: 13.2  ?? 2006-2022 Healthwise, Incorporated.   Las instrucciones de cuidado fueron adaptadas bajo licencia por [employer]. Si usted tiene preguntas sobre una afecci??n m??dica o sobre estas instrucciones, siempre pregunte a su profesional de salud. Healthwise, Incorporated niega toda garant??a o responsabilidad por su uso de esta informaci??n.

## 2020-08-17 MED ORDER — KETOCONAZOLE 2 % SHAMPOO
TOPICAL | 11 refills | 0.00000 days | Status: CP
Start: 2020-08-17 — End: 2020-09-16

## 2020-08-18 NOTE — Unmapped (Signed)
[facility] Pharm[facility] Refill Coordination Note    Specialty Medication(s) to be Shipped:   Inflammatory Disorders: Humira    Other medication(s) to be shipped: No additional medications requested for fill at this time     Anne Barber, DOB: [birth year]  Phone: [phone] (home)       All above HIPAA information was verified with patient's family member, daughter.     Was a Nurse, learning disability used for this call? No    Completed refill call assessment today to schedule patient's medication shipment from the [facility] [phone]).  All relevant notes have been reviewed.     Specialty medication(s) and dose(s) confirmed: Regimen is correct and unchanged.   Changes to medications: Cadience reports no changes at this time.  Changes to insurance: No  New side effects reported not previously addressed with a pharmacist or physician: None reported  Questions for the pharmacist: No    Confirmed patient received a Conservation officer, historic buildings and a Surveyor, mining with first shipment. The patient will receive a drug information handout for each medication shipped and additional FDA Medication Guides as required.       DISEASE/MEDICATION-SPECIFIC INFORMATION        For patients on injectable medications: Patient currently has 0 doses left.  Next injection is scheduled for 05/30.    SPECIALTY MEDICATION ADHERENCE     Medication Adherence    Patient reported X missed doses in the last month: 0  Specialty Medication: Humira CF 40 mg/0.4 ml  Patient is on additional specialty medications: No  Any gaps in refill history greater than 2 weeks in the last 3 months: no  Demonstrates understanding of importance of adherence: yes  Informant: child/children  Reliability of informant: reliable  Provider-estimated medication adherence level: good  Patient is at risk for Non-Adherence: No  Confirmed plan for next specialty medication refill: delivery by pharmacy  Refills needed for supportive medications: not needed Were doses missed due to medication being on hold? No    humira 40/0.4 mg/ml: 0 days of medicine on hand         REFERRAL TO PHARMACIST     Referral to the pharmacist: Not needed      [facility]     Shipping address confirmed in Epic.     Delivery Scheduled: Yes, Expected medication delivery date: 05/26.     Medication will be delivered via Same Day Courier to the prescription address in Epic WAM.    Antonietta Barcelona   [facility] Specialty Technician

## 2020-08-20 ENCOUNTER — Ambulatory Visit
Admit: 2020-08-20 | Discharge: 2020-08-21 | Payer: PRIVATE HEALTH INSURANCE | Attending: Student in an Organized Health Care Education/Training Program | Primary: Student in an Organized Health Care Education/Training Program

## 2020-08-20 DIAGNOSIS — E113599 Type 2 diabetes mellitus with proliferative diabetic retinopathy without macular edema, unspecified eye: Principal | ICD-10-CM

## 2020-08-20 DIAGNOSIS — H4311 Vitreous hemorrhage, right eye: Principal | ICD-10-CM

## 2020-08-20 NOTE — Unmapped (Signed)
Anne Barber is here to follow-up proliferative diabetic retinopathy both eyes    1. PDR, Both Eyes  - Diagnosed ~30 years ago  - Last A1c 7.9  - Treatment: insulin injections    2. PDR with [facility] both eyes    S/p PPV/EL/partial AFX right eye 06/26/19 Zhang/Deans  S/p PPV/EL/Air left eye  08/10/16 Farber/Zhang   New dense VH OD without RD  No DME both eyes     RTC 1 month    3. History of Neovascular Glaucoma OD, indeterminate stage  - s/p emergent Ahmed Valve 10/12/13, valve functioning with tense overlying bleb  Patient bothered by bleb, will ask Dr. Marcell Anger to take a look at Feb appointment if anything can be done  - IOP elevated OU, states she is no longer on cosopt   - will follow with Dr. Marcell Anger    4. Pseudophakia both eyes   Stable. Monitor.   Seeing DF for glasses/MRx    5. Dry Eyes, OU  -- Likely contributing to intermittent blurry vision, OU  -- c/w ATs QID, Refresh PM qHS, [place], and warm compresses   -- today under control.     Plan:  RTC 1 month Retina CAP for V/T/D/mac OCT    Pt discussed with Dr. [name] Patten    Extended ophthalmoscopy report (90D)  Macula - right:  no view, PRP laser  Macula - left:  flat, attached, MAs, DBH, PRP laser

## 2020-08-20 NOTE — Unmapped (Signed)
Attestation     I discussed the findings, assessment, and plan with the resident and agree with the findings and plan as documented in the resident's note.    Britt Bottom

## 2020-09-09 NOTE — Unmapped (Addendum)
[facility] Refill Coordination Note    Specialty Medication(s) to be Shipped:   Inflammatory Disorders: Humira    Other medication(s) to be shipped: Novolog, Pen Needles, Losartan, Jardiance and Prednisolone eye drops     Anne Barber, DOB: 25-Mar-1961  Phone: [phone] (home)       All above HIPAA information was verified with patient's family member, daughter and patient.     Was a Nurse, learning disability used for this call? No    Completed refill call assessment today to schedule patient's medication shipment from the [facility] [phone]).  All relevant notes have been reviewed.     Specialty medication(s) and dose(s) confirmed: Regimen is correct and unchanged.   Changes to medications: Becki reports no changes at this time.  Changes to insurance: No  New side effects reported not previously addressed with a pharmacist or physician: None reported  Questions for the pharmacist: No    Confirmed patient received a Conservation officer, historic buildings and a Surveyor, mining with first shipment. The patient will receive a drug information handout for each medication shipped and additional FDA Medication Guides as required.       DISEASE/MEDICATION-SPECIFIC INFORMATION        For patients on injectable medications: Patient currently has 1 doses left.  Next injection is scheduled for 09/14/2020.    SPECIALTY MEDICATION ADHERENCE     Medication Adherence    Patient reported X missed doses in the last month: 0  Specialty Medication: Humira CF 40 mg/0.4 ml  Patient is on additional specialty medications: No  Any gaps in refill history greater than 2 weeks in the last 3 months: no  Demonstrates understanding of importance of adherence: yes  Informant: child/children  Reliability of informant: reliable  Confirmed plan for next specialty medication refill: delivery by pharmacy  Refills needed for supportive medications: not needed              Were doses missed due to medication being on hold? No    humira 40/0.4 mg/ml: 7 days of medicine on hand         REFERRAL TO PHARMACIST     Referral to the pharmacist: Not needed      [facility]     Shipping address confirmed in Epic.     Delivery Scheduled: Yes, Expected medication delivery date: 09/11/2020.     Medication will be delivered via Same Day Courier to the prescription address in Epic WAM.    Auston Halfmann D Marquette Blodgett   [facility] Specialty Technician

## 2020-09-11 MED FILL — PREDNISOLONE ACETATE 1 % EYE DROPS,SUSPENSION: OPHTHALMIC | 25 days supply | Qty: 5 | Fill #2

## 2020-09-11 MED FILL — JARDIANCE 25 MG TABLET: ORAL | 90 days supply | Qty: 90 | Fill #1

## 2020-09-11 MED FILL — ULTICARE PEN NEEDLE 32 GAUGE X 5/32" (4 MM): SUBCUTANEOUS | 50 days supply | Qty: 100 | Fill #1

## 2020-09-11 MED FILL — LOSARTAN 25 MG TABLET: ORAL | 90 days supply | Qty: 90 | Fill #1

## 2020-09-11 MED FILL — HUMIRA PEN CITRATE FREE 40 MG/0.4 ML: SUBCUTANEOUS | 28 days supply | Qty: 4 | Fill #1

## 2020-09-11 MED FILL — NOVOLOG MIX 70-30 FLEXPEN U-100 INSULIN 100 UNIT/ML SUBCUTANEOUS PEN: SUBCUTANEOUS | 33 days supply | Qty: 30 | Fill #1

## 2020-09-14 ENCOUNTER — Ambulatory Visit
Admit: 2020-09-14 | Discharge: 2020-09-15 | Payer: PRIVATE HEALTH INSURANCE | Attending: Audiologist | Primary: Audiologist

## 2020-09-14 NOTE — Unmapped (Signed)
[facility]  Adult Audiology     [facility] Adult Audiology Hearing Aid Follow-up Appointment      PATIENT: Anne Barber, Anne Barber  DOB: 10/07/1960  MRN: [id number]  DOS: 09/14/2020        HISTORY      Anne Barber is a 60 y.o. female with a known  hearing loss seen today for hearing aid follow-up. Patient was accompanied by her grandson's fiance to today's appointment.  Today's appointment was facilitated by a certified Spanish interpreter. Patient reports that the Bluetooth connectivity is not working and that she would like it to be connected again. She also reports that she would like more volume overall in the programming. She reports occasional headaches after wearing the hearing aid all day but finds relief by taking it out and resting her head.      DEVICE INFORMATION      ?? Left Ear   Manufacturer [employer]ia   Model Pure C&G T 3AX    Serial Number [id number]   HAF DATE 05/26/2020   Warranty 04/09/2023   Receiver strength and size 34M   Ear mold [id number]   Bluetooth Available   Battery Rechargeable   Charger Serial Number [id number] [phone]   Loss and Damage Claim  available   Other Notes: Ch[employer] DSD[facility] program     HEARING AID FOLLOW-UP      Programming:  Master gain increased x1.  Hearing aids re-paired to the [employer]ia App.    COUNSELING     The following concepts regarding hearing aid management/maintenance were discussed:  [x]  Device re-orientation: insertion/removal from ear and/or charger, battery replacement, cleaning strategies including wax filter/dome replacement (if applicable)  [x]  Warranty information - Counseled on repair warranty and one-time loss and damage warranty. Counseled regarding costs associated with out-of-warranty hearing aid services.  [x]  Hearing aid/Earmold cleaning - Demonstrated use of wax removal loop and brush. Practiced wax filter replacements. Counseled on use of a soft, dry cloth to clean the body of the hearing aid.   [x]  Tinnitus - incorporating the use of sound machines at night to mix with tinnitus  [x]  Troubleshooting Strategies - Counseled on battery check as first for fresh or fully charged battery, check for wax blocking earmold or dome   [x]  ENT should concerns arise regarding pain  []  Acclimatization process and importance of full time use of the device       Anne Barber was encouraged to contact the Adult Hearing Aid Program at [phone] for device troubleshooting questions or supplies as needed.         RECOMMENDATIONS   ??? Full time use of amplification   ??? Contact clinic should concerns arise  ??? Return to clinic in approximately 5 for hearing evaluation and hearing aid follow-up      [address] [address]  Audiology Graduate Student Clinician    I was physically present and immediately available to direct and supervise tasks that were related to patient management. The direction and supervision was continuous throughout the time these tasks were performed.    Care Provider(s) wore appropriate PPE throughout entire appointment (face mask and eye protection). Patient's companion also wore face mask appropriately for the entire appointment. Patient did wear face mask appropriately for entirety of appointment.    Charges associated with today's visit:  ??? HC No Charge; Qty: 4    Visit Time: 60 min    Shanikka Wonders R Shafter Jupin, AuD  Clinical Audiologist  [facility] Adult Audiology Program  Scheduling: [phone]

## 2020-09-16 ENCOUNTER — Ambulatory Visit: Admit: 2020-09-16 | Payer: PRIVATE HEALTH INSURANCE

## 2020-09-24 ENCOUNTER — Ambulatory Visit
Admit: 2020-09-24 | Discharge: 2020-09-25 | Payer: PRIVATE HEALTH INSURANCE | Attending: Student in an Organized Health Care Education/Training Program | Primary: Student in an Organized Health Care Education/Training Program

## 2020-09-24 DIAGNOSIS — E113599 Type 2 diabetes mellitus with proliferative diabetic retinopathy without macular edema, unspecified eye: Principal | ICD-10-CM

## 2020-09-24 DIAGNOSIS — H4311 Vitreous hemorrhage, right eye: Principal | ICD-10-CM

## 2020-09-24 NOTE — Unmapped (Signed)
Mrs. Vetrano is here to follow-up proliferative diabetic retinopathy both eyes    1. PDR, Both Eyes  - Diagnosed ~30 years ago  - Last A1c 7.9  - Treatment: insulin injections    2. Vitreous Hemorrhage OD (OS resolved)  S/p PPV/EL/partial AFX right eye 06/26/19 Zhang/Deans  S/p PPV/EL/Air left eye  08/10/16 Farber/Zhang   VH (new on 08/20/20) improving but still present OD with VA CF  No DME both eyes on OCT macula    Discussed options of observation vs PPV and patient would like to pursue PPV for [facility]. Earliest is August 8th with Dr. Beckey Downing and patient was informed that if it clears before then, she can cancel the week before.    Plan:  - PPV for [facility] OD on August 8th with Dr. Beckey Downing  - no preop avastin  - RTC POD1    3. History of Neovascular Glaucoma OD, indeterminate stage  - s/p emergent Ahmed Valve 10/12/13, valve functioning with tense overlying bleb  Patient bothered by bleb, will ask Dr. Marcell Anger to take a look at Feb appointment if anything can be done  - IOP elevated OU, states she is no longer on cosopt and has mild pain today  - still on pred forte OD QID  - will follow with Dr. Marcell Anger tomorrow    *not addressed today*    4. Pseudophakia both eyes   Stable. Monitor.   Seeing DF for glasses/MRx    5. Dry Eyes, OU  -- Likely contributing to intermittent blurry vision, OU  -- c/w ATs QID, Refresh PM qHS, Genteal, and warm compresses   -- today under control.

## 2020-09-25 ENCOUNTER — Ambulatory Visit
Admit: 2020-09-25 | Discharge: 2020-09-26 | Payer: PRIVATE HEALTH INSURANCE | Attending: Student in an Organized Health Care Education/Training Program | Primary: Student in an Organized Health Care Education/Training Program
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
## 2020-09-29 MED ORDER — OMEPRAZOLE 20 MG CAPSULE,DELAYED RELEASE
ORAL_CAPSULE | Freq: Every day | ORAL | 1 refills | 90.00000 days | Status: CP
Start: 2020-09-29 — End: 2021-09-29
  Filled 2020-10-12: qty 90, 90d supply, fill #0

## 2020-09-29 MED ORDER — ATORVASTATIN 20 MG TABLET
ORAL_TABLET | Freq: Every day | ORAL | 1 refills | 90 days | Status: CP
Start: 2020-09-29 — End: 2021-09-29
  Filled 2020-10-12: qty 90, 90d supply, fill #0

## 2020-09-29 NOTE — Unmapped (Signed)
Attestation:  I was present with resident during the history and exam.  I discussed the findings, assessment, and plan with the resident and agree with the findings and plan as documented in the resident's note.    Anne Barber

## 2020-09-29 NOTE — Unmapped (Signed)
Patient is requesting the following refill  Requested Prescriptions     Pending Prescriptions Disp Refills   ??? omeprazole (PRILOSEC) 20 MG capsule 90 capsule 3     Sig: Take 1 capsule (20 mg total) by mouth daily.   ??? atorvastatin (LIPITOR) 20 MG tablet 90 tablet 3     Sig: Take 1 tablet (20 mg total) by mouth daily.       Recent Visits  Date Type Provider Dept   06/11/20 Office Visit Deneise Lever, FNP [facility] [facility]   Showing recent visits within past 365 days with a meds authorizing provider and meeting all other requirements  Future Appointments  Date Type Provider Dept   12/31/20 Appointment Deneise Lever, FNP [facility]   Showing future appointments within next 365 days with a meds authorizing provider and meeting all other requirements

## 2020-09-30 MED ORDER — PREDNISOLONE ACETATE 1 % EYE DROPS,SUSPENSION
Freq: Four times a day (QID) | OPHTHALMIC | 2 refills | 25 days | Status: CP
Start: 2020-09-30 — End: 2020-10-30

## 2020-10-06 NOTE — Unmapped (Signed)
[facility] Refill Coordination Note    Specialty Medication(s) to be Shipped:   Inflammatory Disorders: Humira    Other medication(s) to be shipped: Novolog, Omeprazole, Metformin and Atorvastatin     Anne Barber, DOB: 13-Oct-1960  Phone: [phone] (home)       All above HIPAA information was verified with patient's family member, daughter and patient.     Was a Nurse, learning disability used for this call? No    Completed refill call assessment today to schedule patient's medication shipment from the [facility] [phone]).  All relevant notes have been reviewed.     Specialty medication(s) and dose(s) confirmed: Regimen is correct and unchanged.   Changes to medications: Anne Barber reports no changes at this time.  Changes to insurance: No  New side effects reported not previously addressed with a pharmacist or physician: None reported  Questions for the pharmacist: No    Confirmed patient received a Conservation officer, historic buildings and a Surveyor, mining with first shipment. The patient will receive a drug information handout for each medication shipped and additional FDA Medication Guides as required.       DISEASE/MEDICATION-SPECIFIC INFORMATION        For patients on injectable medications: Patient currently has 1 doses left.  Next injection is scheduled for 10/12/2020.    SPECIALTY MEDICATION ADHERENCE     Medication Adherence    Patient reported X missed doses in the last month: 0  Specialty Medication: Humira CF 40 mg/0.4 ml  Patient is on additional specialty medications: No  Any gaps in refill history greater than 2 weeks in the last 3 months: no  Demonstrates understanding of importance of adherence: yes  Informant: child/children  Reliability of informant: reliable  Confirmed plan for next specialty medication refill: delivery by pharmacy  Refills needed for supportive medications: not needed              Were doses missed due to medication being on hold? No    humira 40/0.4 mg/ml: 7 days of medicine on hand         REFERRAL TO PHARMACIST     Referral to the pharmacist: Not needed      [facility]     Shipping address confirmed in Epic.     Delivery Scheduled: Yes, Expected medication delivery date: 10/12/2020.     Medication will be delivered via Same Day Courier to the prescription address in Epic WAM.    Anne Barber   [facility] Specialty Technician

## 2020-10-07 NOTE — Unmapped (Addendum)
[facility] was paged regarding [name] at 3:06 PM.    I returned the page and spoke with Murlean Iba, the patient's daughter.    We discussed the following:    Noelene Rines states that her vision has cleared up a lot since her last visit and wanted to see if she could make an appointment instead of having surgery. I told [name] that we could make an appointment next month and cancel the surgery and she and Gabrella were in agreement.    A message has been sent to the schedulers to make the appointment and I will touch base with Dr. Beckey Downing to let him know that patient would like to cancel.    Abelina Bachelor, MD  PGY-3 Department of Ophthalmology

## 2020-10-12 MED FILL — NOVOLOG MIX 70-30 FLEXPEN U-100 INSULIN 100 UNIT/ML SUBCUTANEOUS PEN: SUBCUTANEOUS | 33 days supply | Qty: 30 | Fill #2

## 2020-10-12 MED FILL — METFORMIN 1,000 MG TABLET: ORAL | 90 days supply | Qty: 180 | Fill #2

## 2020-10-12 MED FILL — HUMIRA PEN CITRATE FREE 40 MG/0.4 ML: SUBCUTANEOUS | 28 days supply | Qty: 4 | Fill #2

## 2020-11-03 ENCOUNTER — Ambulatory Visit: Admit: 2020-11-03 | Discharge: 2020-11-04 | Payer: PRIVATE HEALTH INSURANCE

## 2020-11-03 DIAGNOSIS — Z794 Long term (current) use of insulin: Principal | ICD-10-CM

## 2020-11-03 DIAGNOSIS — H547 Unspecified visual loss: Principal | ICD-10-CM

## 2020-11-03 DIAGNOSIS — I10 Essential (primary) hypertension: Principal | ICD-10-CM

## 2020-11-03 DIAGNOSIS — R5383 Other fatigue: Principal | ICD-10-CM

## 2020-11-03 DIAGNOSIS — E11319 Type 2 diabetes mellitus with unspecified diabetic retinopathy without macular edema: Principal | ICD-10-CM

## 2020-11-03 DIAGNOSIS — E785 Hyperlipidemia, unspecified: Principal | ICD-10-CM

## 2020-11-03 DIAGNOSIS — L732 Hidradenitis suppurativa: Principal | ICD-10-CM

## 2020-11-03 DIAGNOSIS — R059 Cough: Principal | ICD-10-CM

## 2020-11-03 LAB — ALBUMIN / CREATININE URINE RATIO
ALBUMIN QUANT URINE: <0.3 mg/dL
CREATININE, URINE: 39.7 mg/dL

## 2020-11-03 MED ORDER — FLUTICASONE PROPIONATE 50 MCG/ACTUATION NASAL SPRAY,SUSPENSION
Freq: Two times a day (BID) | NASAL | 0 refills | 0 days | Status: CP
Start: 2020-11-03 — End: 2021-11-03
  Filled 2020-11-04: qty 16, 30d supply, fill #0

## 2020-11-03 MED ORDER — BENZONATATE 200 MG CAPSULE
ORAL_CAPSULE | Freq: Every evening | ORAL | 0 refills | 7 days | Status: CP | PRN
Start: 2020-11-03 — End: 2020-11-23
  Filled 2020-11-04: qty 20, 20d supply, fill #0

## 2020-11-03 NOTE — Unmapped (Signed)
TosNeomia Dear espray de su nariz--una spray cada lado dos veces al dia  Medicina para reducir su tos--tomalo en los noches.  Su azugar esta bien: A1c: 7.6 (mejor)  Vamos a pedir citas para MRI de su cerebral y [address] de los vas [place] de su cuello.  Gracias.

## 2020-11-03 NOTE — Unmapped (Signed)
Assessment and Plan:     Anne Barber was seen today for back pain, cough and fatigue.    Diagnoses and all orders for this visit:    Type 2 diabetes mellitus with retinopathy, with long-term current use of insulin, macular edema presence unspecified, unspecified laterality, unspecified retinopathy severity (CMS-HCC)  -     POCT glycosylated hemoglobin (Hb A1C): 7.6--improved from 8.6  -     Diet advice, activity as tolerated.  -     metformin 1000 mg twice daily, insulin 42 units twice daily, jardiance 25 mg  -     Albumin/creatinine urine ratio    Cough/Fatigue, unspecified type  -     fluticasone propionate (FLONASE) 50 mcg/actuation nasal spray; 1 spray into each nostril two (2) times a day.  -     benzonatate (TESSALON) 200 MG capsule; Take 1 capsule (200 mg total) by mouth at bedtime as needed for cough for up to 7 days.  -    No evidence of secondary bacterial infection noted on exam, no indication for oral steroids today.  -     Likely post viral cough versus PND --fluids, steam, nasal saline/steroid spray.  -    ED/return precautions    Impaired vision  -     MRI brain with and without contrast; Future  -     Ambulatory referral to Neurology; Future  -     Carotid duplex dopplers--ordered 3/22  -     ED precautions discussed.    Hypertension, unspecified type          -   25 mg losartan.        -   Noted elevated level in clinic today--patient aware BP goal for patients with diabetes is 130/80        -   Patient educated if ongoing elevation, will need increase in blood pressure medicine        -   DASH diet, activity, healthy choices    Hyperlipidemia, unspecified hyperlipidemia type             -Diet advice, portion control, 20mg  atorvastatin        - LDL : 96 (11/21)    Hidradenitis suppurativa          -Managed by [facility] dermatology--upcoming appointment-11/23/20        -Doing well on Humira, topical clindamycin      Barriers to recommended plan: transportation    Return if symptoms worsen or fail to improve, for As scheduled with PCP.      Subjective:     HPI: Anne Barber is a 60 y.o. female here for Back Pain, Cough (Ongoing cough since July family member was positive for covid las week of July . Patient states she did a at home covid test was negative .), and Fatigue (Has been checking sugar levels at home ranging from 130- 150. ).    Dry cough/fatigue:  Has had 09/28/20, when cough bad/intense feels dizzy. Worse in night--taking tylenol.  Home COVID test negative. Irritation in throat, side of back--with excessive cough  Thinks cough getting better.  No shortness of breath, no chest pains, palpitations, no fever, no chills  Noticed phlegm, mucous in nose. Hx seasonal allergies--not taking antihistamine or nasal spray for this.  No: History of asthma, current lung infections.  Thinks sleep impacted due to dry, cough.  Patient states due to cough, multiple times in the night.  Thinks getting 4-6 hours of sleep.  DM:  Highest level of blood sugar 150  Metformin 1000 mg BID  25mg  losartan   25mg  jaridance  20mg  atorvastatin  42 U insulin BID  Trying to watch diet, regular trips to [facility]      HS/folliculitis:  Using humira 40 mg every week  Topical clindamycin solution for folliculitis  Thinks helping symptoms  Has upcoming dermatology appointment.      Eye problem:  Regular follow-up with [facility]  Patient states has ongoing circumferential right eye pain, occasional sudden loss of vision  Specialist at [facility] I did not think ophthalmologic problem--recommended MRA/MRI of head, bilateral carotid ultrasound and neurology referral    Here with family member  I have reviewed past medical, surgical, medications, allergies, social and family histories today and updated them in Epic where appropriate.    ROS:   Lab Results   Component Value Date    A1C 7.6 (A) 11/03/2020       Review of systems negative unless otherwise noted as per HPI.      Objective:     Vitals:    11/03/20 1006   BP: 140/84   Pulse: 86   Temp: 36.8 ??C (98.2 ??F)   SpO2: 98%     Body mass index is 36.47 kg/m??.    Physical Exam  Vitals and nursing note reviewed.   Constitutional:       General: She is not in acute distress.     Appearance: Normal appearance. She is not ill-appearing, toxic-appearing or diaphoretic.   HENT:      Head: Normocephalic and atraumatic.      Right Ear: Tympanic membrane, ear canal and external ear normal.      Ears:      Comments: Hearing aid present in left ear.     Nose: Congestion and rhinorrhea present.      Comments: Left turbinate-erythematous with clear drainage.     Mouth/Throat:      Mouth: Mucous membranes are moist.      Pharynx: Oropharynx is clear.      Comments: Uvula midline without ulcers, exudate.  Eyes:      Extraocular Movements: Extraocular movements intact.      Conjunctiva/sclera: Conjunctivae normal.      Pupils: Pupils are equal, round, and reactive to light.   Cardiovascular:      Rate and Rhythm: Normal rate and regular rhythm.      Pulses: Normal pulses.      Heart sounds: Murmur heard.   Pulmonary:      Effort: Pulmonary effort is normal. No respiratory distress.      Breath sounds: Normal breath sounds. No stridor. No wheezing, rhonchi or rales.   Chest:      Chest wall: No tenderness.   Musculoskeletal:         General: Normal range of motion.      Cervical back: Normal range of motion and neck supple.   Skin:     Capillary Refill: Capillary refill takes less than 2 seconds.   Neurological:      General: No focal deficit present.      Mental Status: She is alert and oriented to person, place, and time. Mental status is at baseline.   Psychiatric:         Mood and Affect: Mood normal.         Behavior: Behavior normal.         Thought Content: Thought content normal.  Judgment: Judgment normal.            Medication adherence and barriers to the treatment plan have been addressed. Opportunities to optimize healthy behaviors have been discussed. Patient / caregiver voiced understanding.   I personally spent 30 minutes face-to-face and non-face-to-face in the care of this patient, which includes all pre, intra, and post visit time on the date of service.  Cleon Dew, DNP, FNP-C  [facility]  [phone] (F)    Note - This record has been created using [employer]. Chart creation errors have been sought, but may not always have been located. Such creation errors do not reflect on the standard of medical care.

## 2020-11-04 NOTE — Unmapped (Signed)
Urine test: on levels reported on results-no protein in urine.    Please continue healthy diet, activity, insulin, DM medications.    Thanks.  AM

## 2020-11-06 ENCOUNTER — Ambulatory Visit: Admit: 2020-11-06 | Discharge: 2020-11-07 | Payer: PRIVATE HEALTH INSURANCE

## 2020-11-06 NOTE — Unmapped (Signed)
[facility] Refill Coordination Note    Specialty Medication(s) to be Shipped:   Inflammatory Disorders: Humira    Other medication(s) to be shipped: Novolog & Pen Needles     Anne Barber, DOB: 1960-11-12  Phone: [phone] (home)       All above HIPAA information was verified with patient's family member, daughter-[name].     Was a Nurse, learning disability used for this call? No    Completed refill call assessment today to schedule patient's medication shipment from the [facility] [phone]).  All relevant notes have been reviewed.     Specialty medication(s) and dose(s) confirmed: Regimen is correct and unchanged.   Changes to medications: [name] reports no changes at this time.  Changes to insurance: No  New side effects reported not previously addressed with a pharmacist or physician: None reported  Questions for the pharmacist: No    Confirmed patient received a Conservation officer, historic buildings and a Surveyor, mining with first shipment. The patient will receive a drug information handout for each medication shipped and additional FDA Medication Guides as required.       DISEASE/MEDICATION-SPECIFIC INFORMATION        For patients on injectable medications: Patient currently has 1 doses left.  Next injection is scheduled for 11/09/2020.    SPECIALTY MEDICATION ADHERENCE     Medication Adherence    Patient reported X missed doses in the last month: 0  Specialty Medication: Humira CF 40 mg/0.4 ml  Patient is on additional specialty medications: No  Any gaps in refill history greater than 2 weeks in the last 3 months: no  Demonstrates understanding of importance of adherence: yes  Informant: child/children  Reliability of informant: reliable  Confirmed plan for next specialty medication refill: delivery by pharmacy  Refills needed for supportive medications: not needed              Were doses missed due to medication being on hold? No    humira 40/0.4 mg/ml: 7 days of medicine on hand REFERRAL TO PHARMACIST     Referral to the pharmacist: Not needed      [facility]     Shipping address confirmed in Epic.     Delivery Scheduled: Yes, Expected medication delivery date: 11/09/2020.     Medication will be delivered via Same Day Courier to the prescription address in Epic WAM.    [name] D [name]   [facility] Specialty Technician

## 2020-11-09 DIAGNOSIS — E11319 Type 2 diabetes mellitus with unspecified diabetic retinopathy without macular edema: Principal | ICD-10-CM

## 2020-11-09 DIAGNOSIS — I6523 Occlusion and stenosis of bilateral carotid arteries: Principal | ICD-10-CM

## 2020-11-09 DIAGNOSIS — Z794 Long term (current) use of insulin: Principal | ICD-10-CM

## 2020-11-09 MED ORDER — ATORVASTATIN 40 MG TABLET
ORAL_TABLET | Freq: Every day | ORAL | 3 refills | 90.00000 days | Status: CP
Start: 2020-11-09 — End: 2021-11-09
  Filled 2020-11-11: qty 90, 90d supply, fill #0

## 2020-11-09 MED ORDER — ASPIRIN 81 MG TABLET,DELAYED RELEASE
ORAL_TABLET | Freq: Every day | ORAL | 2 refills | 150 days | Status: CP
Start: 2020-11-09 — End: 2021-11-09
  Filled 2020-11-11: qty 150, 150d supply, fill #0

## 2020-11-09 MED FILL — ULTICARE PEN NEEDLE 32 GAUGE X 5/32" (4 MM): SUBCUTANEOUS | 50 days supply | Qty: 100 | Fill #2

## 2020-11-09 MED FILL — NOVOLOG MIX 70-30 FLEXPEN U-100 INSULIN 100 UNIT/ML SUBCUTANEOUS PEN: SUBCUTANEOUS | 33 days supply | Qty: 30 | Fill #3

## 2020-11-09 MED FILL — HUMIRA PEN CITRATE FREE 40 MG/0.4 ML: SUBCUTANEOUS | 28 days supply | Qty: 4 | Fill #3

## 2020-11-09 NOTE — Unmapped (Addendum)
Patient has been called and informed of the following. Patient verbalized understanding I will also be mailing a copy of results.     ----- Message from Deneise Lever, FNP sent at 11/09/2020  8:58 AM EDT -----  Please call patient and inform of following:    1. Small stenosis (less than 40%) of B carotid arteries  2. To prevent complications of this stenosis, will need BP control 130/80 or less, good control of blood sugars & cholesterol.  3. Will need to increase cholesterol medication from 20mg --40mg  and start taking 81mg  aspirin.   4. Prescription for new dose of cholesterol medication has been sent.  Thanks  AM

## 2020-11-09 NOTE — Unmapped (Signed)
Patient is aware 

## 2020-11-09 NOTE — Unmapped (Signed)
Please call patient and inform of following:    1. Small stenosis (less than 40%) of B carotid arteries  2. To prevent complications of this stenosis, will need BP control 130/80 or less, good control of blood sugars & cholesterol.  3. Will need to increase cholesterol medication from 20mg --40mg  and start taking 81mg  aspirin.   4. Prescription for new dose of cholesterol medication has been sent.  Thanks  AM

## 2020-11-19 ENCOUNTER — Ambulatory Visit
Admit: 2020-11-19 | Discharge: 2020-11-20 | Payer: PRIVATE HEALTH INSURANCE | Attending: Student in an Organized Health Care Education/Training Program | Primary: Student in an Organized Health Care Education/Training Program

## 2020-11-19 NOTE — Unmapped (Signed)
Mrs. Wigen is here to follow-up proliferative diabetic retinopathy both eyes    1. PDR, Both Eyes  - Diagnosed ~30 years ago  - Last A1c 7.9  - Treatment: insulin injections    2. Vitreous Hemorrhage OD (resolved)  S/p PPV/EL/partial AFX right eye 06/26/19 Zhang/Deans  S/p PPV/EL/Air left eye  08/10/16 Farber/Zhang   VH resolved by [facility] still CF  No DME both eyes on OCT macula    Will need FA for further evaluation    Plan:  - RTC 2 months Retina CAP V/T/D/mac OCT/FA OD transit    Pt seen with Dr. Beckey Downing    3. History of Neovascular Glaucoma OD, indeterminate stage  - s/p emergent Ahmed Valve 10/12/13, valve functioning with tense overlying bleb  Patient bothered by bleb, will ask Dr. Marcell Anger to take a look at Feb appointment if anything can be done  - IOP elevated OU, states she is no longer on cosopt and has mild pain today  - still on pred forte OD QID  - will need close follow up with Dr. Marcell Anger     *not addressed today*    4. Pseudophakia both eyes   Stable. Monitor.   Seeing DF for glasses/MRx    5. Dry Eyes, OU  -- Likely contributing to intermittent blurry vision, OU  -- c/w ATs QID, Refresh PM qHS, Genteal, and warm compresses   -- today under control.

## 2020-11-20 NOTE — Unmapped (Signed)
Attestation:  I was present with resident during the history and exam.  I discussed the findings, assessment, and plan with the resident and agree with the findings and plan as documented in the resident's note.    Anne Barber

## 2020-11-23 ENCOUNTER — Ambulatory Visit: Admit: 2020-11-23 | Discharge: 2020-11-24 | Payer: PRIVATE HEALTH INSURANCE

## 2020-11-23 DIAGNOSIS — L304 Erythema intertrigo: Principal | ICD-10-CM

## 2020-11-23 DIAGNOSIS — R1031 Right lower quadrant pain: Principal | ICD-10-CM

## 2020-11-23 DIAGNOSIS — L732 Hidradenitis suppurativa: Principal | ICD-10-CM

## 2020-11-23 DIAGNOSIS — R1032 Left lower quadrant pain: Principal | ICD-10-CM

## 2020-11-23 MED ORDER — NYSTATIN 100,000 UNIT/GRAM TOPICAL OINTMENT
Freq: Two times a day (BID) | TOPICAL | 1 refills | 0.00000 days | Status: CP
Start: 2020-11-23 — End: ?

## 2020-11-23 NOTE — Unmapped (Signed)
It was nice to see you today! Your resident physician was Dr. Taisia Fantini    If any of your medications are too expensive, look for a coupon at GoodRx.com or reference the application on a smartphone  - Enter the medication name, size, and your zip code to find coupons for local pharmacies.   - Print a coupon and bring it to the pharmacy, or pull up the coupon on a smartphone.  - You can also call pharmacies to ask about the cost of your medication before you pick it up.  If you still cannot afford your medication, please let us know.     Please call our clinic at [phone] with any concerns. We look forward to seeing you again!    [facility] releases most results to you as soon as they are available. Therefore, you may see some results before we do. Please give us 2 business days to review the tests and contact you by phone or through MyChart. If you are concerned that some results may be upsetting or confusing, you may wish to wait until we contact you before looking at the report in MyChart.  If you have an urgent question, you can send us a message or call our clinic. Otherwise, we prefer that you wait 2 business days for us to contact you.
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
## 2020-12-04 NOTE — Unmapped (Signed)
Anne Barber's Humira therapy is stable - she recently had a visit with clinic.     I'm requesting clindamycin wipes for her to use, and I relayed to her daughter that Nystatin order went to [facility].       [facility] Clinical Assessment & Refill Coordination Note    Anne Barber, DOB: [birth year]  Phone: [phone] (home)     All above HIPAA information was verified with patient's family member, daughter and patient.     Was a Nurse, learning disability used for this call? no, daughter declined    Specialty Medication(s):   Inflammatory Disorders: Humira     Current Outpatient Medications   Medication Sig Dispense Refill   ??? alcohol swabs PadM Test daily before all meals/snacks and once before bedtime. 1 each 0   ??? aspirin (ECOTRIN) 81 MG tablet Take 1 tablet (81 mg total) by mouth daily. 150 tablet 2   ??? atorvastatin (LIPITOR) 40 MG tablet Take 1 tablet (40 mg total) by mouth daily. 90 tablet 3   ??? BD INSULIN SYRINGE ULT-FINE II 1 mL 31 gauge x 5/16 Syrg 1 Package by Miscellaneous route Two (2) times a day. 100 each 3   ??? blood sugar diagnostic (ONETOUCH ULTRA BLUE TEST STRIP) Strp USE TO CHECK BLOOD SUGAR UP TO 10 TIMES DAILY AS NEEDED 600 strip 1   ??? blood-glucose meter kit Use as instructed 1 each 0   ??? clindamycin (CLEOCIN T) 1 % lotion Apply topically every morning. 60 mL 5   ??? dexamethasone (DECADRON) 0.1 % ophthalmic solution Administer 4 drops into the left ear Two (2) times a day for 30 days 5 mL 0   ??? dorzolamide-timoloL (COSOPT) 22.3-6.8 mg/mL ophthalmic solution Administer 1 drop to both eyes Two (2) times a day. 10 mL 12   ??? empagliflozin (JARDIANCE) 25 mg tablet Take 1 tablet (25 mg total) by mouth daily. 90 tablet 3   ??? empty container Misc Use as directed to dispose of Humira pens. 1 each 2   ??? fluticasone propionate (FLONASE) 50 mcg/actuation nasal spray 1 spray into each nostril two (2) times a day. 16 g 0   ??? folic acid/multivit-min/lutein (CENTRUM SILVER ORAL) Take 1 tablet by mouth daily at 0600.      ??? HUMIRA PEN CITRATE FREE 40 MG/0.4 ML Inject 0.4 mL (40 mg total) under the skin every seven (7) days. 4 each 11   ??? HUMIRA PEN CITRATE FREE STARTER PACK FOR CROHN'S/UC/HS 3 X 80 MG/0.8 ML Inject the contents of 2 pens (160 mg) under the skin on day 1, THEN 1 pen (80 mg) on day 15. 3 each 0   ??? insulin aspart protamine-insulin aspart (NOVOLOG 70/30) 100 unit/mL (70-30) injection pen Inject 0.45 mL (45 Units total) under the skin two (2) times a day. 30 mL 11   ??? lancing device Misc USE 4 TIMES DAILY BEFORE MEALS AND NIGHTLY ( USAR CUATRO VECES AL DIA SPBM Y TODAS LAS NOCHES ) 1 each 1   ??? losartan (COZAAR) 25 MG tablet Take 1 tablet (25 mg total) by mouth daily. 90 tablet 3   ??? metFORMIN (GLUCOPHAGE) 1000 MG tablet Take 1 tablet (1,000 mg total) by mouth 2 (two) times a day with meals. 180 tablet 3   ??? nystatin (MYCOSTATIN) 100,000 unit/gram ointment Apply topically Two (2) times a day. To skin folds in the groin 90 g 1   ??? omeprazole (PRILOSEC) 20 MG capsule Take 1 capsule (20 mg  total) by mouth daily. 90 capsule 1   ??? pen needle, diabetic (ULTICARE PEN NEEDLE) 32 gauge x 5/32 (4 mm) Ndle Use as directed two (2) times a day 100 each 12     No current facility-administered medications for this visit.     Facility-Administered Medications Ordered in Other Visits   Medication Dose Route Frequency Provider Last Rate Last Admin   ??? matrix hemostatic sealant (FLOSEAL) topical            ??? matrix hemostatic sealant (FLOSEAL) topical                 Changes to medications: [name] reports no changes at this time.    No Known Allergies    Changes to allergies: No    SPECIALTY MEDICATION ADHERENCE     Humira - 1 left  Medication Adherence    Patient reported X missed doses in the last month: 0  Specialty Medication: Humira          Specialty medication(s) dose(s) confirmed: Regimen is correct and unchanged.     Are there any concerns with adherence? No    Adherence counseling provided? Not needed    CLINICAL MANAGEMENT AND INTERVENTION      Clinical Benefit Assessment:    Do you feel the medicine is effective or helping your condition? Yes    Clinical Benefit counseling provided? Not needed    Adverse Effects Assessment:    Are you experiencing any side effects? No    Are you experiencing difficulty administering your medicine? No    Quality of Life Assessment:    Quality of Life    Rheumatology  Oncology  Dermatology  1. What impact has your specialty medication had on the symptoms of your skin condition (i.e. itchiness, soreness, stinging)?: Some  2. What impact has your specialty medication had on your comfort level with your skin?: Some  Cystic Fibrosis          Have you discussed this with your provider? Not needed    Acute Infection Status:    Acute infections noted within Epic:  No active infections  Patient reported infection: None    Therapy Appropriateness:    Is therapy appropriate? Yes, therapy is appropriate and should be continued    DISEASE/MEDICATION-SPECIFIC INFORMATION      For patients on injectable medications: Patient currently has 1 doses left.  Next injection is scheduled for Monday, 9/12.    PATIENT SPECIFIC NEEDS     - Does the patient have any physical, cognitive, or cultural barriers? No    - Is the patient high risk? No    - Does the patient require a Care Management Plan? No     - Does the patient require physician intervention or other additional services (i.e. nutrition, smoking cessation, social work)? No      SHIPPING     Specialty Medication(s) to be Shipped:   Inflammatory Disorders: Humira    Other medication(s) to be shipped: losartan, Jardiance, sharps kit, pen needles, clinda wqipes (Requested)     Changes to insurance: No    Delivery Scheduled: Yes, Expected medication delivery date: Wed, 9/14.     Medication will be delivered via Same Day Courier to the confirmed prescription address in [facility].    The patient will receive a drug information handout for each medication shipped and additional FDA Medication Guides as required.  Verified that patient has previously received a Conservation officer, historic buildings and a Surveyor, mining.  The patient or caregiver noted above participated in the development of this care plan and knows that they can request review of or adjustments to the care plan at any time.      All of the patient's questions and concerns have been addressed.    Lanney Gins   [facility] Specialty Pharmacist

## 2020-12-09 DIAGNOSIS — L732 Hidradenitis suppurativa: Principal | ICD-10-CM

## 2020-12-09 MED ORDER — CLINDAMYCIN PHOSPHATE 1 % TOPICAL SWAB
Freq: Two times a day (BID) | TOPICAL | 11 refills | 0.00000 days | Status: CP
Start: 2020-12-09 — End: ?
  Filled 2020-12-17: qty 60, 30d supply, fill #0

## 2020-12-09 MED FILL — EMPTY CONTAINER: 120 days supply | Qty: 1 | Fill #1

## 2020-12-09 MED FILL — JARDIANCE 25 MG TABLET: ORAL | 90 days supply | Qty: 90 | Fill #2

## 2020-12-09 MED FILL — LOSARTAN 25 MG TABLET: ORAL | 90 days supply | Qty: 90 | Fill #2

## 2020-12-09 MED FILL — HUMIRA PEN CITRATE FREE 40 MG/0.4 ML: SUBCUTANEOUS | 28 days supply | Qty: 4 | Fill #4

## 2020-12-09 NOTE — Unmapped (Signed)
Clinda swabs sent as requested.

## 2020-12-17 MED FILL — ULTICARE PEN NEEDLE 32 GAUGE X 5/32" (4 MM): SUBCUTANEOUS | 50 days supply | Qty: 100 | Fill #3

## 2020-12-30 MED ORDER — METFORMIN 1,000 MG TABLET
ORAL_TABLET | Freq: Two times a day (BID) | ORAL | 3 refills | 90 days | Status: CP
Start: 2020-12-30 — End: 2021-12-30
  Filled 2021-01-06: qty 180, 90d supply, fill #0

## 2020-12-30 NOTE — Unmapped (Signed)
[facility] Refill Coordination Note    Specialty Medication(s) to be Shipped:   Inflammatory Disorders: Humira    Other medication(s) to be shipped: Metformin and Omeprazole     Anne Barber, DOB: [birth year]  Phone: [phone] (home)       All above HIPAA information was verified with patient's family member, daughter-Anne Barber.     Was a Nurse, learning disability used for this call? No    Completed refill call assessment today to schedule patient's medication shipment from the [facility] [phone]).  All relevant notes have been reviewed.     Specialty medication(s) and dose(s) confirmed: Regimen is correct and unchanged.   Changes to medications: Anne Barber reports no changes at this time.  Changes to insurance: No  New side effects reported not previously addressed with a pharmacist or physician: None reported  Questions for the pharmacist: No    Confirmed patient received a Conservation officer, historic buildings and a Surveyor, mining with first shipment. The patient will receive a drug information handout for each medication shipped and additional FDA Medication Guides as required.       DISEASE/MEDICATION-SPECIFIC INFORMATION        For patients on injectable medications: Patient currently has 1 doses left.  Next injection is scheduled for 01/04/2021.    SPECIALTY MEDICATION ADHERENCE     Medication Adherence    Patient reported X missed doses in the last month: 0  Specialty Medication: Humira  Patient is on additional specialty medications: No  Any gaps in refill history greater than 2 weeks in the last 3 months: no  Demonstrates understanding of importance of adherence: yes  Informant: child/children  Reliability of informant: reliable  Confirmed plan for next specialty medication refill: delivery by pharmacy  Refills needed for supportive medications: yes, ordered or provider notified              Were doses missed due to medication being on hold? No    Humira 40/0.4 mg/ml: 7 days of medicine on hand         REFERRAL TO PHARMACIST     Referral to the pharmacist: Not needed      [facility]     Shipping address confirmed in Epic.     Delivery Scheduled: Yes, Expected medication delivery date: 01/06/2021.     Medication will be delivered via Same Day Courier to the prescription address in Epic W[facility].    Anne Barber Anne Barber   [facility] Specialty Technician

## 2020-12-30 NOTE — Unmapped (Signed)
Patient is requesting the following refill  Requested Prescriptions     Pending Prescriptions Disp Refills   ??? metFORMIN (GLUCOPHAGE) 1000 MG tablet 180 tablet 3     Sig: Take 1 tablet (1,000 mg total) by mouth 2 (two) times a day with meals.       Recent Visits  Date Type Provider Dept   11/03/20 Office Visit Deneise Lever, FNP [facility]   06/11/20 Office Visit Deneise Lever, FNP [place] [facility]   01/20/20 Office Visit Melissa Rolla Flatten, MD [address] [facility]   Showing recent visits within past 365 days with a meds authorizing provider and meeting all other requirements  Future Appointments  Date Type Provider Dept   01/29/21 Appointment Deneise Lever, FNP [place] [facility]   Showing future appointments within next 365 days with a meds authorizing provider and meeting all other requirements       Labs:   A1c:   HGB A1C, POC (%)   Date Value   11/16/2016 11.3 (H)     Hemoglobin A1C (%)   Date Value   11/03/2020 7.6 (A)

## 2021-01-06 MED FILL — HUMIRA PEN CITRATE FREE 40 MG/0.4 ML: SUBCUTANEOUS | 28 days supply | Qty: 4 | Fill #5

## 2021-01-06 MED FILL — OMEPRAZOLE 20 MG CAPSULE,DELAYED RELEASE: ORAL | 90 days supply | Qty: 90 | Fill #1

## 2021-01-28 DIAGNOSIS — R059 Cough: Principal | ICD-10-CM

## 2021-01-28 MED ORDER — ALCOHOL SWABS
1 refills | 0 days | Status: CP
Start: 2021-01-28 — End: 2022-01-28
  Filled 2021-02-02: qty 400, 67d supply, fill #0

## 2021-01-28 MED ORDER — FLUTICASONE PROPIONATE 50 MCG/ACTUATION NASAL SPRAY,SUSPENSION
Freq: Two times a day (BID) | NASAL | 1 refills | 0 days | Status: CP
Start: 2021-01-28 — End: 2022-01-28
  Filled 2021-02-02: qty 16, 30d supply, fill #0

## 2021-01-28 NOTE — Unmapped (Signed)
[facility] Refill Coordination Note    Specialty Medication(s) to be Shipped:   Inflammatory Disorders: Humira    Other medication(s) to be shipped: flonase, alcohol swabs, novolog, pen needles and clindamycin swabs     Anne Barber, DOB: 07-25-1960  Phone: [phone] (home)       All above HIPAA information was verified with patient's caregiver, Anne Barber     Was a translator used for this call? No    Completed refill call assessment today to schedule patient's medication shipment from the [facility] [phone]).  All relevant notes have been reviewed.     Specialty medication(s) and dose(s) confirmed: Regimen is correct and unchanged.   Changes to medications: Anne Barber reports no changes at this time.  Changes to insurance: No  New side effects reported not previously addressed with a pharmacist or physician: None reported  Questions for the pharmacist: No    Confirmed patient received a Conservation officer, historic buildings and a Surveyor, mining with first shipment. The patient will receive a drug information handout for each medication shipped and additional FDA Medication Guides as required.       DISEASE/MEDICATION-SPECIFIC INFORMATION        For patients on injectable medications: Patient currently has 1 doses left.  Next injection is scheduled for 02/01/2021.    SPECIALTY MEDICATION ADHERENCE     Medication Adherence    Patient reported X missed doses in the last month: 0  Specialty Medication: Humira 40mg /0.88ml  Patient is on additional specialty medications: No  Patient is on more than two specialty medications: No        Were doses missed due to medication being on hold? No    REFERRAL TO PHARMACIST     Referral to the pharmacist: Not needed      [facility]     Shipping address confirmed in Epic.     Delivery Scheduled: Yes, Expected medication delivery date: 02/02/2021.     Medication will be delivered via Same Day Courier to the prescription address in Epic WAM.    Anne Barber [facility] Specialty Technician

## 2021-01-29 ENCOUNTER — Ambulatory Visit: Admit: 2021-01-29 | Discharge: 2021-01-30 | Payer: PRIVATE HEALTH INSURANCE

## 2021-01-29 DIAGNOSIS — E11319 Type 2 diabetes mellitus with unspecified diabetic retinopathy without macular edema: Principal | ICD-10-CM

## 2021-01-29 DIAGNOSIS — I10 Essential (primary) hypertension: Principal | ICD-10-CM

## 2021-01-29 DIAGNOSIS — G4700 Insomnia, unspecified: Principal | ICD-10-CM

## 2021-01-29 DIAGNOSIS — H4051X2 Glaucoma secondary to other eye disorders, right eye, moderate stage: Principal | ICD-10-CM

## 2021-01-29 DIAGNOSIS — K219 Gastro-esophageal reflux disease without esophagitis: Principal | ICD-10-CM

## 2021-01-29 DIAGNOSIS — E785 Hyperlipidemia, unspecified: Principal | ICD-10-CM

## 2021-01-29 DIAGNOSIS — Z794 Long term (current) use of insulin: Principal | ICD-10-CM

## 2021-01-29 DIAGNOSIS — E113513 Type 2 diabetes mellitus with proliferative diabetic retinopathy with macular edema, bilateral: Principal | ICD-10-CM

## 2021-01-29 DIAGNOSIS — D649 Anemia, unspecified: Principal | ICD-10-CM

## 2021-01-29 DIAGNOSIS — F418 Other specified anxiety disorders: Principal | ICD-10-CM

## 2021-01-29 MED ORDER — OMEPRAZOLE 20 MG CAPSULE,DELAYED RELEASE
ORAL_CAPSULE | Freq: Every day | ORAL | 3 refills | 90 days | Status: CP
Start: 2021-01-29 — End: 2022-01-29
  Filled 2021-03-26: qty 90, 90d supply, fill #0

## 2021-01-29 MED ORDER — TRAZODONE 50 MG TABLET
ORAL_TABLET | Freq: Every evening | ORAL | 3 refills | 90 days | Status: CP
Start: 2021-01-29 — End: 2021-03-30
  Filled 2021-02-02: qty 90, 90d supply, fill #0

## 2021-01-29 NOTE — Unmapped (Signed)
Assessment and Plan:     Anne Barber was seen today for annual exam.    Diagnoses and all orders for this visit:    Type 2 diabetes mellitus with retinopathy, with long-term current use of insulin, macular edema presence unspecified, unspecified laterality, unspecified retinopathy severity (CMS-HCC)  Hyperlipidemia, unspecified hyperlipidemia type  Hypertension, unspecified type           -Continue novolog 42 U BID, 1000 mg metformin, 25 mg jardiance, 25 mg losartan, 40 mg atorvastatin       - Diet advice, portion control, activity as able    Anemia, unspecified type  -     CBC, last hemoglobin: 12.5 (10/21)  -     Colonoscopy in 2014--normal, can repeat in 2024    Gastroesophageal reflux disease, unspecified whether esophagitis present  -     omeprazole (PRILOSEC) 20 MG capsule; Take 1 capsule (20 mg total) by mouth daily.  -     Diet advice, healthy choices    Insomnia, unspecified type  -     traZODone (DESYREL) 50 MG tablet; Take 1 tablet (50 mg total) by mouth nightly.  -     Sleep hygiene, stress management, feels trazodone helpful    Proliferative diabetic retinopathy of both eyes with macular edema associated with type 2 diabetes mellitus (CMS-HCC)  Neovascular glaucoma of right eye, moderate stage           -   Complaint with Cosopt drops, next eye appointment 2/23         -    Continue pain around eyes, headaches in occipital area of head--has neurology appointment 02/12/21    Depression with anxiety           -   Noted PHQ/GAD scores today, pt declined medication, referral for therapy         -   Advised can return/call if desires additional help    Other orders  -     INFLUENZA INJ MDCK PF, QUAD (FLUCELVAX)(45MO AND UP EGG FREE)  -     Needs COVID booster        Barriers to recommended plan: transportation    Return if symptoms worsen or fail to improve, for 4 meses , DM.      Subjective:     HPI: Anne Barber is a 60 y.o. female here for Annual Exam.    DM:  Novlog 42 Units BID  Lowest blood sugar 70s, --x2 in the night  Metformin 1000 BID, 25 mg jardiance    Cholesterol:Taking 40 mg atorvastatin    HTN: 25 mg losartan, 81 mg ASA    GERD: Omeprazole 20 mg    Allergy: taking flonase, needs refill    Mood: Feels mood is low due to inability to see/hear properly  Low mood for past 1.5 months--thinks due to chronic health conditions  When feels low, pt can talk to daughter--feels better.  Feels she is already taking too many medications, declined rx for depression, declined referral for therapy.  Aware can call/return if desires medication or therapy.  No SI, no HI.    HS: Stable on weekly Humira, clindamycin swabs  Glaucoma: seen regularly by [facility], complaint with eye drops  Has neurology appointment 02/12/21, MRI of head scheduled on 02/26/21--concerned about cost as insurance would not cover  Advised to discuss needed tests w/neurologist prior to proceeding with MRI    Prevention: UTD with mammogram, colonoscopy, pap  Needs flu shot  Needs COVID  booster    Here with daughter  I have reviewed past medical, surgical, medications, allergies, social and family histories today and updated them in Epic where appropriate.    ROS:   PHQ-9 PHQ-9 TOTAL SCORE   01/29/2021 9   01/20/2020 7   11/05/2019 11     GAD7 Total Score GAD-7 Total Score   01/29/2021 3   01/20/2020 7       Review of systems negative unless otherwise noted as per HPI.      Objective:     Vitals:    01/29/21 1319   BP: 132/72   Pulse: 90   Temp: 36.6 ??C (97.9 ??F)   SpO2: 98%     Body mass index is 36.98 kg/m??.    Physical Exam  Vitals and nursing note reviewed.   Constitutional:       Appearance: Normal appearance.   Cardiovascular:      Rate and Rhythm: Normal rate and regular rhythm.      Pulses: Normal pulses.      Heart sounds: Normal heart sounds.   Pulmonary:      Effort: Pulmonary effort is normal.      Breath sounds: Normal breath sounds.   Musculoskeletal:         General: Normal range of motion.      Cervical back: Normal range of motion and neck supple.   Skin:     General: Skin is warm.      Capillary Refill: Capillary refill takes less than 2 seconds.   Neurological:      General: No focal deficit present.      Mental Status: She is alert and oriented to person, place, and time. Mental status is at baseline.   Psychiatric:         Mood and Affect: Mood normal.         Behavior: Behavior normal.         Thought Content: Thought content normal.         Judgment: Judgment normal.            Medication adherence and barriers to the treatment plan have been addressed. Opportunities to optimize healthy behaviors have been discussed. Patient / caregiver voiced understanding.   I personally spent 30 minutes face-to-face and non-face-to-face in the care of this patient, which includes all pre, intra, and post visit time on the date of service.  Cleon Dew, DNP, FNP-C  [facility]  [phone] (F)    Note - This record has been created using [employer]. Chart creation errors have been sought, but may not always have been located. Such creation errors do not reflect on the standard of medical care.

## 2021-01-30 LAB — COMPREHENSIVE METABOLIC PANEL
ALBUMIN: 3.9 g/dL (ref 3.4–5.0)
ALKALINE PHOSPHATASE: 104 U/L (ref 46–116)
ALT (SGPT): 16 U/L (ref 10–49)
ANION GAP: 9 mmol/L (ref 5–14)
AST (SGOT): 22 U/L (ref <=34)
BILIRUBIN TOTAL: 0.3 mg/dL (ref 0.3–1.2)
BLOOD UREA NITROGEN: 15 mg/dL (ref 9–23)
BUN / CREAT RATIO: 21
CALCIUM: 9.4 mg/dL (ref 8.7–10.4)
CHLORIDE: 101 mmol/L (ref 98–107)
CO2: 26 mmol/L (ref 20.0–31.0)
CREATININE: 0.7 mg/dL
EGFR CKD-EPI (2021) FEMALE: >90 mL/min/{1.73_m2} (ref >=60)
GLUCOSE RANDOM: 344 mg/dL — ABNORMAL HIGH (ref 70–179)
POTASSIUM: 4.2 mmol/L (ref 3.4–4.8)
PROTEIN TOTAL: 7.8 g/dL (ref 5.7–8.2)
SODIUM: 136 mmol/L (ref 135–145)

## 2021-01-30 LAB — LIPID PANEL
CHOLESTEROL/HDL RATIO SCREEN: 3.8 (ref 1.0–4.5)
CHOLESTEROL: 151 mg/dL (ref <=200)
HDL CHOLESTEROL: 40 mg/dL (ref 40–60)
LDL CHOLESTEROL CALCULATED: 66 mg/dL (ref 40–99)
NON-HDL CHOLESTEROL: 111 mg/dL (ref 70–130)
TRIGLYCERIDES: 226 mg/dL — ABNORMAL HIGH (ref 0–150)
VLDL CHOLESTEROL CAL: 45.2 mg/dL — ABNORMAL HIGH (ref 11–41)

## 2021-01-30 LAB — CBC
HEMATOCRIT: 37.2 % (ref 34.0–44.0)
HEMOGLOBIN: 12.1 g/dL (ref 11.3–14.9)
MEAN CORPUSCULAR HEMOGLOBIN CONC: 32.6 g/dL (ref 32.0–36.0)
MEAN CORPUSCULAR HEMOGLOBIN: 26.9 pg (ref 25.9–32.4)
MEAN CORPUSCULAR VOLUME: 82.6 fL (ref 77.6–95.7)
MEAN PLATELET VOLUME: 11.9 fL — ABNORMAL HIGH (ref 6.8–10.7)
PLATELET COUNT: 187 10*9/L (ref 150–450)
RED BLOOD CELL COUNT: 4.51 10*12/L (ref 3.95–5.13)
RED CELL DISTRIBUTION WIDTH: 14.9 % (ref 12.2–15.2)
WBC ADJUSTED: 6.8 10*9/L (ref 3.6–11.2)

## 2021-01-30 LAB — TSH: THYROID STIMULATING HORMONE: 0.777 u[IU]/mL (ref 0.550–4.780)

## 2021-01-30 NOTE — Unmapped (Signed)
I do not think pt uses MyChart:    Cholesterol--bad cholesterol--healthy, please continue medication, healthy diet.  CBC, thyroid, kidney/liver: normal.    Thanks,  AM

## 2021-02-01 NOTE — Unmapped (Signed)
I have called patients daughter Simona Huh and reviewed all lab results with the, answering all questions .Daughter voiced understanding and acceptance of this advice and will call back if further questions or concerns arise.

## 2021-02-02 MED FILL — ULTICARE PEN NEEDLE 32 GAUGE X 5/32" (4 MM): SUBCUTANEOUS | 50 days supply | Qty: 100 | Fill #4

## 2021-02-02 MED FILL — NOVOLOG MIX 70-30 FLEXPEN U-100 INSULIN 100 UNIT/ML SUBCUTANEOUS PEN: SUBCUTANEOUS | 33 days supply | Qty: 30 | Fill #4

## 2021-02-02 MED FILL — CLINDAMYCIN PHOSPHATE 1 % TOPICAL SWAB: TOPICAL | 30 days supply | Qty: 60 | Fill #1

## 2021-02-02 MED FILL — HUMIRA PEN CITRATE FREE 40 MG/0.4 ML: SUBCUTANEOUS | 28 days supply | Qty: 4 | Fill #6

## 2021-02-09 LAB — RHEUMATOID FACTOR, QUANT: RHEUMATOID FACTOR: 4.9 [IU]/mL (ref <14.0)

## 2021-02-12 ENCOUNTER — Ambulatory Visit
Admit: 2021-02-12 | Discharge: 2021-02-12 | Payer: PRIVATE HEALTH INSURANCE | Attending: Student in an Organized Health Care Education/Training Program | Primary: Student in an Organized Health Care Education/Training Program

## 2021-02-12 ENCOUNTER — Ambulatory Visit: Admit: 2021-02-12 | Discharge: 2021-02-12 | Payer: PRIVATE HEALTH INSURANCE

## 2021-02-12 DIAGNOSIS — M5481 Occipital neuralgia: Principal | ICD-10-CM

## 2021-02-12 DIAGNOSIS — H547 Unspecified visual loss: Principal | ICD-10-CM

## 2021-02-12 DIAGNOSIS — R4189 Other symptoms and signs involving cognitive functions and awareness: Principal | ICD-10-CM

## 2021-02-12 DIAGNOSIS — G43101 Migraine with aura, not intractable, with status migrainosus: Principal | ICD-10-CM

## 2021-02-12 DIAGNOSIS — Z794 Long term (current) use of insulin: Principal | ICD-10-CM

## 2021-02-12 DIAGNOSIS — F0390 Unspecified dementia without behavioral disturbance: Principal | ICD-10-CM

## 2021-02-12 DIAGNOSIS — E11319 Type 2 diabetes mellitus with unspecified diabetic retinopathy without macular edema: Principal | ICD-10-CM

## 2021-02-12 LAB — T4, FREE: FREE T4: 0.9 ng/dL (ref 0.89–1.76)

## 2021-02-12 LAB — HEMOGLOBIN A1C
ESTIMATED AVERAGE GLUCOSE: 180 mg/dL
HEMOGLOBIN A1C: 7.9 % — ABNORMAL HIGH (ref 4.8–5.6)

## 2021-02-12 LAB — VITAMIN B12: VITAMIN B-12: 1432 pg/mL — ABNORMAL HIGH (ref 211–911)

## 2021-02-12 LAB — FOLATE: FOLATE: >24 ng/mL (ref >=5.4)

## 2021-02-12 MED ORDER — NORTRIPTYLINE 10 MG CAPSULE
ORAL_CAPSULE | Freq: Every evening | ORAL | 2 refills | 30.00000 days | Status: CP
Start: 2021-02-12 — End: 2021-05-13
  Filled 2021-02-17: qty 30, 30d supply, fill #0

## 2021-02-12 NOTE — Unmapped (Signed)
Outpatient Neurology Consult Note     [facility]  [address]  [address]  [place] [address]    Date: 02/12/2021  Patient Name: Anne Barber  MRN: [id number]  PCP: Jeannette How*     Assessment and Plan        Anne Barber is a 60 y.o. female presenting in consultation for evaluation of ***.    Return Visit in: ***    I personally spent *** minutes face-to-face and non-face-to-face in the care of this patient, which includes all pre, intra, and post visit time on the date of service.           {Neurology Clinic Staffing2022:88519}    Pearline Cables, MD  Neurology  PG-2         HPI         HPI: Anne Barber is a 60 y.o. 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 Johnn Hai for evaluation of ***.      Anisocoria - R>L, 4>2    BP 183/72 -> repeat please***  Does not know reason for visit  To check the back of the neck  - PCP said due to BP  - sharp pain, 6-7/10, aspirin 2x, in AM/PM, baby aspirin, TTP, does not shoot to the front, cold? No, started 2 years, comes and goes, triggers random, most recent one started 5 months ago  -> not eperiernced prior to 2 years ago  -> no hx of chronic HA  -> fam hx: oldest daughter has migraines  -> no vomit, no photophobia/phono  -> humira  8 years menopause      Decreasing sight, hx of glaucoma  Pain in forehead & nose (eyebrow and under eye?)  - R side sinuses, teary eyes  - every time a blood vessel about to rupture (4 ish)  - 3 months ago  - present when saw ophtho in august? Yes, scheduled her for surgery but then vision improved, so canceled, 2 weeks ago had another ****,     Eye doctor -> intermt clears up in 2 unlikely due to glaucoma, confirmed with migraine aura  Wakes up from pain with whooshing (which is consatnt but worse with headache)    Memory issues  - snores   - sleep apnea?  - losts   - started 4 months ago, with increase in last 2 months  - angry   - pulled to a side to the right    Tinglnig, numbness, cold    PCP visit on 11/4:  Glaucoma: [facility] by [facility], complaint with eye drops  Has neurology appointment 02/12/21, MRI of head scheduled on 02/26/21--concerned about cost as insurance would not cover  Advised to discuss needed tests w/neurologist prior to proceeding with MRI    Ophtho visit on 8/25:  Anne Barber is here to follow-up proliferative diabetic retinopathy both eyes  ??  1. PDR, Both Eyes  - Diagnosed ~30 years ago  - Last A1c 7.9  - Treatment: insulin injections  ??  2. Vitreous Hemorrhage OD (resolved)  S/p PPV/EL/partial AFX right eye 06/26/19 Zhang/Deans  S/p PPV/EL/Air left eye  08/10/16 Farber/Zhang   VH resolved by [facility] still CF  No DME both eyes on OCT macula  Will need FA for further evaluation  ??  Plan:  - RTC 2 months Retina CAP V/T/D/mac OCT/FA OD transit  ??  Pt seen with Dr. Beckey Downing  ??  3. History of Neovascular Glaucoma OD, indeterminate stage  - s/p emergent Ahmed Valve 10/12/13, valve functioning with tense overlying bleb  Patient bothered by bleb, will ask Dr. Marcell Anger to take a look at Feb appointment if anything can be done  - IOP elevated OU, states she is no longer on cosopt and has mild pain today  - still on pred forte OD QID  - will need close follow up with Dr. Marcell Anger   ??  *not addressed today*  ??  4. Pseudophakia both eyes   Stable. Monitor.   Seeing DF for glasses/MRx  ??  5. Dry Eyes, OU  -- Likely contributing to intermittent blurry vision, OU  -- c/w ATs QID, Refresh PM qHS, Genteal, and warm compresses   -- today under control.   ??  ----- Message from Deneise Lever, FNP sent at 11/09/2020  8:58 AM EDT -----  Please call patient and inform of following:  ??  1. Small stenosis (less than 40%) of B carotid arteries  2. To prevent complications of this stenosis, will need BP control 130/80 or less, good control of blood sugars & cholesterol.  3. Will need to increase cholesterol medication from 20mg --40mg  and start taking 81mg  aspirin.   4. Prescription for new dose of cholesterol medication has been sent.  Thanks  AM    Ophtho 09/25/20:  Bilateral blackout of vision - has had few occurrences recently   - one with dizziness  - lasted for a few seconds   - this does not sound ophthalmologic. Concerned for TIA/neuro-vascular   - d/w pcp  ***.    PVL carotids 8/12:  RIGHT  CCA prox   No plaque visualized.  CCA mid    No plaque visualized.  CCA dist   Velocities are characteristic of a non-hemodynamically significant             stenosis. Plaque appears focal, heterogenous and smooth.  ICA prox   10-20% narrowing by image. Plaque at proximal appears focal,             heterogenous and irregular.  ICA mid    No plaque visualized.  ICA dist   No abnormality visualized.  ECA        Plaque noted. Plaque appears focal, heterogenous and irregular.  Vertebral  Antegrade  Subclavian Multiphasic, WNL    LEFT  CCA prox   No plaque visualized.  CCA mid    No plaque visualized.  CCA dist   No plaque visualized.  ICA prox   20-30% narrowing by image. Plaque at proximal appears heterogenous,             irregular and focal.  ICA mid    No plaque visualized.  ICA dist   No abnormality visualized.  ECA        Plaque noted. Plaque appears focal, heterogenous and irregular.  Vertebral  Antegrade  Subclavian Multiphasic, WNL    MRI brain CN 8 10/30/2019:  CLINICAL INDICATION: 60 years old Female with hearing loss, tinnitus ??- H90.3 - Sensorineural hearing loss (SNHL) of both ears - H93.19 - Tinnitus, unspecified laterality ??      No change from 2008. No acute infarct. No mention of old.       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 DO NOT LEAVE AS NOT ON FILE***}    No Known Allergies     Current Outpatient Medications   Medication Sig Dispense Refill   ??? alcohol swabs PadM Test daily before all meals/snacks and once before bedtime. 400 each 1   ??? aspirin (ECOTRIN) 81 MG tablet Take 1 tablet (81 mg total) by mouth daily. 150 tablet 2   ??? atorvastatin (LIPITOR) 40 MG tablet Take 1 tablet (40 mg total) by mouth daily. 90 tablet 3   ??? BD INSULIN SYRINGE ULT-FINE II 1 mL 31 gauge x 5/16 Syrg 1 Package by Miscellaneous route Two (2) times a day. 100 each 3   ??? blood sugar diagnostic (ONETOUCH ULTRA BLUE TEST STRIP) Strp USE TO CHECK BLOOD SUGAR UP TO 10 TIMES DAILY AS NEEDED 600 strip 1   ??? blood-glucose meter kit Use as instructed 1 each 0   ??? clindamycin (CLEOCIN T) 1 % lotion Apply topically every morning. 60 mL 5   ??? clindamycin (CLEOCIN T) 1 % Swab Apply 1 application topically Two (2) times a day. 60 each 11   ??? dexamethasone (DECADRON) 0.1 % ophthalmic solution Administer 4 drops into the left ear Two (2) times a day for 30 days 5 mL 0   ??? dorzolamide-timoloL (COSOPT) 22.3-6.8 mg/mL ophthalmic solution Administer 1 drop to both eyes Two (2) times a day. 10 mL 12   ??? empagliflozin (JARDIANCE) 25 mg tablet Take 1 tablet (25 mg total) by mouth daily. 90 tablet 3   ??? empty container Misc Use as directed to dispose of Humira pens. 1 each 2   ??? fluticasone propionate (FLONASE) 50 mcg/actuation nasal spray 1 spray into each nostril two (2) times a day. 48 g 1   ??? folic acid/multivit-min/lutein (CENTRUM SILVER ORAL) Take 1 tablet by mouth daily at 0600.      ??? HUMIRA PEN CITRATE FREE 40 MG/0.4 ML Inject 0.4 mL (40 mg total) under the skin every seven (7) days. 4 each 11   ??? HUMIRA PEN CITRATE FREE STARTER PACK FOR CROHN'S/UC/HS 3 X 80 MG/0.8 ML Inject the contents of 2 pens (160 mg) under the skin on day 1, THEN 1 pen (80 mg) on day 15. 3 each 0   ??? insulin aspart protamine-insulin aspart (NOVOLOG 70/30) 100 unit/mL (70-30) injection pen Inject 0.45 mL (45 Units total) under the skin two (2) times a day. 30 mL 11   ??? lancing device Misc USE 4 TIMES DAILY BEFORE MEALS AND NIGHTLY ( USAR CUATRO VECES AL DIA SPBM Y TODAS LAS NOCHES ) 1 each 1   ??? losartan (COZAAR) 25 MG tablet Take 1 tablet (25 mg total) by mouth daily. 90 tablet 3   ??? metFORMIN (GLUCOPHAGE) 1000 MG tablet Take 1 tablet (1,000 mg total) by mouth 2 (two) times a day with meals. 180 tablet 3   ??? nystatin (MYCOSTATIN) 100,000 unit/gram ointment Apply topically Two (2) times a day. To skin folds in the groin 90 g 1   ??? omeprazole (PRILOSEC) 20 MG capsule Take 1 capsule (20 mg total) by mouth daily. 90 capsule 3   ??? pen needle, diabetic (ULTICARE PEN NEEDLE) 32 gauge x 5/32 (4 mm) Ndle Use as directed two (2) times a day 100 each 12   ??? traZODone (DESYREL) 50 MG tablet Take 1 tablet (50 mg total) by mouth  nightly. 90 tablet 3     No current facility-administered medications for this visit.     Facility-Administered Medications Ordered in Other Visits   Medication Dose Route Frequency Provider Last Rate Last Admin   ??? matrix hemostatic sealant (FLOSEAL) topical            ??? matrix hemostatic sealant (FLOSEAL) topical                Past Medical History:   Diagnosis Date   ??? Anisocoria    ??? Cataract     Nuclear and Mild Cortical Cataracts, OU   ??? Depression    ??? Diabetes mellitus (CMS-HCC) Dx 1996    Type II   ??? Diabetic retinopathy (CMS-HCC)     PDR OU and mild DME avastin injections x6 right eye  and left eye x1,PRP 2015   ??? Dry eyes    ??? Fractures    ??? Glaucoma     History of Neovascular Glaucoma OD, indeterminate stage   ??? Hypertension    ??? PONV (postoperative nausea and vomiting)    ??? Shoulder injury    ??? Tuberculosis 1978    completed one year of treatment.        Past Surgical History:   Procedure Laterality Date   ??? ANKLE FRACTURE SURGERY     ??? CATARACT EXTRACTION EXTRACAPSULAR W/ INTRAOCULAR LENS IMPLANTATION Left 10/10/2019   ??? HAND SURGERY     ??? PR AQUEOUS SHUNT EXTRAOC EQUAT PLATE RSVR W/GRAFT Right 10/12/2013 ??? CATARACT EXTRACTION EXTRACAPSULAR W/ INTRAOCULAR LENS IMPLANTATION Left 10/10/2019   ??? HAND SURGERY     ??? PR AQUEOUS SHUNT EXTRAOC EQUAT PLATE RSVR W/GRAFT Right 10/12/2013    Procedure: AQUEOUS SHUNT-EXTRAOCULAR RESERVOIR;  Surgeon: Leda Roys, MD;  Location: MAIN OR [facility];  Service: Ophthalmology   ??? PR COLONOSCOPY W/BIOPSY SINGLE/MULTIPLE Left 11/16/2012    Procedure: COLONOSCOPY, FLEXIBLE, PROXIMAL TO SPLENIC FLEXURE; WITH BIOPSY, SINGLE OR MULTIPLE;  Surgeon: Lauretta Grill, MD;  Location: [facility] GI PROCEDURES [facility];  Service: Gastroenterology   ??? PR DRAIN ANT CHMBR,REMV BLOOD Right 10/12/2013    Procedure: PARACENTESIS (SEPART PROC); W/REMOV BLD;  Surgeon: Leda Roys, MD;  Location: MAIN OR [facility];  Service: Ophthalmology   ??? PR EYE SURG POST SGMT PROC UNLISTED Left 08/10/2016    PPV/EL/AIR left eye for VH   ??? PR OPEN RX STERNUM FRACTURE N/A 03/01/2018    Procedure: OPEN TX STERNUM FX W/WO SKELETAL FIXA;  Surgeon: Cherie Dark, MD;  Location: MAIN OR [facility];  Service: Thoracic   ??? PR REINFORCE SCLERA EYE W GRAFT Right 10/12/2013    Procedure: SCLERAL REINFORCEMENT (SEPART PROC); W/GFT;  Surgeon: Leda Roys, MD;  Location: MAIN OR [facility];  Service: Ophthalmology   ??? PR UPPER GI ENDOSCOPY,BIOPSY N/A 11/16/2012    Procedure: UGI ENDOSCOPY; WITH BIOPSY, SINGLE OR MULTIPLE;  Surgeon: Lauretta Grill, MD;  Location: [facility] PROCEDURES [facility];  Service: Gastroenterology   ??? PR UPPER GI ENDOSCOPY,BIOPSY N/A 05/08/2013    Procedure: UGI ENDOSCOPY; WITH BIOPSY, SINGLE OR MULTIPLE;  Surgeon: Clint Bolder, MD;  [facility];  Service: Gastroenterology   ??? PR VITRECTOMY,PANRETINAL LASER RX Left 09/24/2014    Procedure: VITRECTOMY, MECHANICAL, PARS PLANA APPROACH; WITH ENDOLASER PANRETINAL PHOTOCOAGULATION;  Surgeon: Rockwell Alexandria, MD;  [facility] OR [facility];  Service: Ophthalmology   ??? PR VITRECTOMY,PANRETINAL LASER RX Left 08/10/2016    Procedure: VITRECTOMY, MECHANICAL, PARS PLANA APPROACH; WITH  ENDOLASER PANRETINAL PHOTOCOAGULATION;  Surgeon: Nicholaus Corolla, MD;  Location: [facility] OR [facility];  Service: Ophthalmology   ??? PR VITRECTOMY,PANRETINAL LASER RX Right 06/26/2019    Procedure: VITRECTOMY, MECHANICAL, PARS PLANA APPROACH; WITH ENDOLASER PANRETINAL PHOTOCOAGULATION;  Surgeon: Nicholaus Corolla, MD;  Location: [facility] OR [facility];  Service: Ophthalmology   ??? PR XCAPSL CTRC RMVL INSJ IO LENS PROSTH W/O ECP Right 09/26/2019    Procedure: EXTRACAPSULAR CATARACT REMOVAL W/INSERTION OF INTRAOCULAR LENS PROSTHESIS, MANUAL OR MECHANICAL TECHNIQUE WITHOUT ENDOSCOPIC CYCLOPHOTOCOAGULATION;  Surgeon: Arville Care, MD;  Location: [facility] OR [facility];  Service: Ophthalmology   ??? PR XCAPSL CTRC RMVL INSJ IO LENS PROSTH W/O ECP Left 10/10/2019    Procedure: EXTRACAPSULAR CATARACT REMOVAL W/INSERTION OF INTRAOCULAR LENS PROSTHESIS, MANUAL OR MECHANICAL TECHNIQUE WITHOUT ENDOSCOPIC CYCLOPHOTOCOAGULATION;  Surgeon: Arville Care, MD;  Location: [facility] OR [facility];  Service: Ophthalmology   ??? SHOULDER SURGERY  2016    right   ??? TUBAL LIGATION     ??? YAG CAPSULOTOMY - OU - BOTH EYES Bilateral 11/08/2019       Social History     Socioeconomic History   ??? Marital status: Single     Spouse name: None   ??? Number of children: None   ??? Years of education: None   ??? Highest education level: None   Tobacco Use   ??? Smoking status: Never   ??? Smokeless tobacco: Never   Vaping Use   ??? Vaping Use: Never used   Substance and Sexual Activity   ??? Alcohol use: No   ??? Drug use: No   ??? Sexual activity: Not Currently     Birth control/protection: Post-menopausal   Other Topics Concern   ??? Do you use sunscreen? No   ??? Tanning bed use? No   ??? Are you easily burned? No   ??? Excessive sun exposure? No   ??? Blistering sunburns? 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 From [place] originally.        ** Merged History Encounter **       Family History   Problem Relation Age of Known Problems Paternal Aunt    ??? No Known Problems Paternal Uncle    ??? No Known Problems Maternal Grandmother    ??? No Known Problems Maternal Grandfather    ??? No Known Problems Paternal Grandmother    ??? Psoriasis Paternal Grandfather    ??? No Known Problems Other    ??? Clotting disorder Neg Hx    ??? Anesthesia problems Neg Hx    ??? Thyroid disease Neg Hx    ??? Amblyopia Neg Hx    ??? Blindness Neg Hx    ??? Cancer Neg Hx    ??? Cataracts Neg Hx    ??? Glaucoma Neg Hx    ??? Hypertension Neg Hx    ??? Macular degeneration Neg Hx    ??? Retinal detachment Neg Hx    ??? Strabismus Neg Hx    ??? Stroke Neg Hx    ??? Melanoma Neg Hx    ??? Basal cell carcinoma Neg Hx    ??? Squamous cell carcinoma Neg Hx    ??? Substance Abuse Disorder Neg Hx    ??? Mental illness Neg Hx             Objective        Vital signs: There were no vitals taken for this visit.     ***  DELETE IF NOT NEEDING ORTHOSTATICS***  There were no vitals filed for this visit.    Physical Exam:  @[id number] @     Diagnostic Studies and Review of Records   Labs:  {JB3 Labs Neuro:30376}    Imaging:  ***, ***, personally reviewed:  *** however denies anxiety or sadness.    NEUROLOGICAL EXAMINATION:     General/Mental Status:    Alert, conversant, able to follow conversation and interview.   Comprehension was intact. Recent and remote memory intact.    Attention span and concentration normal.    Spontaneous speech was fluent without word finding pauses, dysarthria, or paraphasic errors (as per daughter).  Fund of knowledge normal.    Cranial Nerves:   II, III - Pupils are reactive to light b/l (direct and consensual reactions). R 4->29mm, L 3->34mm. Poor visual acuity. Unable to see first line of Snellen chart. Unable to count number of fingers placed 6 inches away from eyes.  III, IV, VI - Pursuit eye movements were uninterrupted with full range and without more than end-gaze nystagmus. Extra ocular movements are intact, no ptosis, no diplopia, no nystagmus.  V - Sensation of the face intact b/l to light touch in all three divisions of CNV.   VII - Face symmetrical at rest, no facial droop. Normal facial movements bilaterally including forehead, eye closure, and smile/grimace.  VIII - Hearing grossly intact to conversation. Weber test: sound is symmetrical with no lateralization. Rhinne test: normal bilaterally.  IX and X - Palate movement is symmetric, no dysarthria, no dysphagia.  XI - Full shoulder shrug bilaterally; normal tone and strength of sternocleidomastoid muscles bilaterally.  XII - No tongue atrophy, no tongue fasciculations; tongue protrudes midline, full range of movements of the tongue.    Motor:   Normal bulk. No tremors, myoclonus, or other adventitious movement.  Pronator drift is absent. Fasciculations not observed.     Muscle strength:    Muscles UEs  LEs    R L  R L   Deltoids 5/5 5/5 Hip flexors  5/5 5/5   Biceps 5/5 5/5 Hip extensors 5/5 5/5   Triceps 5/5 5/5 Knee flexors 5/5 5/5   Hand grip 5/5 5/5 Knee extensors 5/5 5/5   Wrist flexors 5/5 5/5 Foot dorsal flexors 5/5 5/5   Wrist extensors 5/5 5/5 Foot plantar flexors 5/5 5/5                     Reflexes:    Reflexes R L   Biceps +2 +2   Brachioradialis  +2 +2   Triceps +2 +2   Patella +2 +2   Achilles +2 +2     Normal tone b/l. Negative Babinski sign bilaterally.    Sensory:   UEs LEs    R L R L   Light touch WNL WNL WNL WNL   Pin prick WNL WNL WNL WNL   Temperature WNL WNL WNL WNL   Joint position sense WNL WNL WNL WNL   Vibration WNL WNL WNL WNL       Cerebellar/Coordination:  Full finger-to-nose exam confounded by poor visual acuity. Able to place finger onto nose without ataxia or dysmetria bilaterally. Heel-to-shin bilaterally demonstrate no abnormalities.     Gait:  Confounded by poor visual acuity. Short steps, slightly wide-based gait, able to walk in a short straight line.         Diagnostic Studies and Review of Records   Labs:  No recent labs.  Imaging:  Personally reviewed:  MRI brain w/wo CN 8 protocol (10/30/19): No acute infarct, hemorrhage or intracranial lesion.

## 2021-02-12 NOTE — Unmapped (Signed)
You were seen today for your headaches and visual distortion, which is consistent with occipital neuralgia & migraine aura. We will start you on a low-dose nortriptyline to help. If you continue to have the symptoms after a few weeks, we can schedule for the injection.    For your memory issues, I will be referring you to a neuropsychologist. In addition, we would like an MRI.

## 2021-02-15 LAB — SYPHILIS SCREEN: SYPHILIS RPR SCREEN: NONREACTIVE

## 2021-02-17 NOTE — Unmapped (Signed)
[facility] Refill Coordination Note    Specialty Medication(s) to be Shipped:   Inflammatory Disorders: Humira    Other medication(s) to be shipped: No additional medications requested for fill at this time     Anne Barber, DOB: Aug 12, 1960  Phone: [phone] (home)       All above HIPAA information was verified with patient's family member, daughter.     Was a Nurse, learning disability used for this call? Yes, spanish. Patient language is appropriate in [facility]    Completed refill call assessment today to schedule patient's medication shipment from the [facility] [phone]).  All relevant notes have been reviewed.     Specialty medication(s) and dose(s) confirmed: Regimen is correct and unchanged.   Changes to medications: [name] reports no changes at this time.  Changes to insurance: No  New side effects reported not previously addressed with a pharmacist or physician: None reported  Questions for the pharmacist: No    Confirmed patient received a Conservation officer, historic buildings and a Surveyor, mining with first shipment. The patient will receive a drug information handout for each medication shipped and additional FDA Medication Guides as required.       DISEASE/MEDICATION-SPECIFIC INFORMATION        For patients on injectable medications: Patient currently has 1 doses left.  Next injection is scheduled for 11/29.    SPECIALTY MEDICATION ADHERENCE     Medication Adherence    Patient reported X missed doses in the last month: 0  Specialty Medication: Humira  Patient is on additional specialty medications: No  Patient is on more than two specialty medications: No  Any gaps in refill history greater than 2 weeks in the last 3 months: no  Demonstrates understanding of importance of adherence: yes  Informant: child/children              Were doses missed due to medication being on hold? No    Humira 40mg /0.31ml: Patient has 7 days of medication on hand     REFERRAL TO PHARMACIST     Referral to the pharmacist: Not needed      [facility]     Shipping address confirmed in Epic.     Delivery Scheduled: Yes, Expected medication delivery date: 12/2.     Medication will be delivered via UPS to the prescription address in Epic WAM.    Olga Millers   [facility] Specialty Technician

## 2021-02-18 LAB — VITAMIN B1, WHOLE BLOOD: VITAMIN B1: 172 nmol/L

## 2021-02-24 ENCOUNTER — Ambulatory Visit: Admit: 2021-02-24 | Discharge: 2021-02-25 | Payer: PRIVATE HEALTH INSURANCE | Attending: Family | Primary: Family

## 2021-02-24 DIAGNOSIS — J069 Acute upper respiratory infection, unspecified: Principal | ICD-10-CM

## 2021-02-24 DIAGNOSIS — I10 Essential (primary) hypertension: Principal | ICD-10-CM

## 2021-02-24 DIAGNOSIS — R051 Acute cough: Principal | ICD-10-CM

## 2021-02-24 MED ORDER — CETIRIZINE 10 MG TABLET
ORAL_TABLET | Freq: Every day | ORAL | 2 refills | 20.00000 days | Status: CP
Start: 2021-02-24 — End: 2021-03-03

## 2021-02-24 MED ORDER — DEXTROMETHORPHAN POLISTIREX ER 30 MG/5 ML ORAL SUSP EXT.RELEASE 12HR
Freq: Two times a day (BID) | ORAL | 0 refills | 8 days | Status: CP
Start: 2021-02-24 — End: ?

## 2021-02-24 MED ORDER — IBUPROFEN 600 MG TABLET
ORAL_TABLET | Freq: Four times a day (QID) | ORAL | 2 refills | 8 days | Status: CP | PRN
Start: 2021-02-24 — End: 2022-02-24

## 2021-02-24 NOTE — Unmapped (Signed)
[facility] at [facility], Acute Care Note    Chief Complaint   Patient presents with   ??? Cough     Ongoing dry cough (was productive) for four days, feels chest wheezing.       Assessment/Plan:     Anne Barber is a pleasant 60 y.o. female with respiratory infection, likely viral. COVID-19 PCR obtained today, will call patient with results. Advised she begin Zyrtec daily x7 days then prn, Delsum syrup BID prn, Ibuprofen prn for pain. Can continue use of Vik's Vapor rub for nighttime wheezing. Discussed that I would not recommend an inhaler at this time. Follow up if symptoms fail to improve or worsen at any point.       Diagnosis ICD-10-CM Plan/Associated Orders   1. Upper respiratory tract infection, unspecified type  J06.9 Zyrtec daily x7 days then prn, Delsum syrup BID prn, Ibuprofen prn for pain.    COVID-PCR      2. Acute cough  R05.1 COVID-19 PCR, see above           3. Primary hypertension  I10 Losartan 25 mg daily           20 minutes of clinical time time, > 1/2 the office visit, Medication adherence and barriers to the treatment plan have been addressed. Opportunities to optimize healthy behaviors have been discussed. Patient / caregiver voiced understanding.     Subjective:      Anne Barber is a 61 y.o. female for evaluation of respiratory infection.  Patient's symptoms include nonproductive cough and wheezing. She has also had a headache only when she coughs. Cough was initially productive however this is no longer the case. Wheezing has predominantly been affecting her at night. The patient has been suffering from these symptoms for approximately 4 days. Symptoms have been unchanged  since their onset. Medications used in the past to treat these symptoms include OTC Vik's Vapor rub. Suspected precipitants include exposure to grandson with a cough - no other symptoms. [name] also expresses that she was loading groceries into her car 1 week ago while it was raining so she got all wet and did not shower until the afternoon. Patient has not required emergency room treatment for these symptoms, and has not required hospitalization. The patient has not been intubated in the past.     Denies fever, chest pain, N/V/D, bowel or bladder issues, or swelling.     ROS: 12 systems reviewed, no other listed complaints.    Environmental History: unremarkable    Pertinent Past Med Hx:    Past Medical History:   Diagnosis Date   ??? Anisocoria    ??? Cataract     Nuclear and Mild Cortical Cataracts, OU   ??? Depression    ??? Diabetes mellitus (CMS-HCC) Dx 1996    Type II   ??? Diabetic retinopathy (CMS-HCC)     PDR OU and mild DME avastin injections x6 right eye  and left eye x1,PRP 2015   ??? Dry eyes    ??? Fractures    ??? Glaucoma     History of Neovascular Glaucoma OD, indeterminate stage   ??? Hypertension    ??? PONV (postoperative nausea and vomiting)    ??? Shoulder injury    ??? Tuberculosis 1978    completed one year of treatment.        Medications:     Current Outpatient Medications:   ???  alcohol swabs PadM, Test daily before all meals/snacks and once before bedtime., Disp:  400 each, Rfl: 1  ???  aspirin (ECOTRIN) 81 MG tablet, Take 1 tablet (81 mg total) by mouth daily., Disp: 150 tablet, Rfl: 2  ???  atorvastatin (LIPITOR) 40 MG tablet, Take 1 tablet (40 mg total) by mouth daily., Disp: 90 tablet, Rfl: 3  ???  BD INSULIN SYRINGE ULT-FINE II 1 mL 31 gauge x 5/16 Syrg, 1 Package by Miscellaneous route Two (2) times a day., Disp: 100 each, Rfl: 3  ???  blood sugar diagnostic (ONETOUCH ULTRA BLUE TEST STRIP) Strp, USE TO CHECK BLOOD SUGAR UP TO 10 TIMES DAILY AS NEEDED, Disp: 600 strip, Rfl: 1  ???  dorzolamide-timoloL (COSOPT) 22.3-6.8 mg/mL ophthalmic solution, Administer 1 drop to both eyes Two (2) times a day., Disp: 10 mL, Rfl: 12  ???  empty container Misc, Use as directed to dispose of Humira pens., Disp: 1 each, Rfl: 2  ???  fluticasone propionate (FLONASE) 50 mcg/actuation nasal spray, 1 spray into each nostril two (2) times a day., Disp: 48 g, Rfl: 1  ???  HUMIRA PEN CITRATE FREE 40 MG/0.4 ML, Inject 0.4 mL (40 mg total) under the skin every seven (7) days., Disp: 4 each, Rfl: 11  ???  HUMIRA PEN CITRATE FREE STARTER PACK FOR CROHN'S/UC/HS 3 X 80 MG/0.8 ML, Inject the contents of 2 pens (160 mg) under the skin on day 1, THEN 1 pen (80 mg) on day 15., Disp: 3 each, Rfl: 0  ???  insulin aspart protamine-insulin aspart (NOVOLOG 70/30) 100 unit/mL (70-30) injection pen, Inject 0.45 mL (45 Units total) under the skin two (2) times a day., Disp: 30 mL, Rfl: 11  ???  losartan (COZAAR) 25 MG tablet, Take 1 tablet (25 mg total) by mouth daily., Disp: 90 tablet, Rfl: 3  ???  metFORMIN (GLUCOPHAGE) 1000 MG tablet, Take 1 tablet (1,000 mg total) by mouth 2 (two) times a day with meals., Disp: 180 tablet, Rfl: 3  ???  nortriptyline (PAMELOR) 10 MG capsule, Take 1 capsule (10 mg total) by mouth nightly. If after 2 weeks, you still have the headache, you can increase to 2 capsules (20mg ) at night, Disp: 30 capsule, Rfl: 2  ???  omeprazole (PRILOSEC) 20 MG capsule, Take 1 capsule (20 mg total) by mouth daily., Disp: 90 capsule, Rfl: 3  ???  pen needle, diabetic (ULTICARE PEN NEEDLE) 32 gauge x 5/32 (4 mm) Ndle, Use as directed two (2) times a day, Disp: 100 each, Rfl: 12  ???  traZODone (DESYREL) 50 MG tablet, Take 1 tablet (50 mg total) by mouth nightly., Disp: 90 tablet, Rfl: 3  ???  blood-glucose meter kit, Use as instructed, Disp: 1 each, Rfl: 0  ???  cetirizine (ZYRTEC) 10 MG tablet, Take 1 tablet (10 mg total) by mouth daily for 7 days. Then as needed for congestion., Disp: 20 tablet, Rfl: 2  ???  clindamycin (CLEOCIN T) 1 % lotion, Apply topically every morning., Disp: 60 mL, Rfl: 5  ???  clindamycin (CLEOCIN T) 1 % Swab, Apply 1 application topically Two (2) times a day., Disp: 60 each, Rfl: 11  ???  dexamethasone (DECADRON) 0.1 % ophthalmic solution, Administer 4 drops into the left ear Two (2) times a day for 30 days, Disp: 5 mL, Rfl: 0  ???  dextromethorphan (DELSYM) 30 mg/5 mL liquid, Take 10 mL (60 mg total) by mouth Two (2) times a day. As needed for cough., Disp: 150 mL, Rfl: 0  ???  empagliflozin (JARDIANCE) 25 mg tablet, Take 1 tablet (25  mg total) by mouth daily. (Patient not taking: Reported on 02/24/2021), Disp: 90 tablet, Rfl: 3  ???  folic acid/multivit-min/lutein (CENTRUM SILVER ORAL), Take 1 tablet by mouth daily at 0600. , Disp: , Rfl:   ???  ibuprofen (MOTRIN) 600 MG tablet, Take 1 tablet (600 mg total) by mouth every six (6) hours as needed for pain., Disp: 30 tablet, Rfl: 2  ???  lancing device Misc, USE 4 TIMES DAILY BEFORE MEALS AND NIGHTLY ( USAR CUATRO VECES AL DIA SPBM Y TODAS LAS NOCHES ), Disp: 1 each, Rfl: 1  ???  nystatin (MYCOSTATIN) 100,000 unit/gram ointment, Apply topically Two (2) times a day. To skin folds in the groin, Disp: 90 g, Rfl: 1  No current facility-administered medications for this visit.    Facility-Administered Medications Ordered in Other Visits:   ???  matrix hemostatic sealant (FLOSEAL) topical, , , ,   ???  matrix hemostatic sealant (FLOSEAL) topical, , , ,      Allergies:   No Known Allergies    Objective:   BP 162/76 (BP Site: R Arm, BP Position: Sitting)  - Pulse 88  - Ht 157.5 cm (5' 2)  - Wt 90.7 kg (200 lb)  - SpO2 97%  - BMI 36.58 kg/m??     General Appearance: WDWN in NAD      Skin: W, D, I  HEENT: PERRLA, EOMI, TM's clear. Mld nasal congestion  Respiratory: Clear throughout. Dry cough noted  Cardio: RRR  Abdomen: Soft and non-tnder  Neurologic: Grossly intact  GU: Soft, non-tender No CVA tenderness  PSYCH: A & O X 4    Laboratory:     Lab Results   Component Value Date    WBC 6.8 01/29/2021    HGB 12.1 01/29/2021    HCT 37.2 01/29/2021    PLT 187 01/29/2021       Lab Results   Component Value Date    NA 136 01/29/2021    K 4.2 01/29/2021    CL 101 01/29/2021    CO2 26.0 01/29/2021    BUN 15 01/29/2021    CREATININE 0.70 01/29/2021    GLU 344 (H) 01/29/2021    CALCIUM 9.4 01/29/2021    MG 1.8 02/28/2011    PHOS 5.0 (H) 02/28/2011       Lab Results Component Value Date    BILITOT 0.3 01/29/2021    PROT 7.8 01/29/2021    ALBUMIN 3.9 01/29/2021    ALT 16 01/29/2021    AST 22 01/29/2021    ALKPHOS 104 01/29/2021    GGT 34 02/28/2011       Lab Results   Component Value Date    INR 0.90 12/30/2019    APTT 26.6 12/30/2019        I attest that I, Benita Stabile, personally documented this note while acting as scribe for Olena Leatherwood, NP.      Benita Stabile, Scribe.  02/24/2021     The documentation recorded by the scribe accurately reflects the service I personally performed and the decisions made by me.     Olena Leatherwood, NP    Dr. Betha Loa, DNP, FNP-BC  Board Certified Doctor of Nursing Practice   [phone]

## 2021-02-26 MED FILL — HUMIRA PEN CITRATE FREE 40 MG/0.4 ML: SUBCUTANEOUS | 28 days supply | Qty: 4 | Fill #7

## 2021-03-11 MED FILL — JARDIANCE 25 MG TABLET: ORAL | 90 days supply | Qty: 90 | Fill #3

## 2021-03-11 MED FILL — ATORVASTATIN 40 MG TABLET: ORAL | 90 days supply | Qty: 90 | Fill #1

## 2021-03-11 MED FILL — CLINDAMYCIN PHOSPHATE 1 % TOPICAL SWAB: TOPICAL | 30 days supply | Qty: 60 | Fill #2

## 2021-03-11 MED FILL — LOSARTAN 25 MG TABLET: ORAL | 90 days supply | Qty: 90 | Fill #3

## 2021-03-16 ENCOUNTER — Institutional Professional Consult (permissible substitution)
Admit: 2021-03-16 | Discharge: 2021-03-16 | Payer: PRIVATE HEALTH INSURANCE | Attending: Audiologist | Primary: Audiologist

## 2021-03-16 ENCOUNTER — Ambulatory Visit
Admit: 2021-03-16 | Discharge: 2021-03-16 | Payer: PRIVATE HEALTH INSURANCE | Attending: Student in an Organized Health Care Education/Training Program | Primary: Student in an Organized Health Care Education/Training Program

## 2021-03-16 DIAGNOSIS — E113513 Type 2 diabetes mellitus with proliferative diabetic retinopathy with macular edema, bilateral: Principal | ICD-10-CM

## 2021-03-16 DIAGNOSIS — H4311 Vitreous hemorrhage, right eye: Principal | ICD-10-CM

## 2021-03-16 DIAGNOSIS — H4051X3 Glaucoma secondary to other eye disorders, right eye, severe stage: Principal | ICD-10-CM

## 2021-03-16 DIAGNOSIS — H547 Unspecified visual loss: Principal | ICD-10-CM

## 2021-03-16 MED ORDER — DORZOLAMIDE 22.3 MG-TIMOLOL 6.8 MG/ML EYE DROPS
Freq: Two times a day (BID) | OPHTHALMIC | 12 refills | 100.00000 days | Status: CP
Start: 2021-03-16 — End: 2021-03-16
  Filled 2021-03-17: qty 10, 50d supply, fill #0

## 2021-03-16 NOTE — Unmapped (Unsigned)
[facility]  Adult Audiology     [facility] Adult Audiology Evaluation & Hearing Aid Follow-up Appointment      PATIENT: Anne Barber, Anne Barber  DOB: 22-Apr-1960  MRN: [id number]  DOS: 03/16/2021     PLEASE SEE AUDIOGRAM INCLUDING FULL REPORT IN MEDIA MANAGER TAB.     HISTORY      Anne Barber is a 60 y.o. female with a known *** hearing loss seen today for a hearing evaluation and hearing aid check. {Audiology Companion:88137} Today, patient reports ***.    Patient is referred by ***.    DEVICE INFORMATION       Left Ear   Manufacturer Signia   Model Pure C&G T 3AX   ??Serial Number [id number]   HAF DATE 05/26/2020   Warranty 04/09/2023   Receiver strength and size 40M   Ear mold [id number]   Bluetooth Available   Battery Rechargeable   Charger Serial Number [id number] [phone]   Loss and Damage Claim?? available   Other Notes: [place] [facility] program       HEARING EVALUATION      Otoscopy  RIGHT Ear: {otoscopy:64378::clear external auditory canal}  LEFT Ear: {otoscopy:64378::clear external auditory canal}    Cerumen Management  ***Cerumen was removed today without incident prior to completing hearing aid fitting procedures.  ***Cerumen unable to be removed in the clinic today. Referral to ENT placed in Epic for cerumen removal    Tympanometry - using a 226 Hz probe tone  RIGHT Ear: {tymp results:64372::Type A tympanogram, consistent with normal middle ear pressure, compliance, and volume}  LEFT Ear: {tymp results:64372::Type A tympanogram, consistent with normal middle ear pressure, compliance, and volume}    Acoustic Reflexes  RIGHT Contralateral (Stim. R, Meas. L): {present/absent:52582} at [phone] Hz  RIGHT Ipsilateral (Stim. R): {present/absent:52582} at [phone] Hz  LEFT Contralateral (Stim. L, Meas. R): {present/absent:52582} at [phone] Hz  LEFT Ipsilateral (Stim. L): {present/absent:52582} at [phone] Hz    Pure Tone and Speech Audiometry  Today's behavioral evaluation was completed using conventional audiometry via {Transducers:64381} with {test reliability:64382} reliability.     RIGHT Ear: ***Normal hearing sensitivity *** {Hearing Loss Type:69749} hearing loss  ??? Speech Recognition Threshold (SRT): *** dB HL  ??? Word Recognition Score (Recorded NU-6): ***% at *** dB HL    LEFT Ear: ***Normal hearing sensitivity *** {Hearing Loss Type:69749} hearing loss  ??? Speech Recognition Threshold (SRT): *** dB HL  ??? Word Recognition Score (Recorded NU-6): ***% at *** dB HL    Impressions   ***Results are {Improved/diminished/unchanged:15829} when compared to those on ***.     ***An asymmetry is noted in thresholds between ears.      ***An asymmetry is noted between ears for word recognition scores today (per SPRINT chart data). ***Today's word recognition score in ear demonstrated {Improved/diminished/unchanged:15829} performance when compared with score in previous audiogram on *** (per SPRINT chart data).       HEARING AID CHECK      A listening check was completed and the hearing aids were found to be in good working order, without evidence of static, weakness, or distortion.     Physical Fit:  Hearing aid(s) continue to fit snugly in patient's ear comfortably and appropriately.     Earmold(s): ***Earmold(s) remain a good fit with no signs of extreme wear and tear to material overtime.    ***Earmold(s) showed signs of wear and tear today.     Retention Locks: ***Retention lock remains attached to ensure retention.   ***Retention lock was added  today for improved retention.     Programming:   Datalogging: *** hours/day on average    Feedback management: {Incomplete Complete:64796}    Verification: {adultaudPrescriptiveTargets:82743}    The hearing aids were previously verified to best match prescriptive targets on ***    Other programming changes: ***  ***No programming changes were made today due to stable hearing and/or patient preference.      The hearing aids were programmed with the following settings:  Start-up Program: ***Autosense  P1: ***Universal  P2: ***  P3: ***  P4: ***  P5: ***    Other Features:  ??? Frequency Compression (transposition/lowering/shifting): {ZOXWR:60454}  o Verified: {YES/NO:29867}  ??? Telecoil: {UJWJX:91478}  ??? ***    User Settings:  ??? Push Button: {HAusersettings:82963}  ??? Toggle Function: {HAusersettings:82963}  ??? ***    BLUETOOTH CONNECTIVITY & SMARTPHONE APP   ***Hearing aids remain paired with smartphone Bluetooth. Phone streaming was reviewed and practiced in the clinic today.   ***Hearing aids remain paired with the *** App. Anne Barber showed ability to use App with aids.  ***Patient does not have a cellphone and/or is not interested in Bluetooth connectivity.     ACCESSORIES   Hearing aids remain paired with the *** today. Anne Barber was counseled regarding use of wireless accessory with aids and/or cell phone.    COUNSELING     The following concepts regarding hearing aid management/maintenance were discussed:  []  Device re-orientation: insertion/removal from ear and/or charger, battery replacement, cleaning strategies including wax filter/dome replacement (if applicable)  []  Warranty information - Counseled on repair warranty and one-time loss and damage warranty. Counseled regarding costs associated with out-of-warranty hearing aid services.  []  Hearing aid/Earmold cleaning - Demonstrated use of wax removal loop and brush. Practiced wax filter replacements. Counseled on use of a soft, dry cloth to clean the body of the hearing aid.   []  Hearing aid storage - Dehumidifier should be used nightly when hearing aid(s) are taken off before bed. Counseled how and when to replace the drying tablets. Instructed that the hearing aid(s) should be in the hearing aid case/charger or dehumidifier if not being worn.  []  Troubleshooting Strategies - Counseled on battery check as first for fresh or fully charged battery, check for wax blocking earmold or dome   []  Notified of walk-in clinic hours ([place] location: Fridays from 1-3:30pm)  []  Acclimatization process and importance of full time use of the device        Anne Barber was encouraged to contact the Adult Hearing Aid Program at [phone] for device troubleshooting questions or supplies as needed.      RECOMMENDATIONS   ??? Full time use of amplification   ??? Contact clinic when rechargeable batteries/tubing need to be replaced (8-9 months of full time usage)  ??? Return to clinic in approximately *** for hearing evaluation and hearing aid follow-up           ***, {Bachelor's VHQION:62952}  Audiology Graduate Student Clinician    I was physically present and immediately available to direct and supervise tasks that were related to patient management. The direction and supervision was continuous throughout the time these tasks were performed.    Care Provider(s) ***wore appropriate PPE throughout entire appointment (face mask and eye protection).  Patient's companion also wore face mask appropriately for the entire appointment (if applicable).  Patient {did/did WUX:32440} wear face mask appropriately for entirety of appointment.       Charges associated with today's visit:  ??? {  UNCADULTDIAGNOSTICCODING:73527}  ??? {UNCADULTDIAGNOSTICCODING:73527}  ??? {UNCADULTCODINGHAFITTING:74136}  ??? {UNCADULTCODINGHAFITTING:74136}  ??? {UNCADULTCODINGACCESSORY:74180}  ??? {UNCADULTCODINGHARETURN:74146}  ??? {UNCADULTCODINGHACONSULT:74145}  ??? HC No Charge; Qty: ***    Visit Time: {unccivisittime:73518}    Alanda Amass, AuD  Clinical Audiologist  [facility] Adult [facility] Program  Scheduling: [phone]
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
## 2021-03-17 ENCOUNTER — Ambulatory Visit
Admit: 2021-03-17 | Discharge: 2021-03-18 | Payer: PRIVATE HEALTH INSURANCE | Attending: Audiologist | Primary: Audiologist

## 2021-03-17 NOTE — Unmapped (Signed)
[facility]  Adult Audiology     [facility] HEARING AID TROUBLESHOOTING      PATIENT: Anne Barber, Anne Barber  DOB: 1960/11/05  MRN: [id number]  DOS: 03/16/2021     PLEASE SEE AUDIOGRAM INCLUDING FULL REPORT IN MEDIA MANAGER TAB.    REASON FOR VISIT    Patient was seen as a walk-in for hearing aid troubleshooting.    Patient reported the following in regards to left device(s):   ??? no sound or dead    Prior to completing troubleshooting tasks, the following was observed:  ??? significant cerumen/debris build-up was noted in the waxfilter     DEVICE INFORMATION      Hearing Aid device information:    ?? Left Ear   Manufacturer Signia   Model Pure C&G T 3AX   ??Serial Number [id number]   HAF DATE 05/26/2020   Warranty 04/09/2023   Receiver strength and size 16M   Ear mold [id number]   Bluetooth Available   Battery Rechargeable   Charger Serial Number [id number] [phone]   Loss and Damage Claim?? available   Other Notes: [place] [facility] program       IN-WARRANTY HEARING AID TROUBLESHOOTING     At this time, the patient's hearing aids are under manufacturer warranty.    ACTION TAKEN:   [x]  Listening check  [x]  The following parts were replaced during the troubleshooting process in an attempt to restore device functionality:  ??? wax filters    Once replaced and device cleaned, hearing aid was found to be functioning optimally.    RECOMMENDATIONS     Hearing Aid troubleshooting recommendations:  ??? The patient's is scheduled for hearing aid follow up with Dr. Valera Castle on 03/17/2021.    Charges associated with today's visit:  ??? HC No Charge; Qty: 1    Visit Time: 15 min    Mauri Brooklyn, AuD  Clinical Audiologist  [facility] Adult Audiology Program  Scheduling: [phone]

## 2021-03-25 ENCOUNTER — Ambulatory Visit
Admit: 2021-03-25 | Discharge: 2021-03-26 | Payer: PRIVATE HEALTH INSURANCE | Attending: Student in an Organized Health Care Education/Training Program | Primary: Student in an Organized Health Care Education/Training Program

## 2021-03-25 DIAGNOSIS — H4311 Vitreous hemorrhage, right eye: Principal | ICD-10-CM

## 2021-03-25 DIAGNOSIS — E113513 Type 2 diabetes mellitus with proliferative diabetic retinopathy with macular edema, bilateral: Principal | ICD-10-CM

## 2021-03-25 DIAGNOSIS — H4051X3 Glaucoma secondary to other eye disorders, right eye, severe stage: Principal | ICD-10-CM

## 2021-03-25 MED ADMIN — bevacizumab (AVASTIN) syringe: 1.25 mg | INTRAVITREAL | @ 21:00:00 | Stop: 2021-03-25

## 2021-03-25 NOTE — Unmapped (Signed)
#  HR PDR OD c/b [facility] (start 7 weeks ago)  - Diagnosed ~30 years ago  - Last A1c 7.9  - Treatment: insulin injections  - S/p PPV/EL/partial AFX right eye 06/26/19 Zhang/[facility]  - VA noted to be CF when blood was cleared, unclear etiology, possibly foveal ischemia will eventually need an FA  - B scan 12/29 shows retina attached 360, vitreous debris  - IVA OU consent obtained 03/25/21    Plan:  -IVA OD today  -RTC 6 weeks retina cap V/T/D/macOCT    #Quiescent PDR OS   -S/p PPV/EL/Air left eye  08/10/16 Farber/Zhang   -Good prp, no edema    Plan:  -Monitor    Discussed with Dr. Gust Brooms    Extended Ophthalmoscopy:  90D Lens  Macula - right:  VH  Macula - left:  Normal      *not addressed today*    #Hx NVG OD, indeterminate stage  - s/p emergent Ahmed Valve 10/12/13, valve functioning with tense overlying bleb  Patient bothered by bleb, will ask Dr. Marcell Anger to take a look at Feb appointment if anything can be done  - following with Dr. Marcell Anger    4. Pseudophakia both eyes   Stable. Monitor.   Seeing DF for glasses/MRx    5. Dry Eyes, OU  -- Likely contributing to intermittent blurry vision, OU  -- c/w ATs QID, Refresh PM qHS, Genteal, and warm compresses   -- today under control.

## 2021-03-25 NOTE — Unmapped (Signed)
[facility] REQUEST AND CONSENT FOR PROCEDURE    Mirinda Monte  [birth year]  [id number]    I authorize Clare Gandy, MD and/or associates and assistants of his/her choice at  [facility] (referred to herein as ???facility???) to perform the following procedure(s):  Fluorescein Angiography   I understand that surgical assistants and/or residents may perform selected tasks under the supervision of my attending surgeon(s). These tasks may include (if applicable): opening and closing a surgical site; dissecting tissue; removing tissue, blood or body fluids; injecting medications; harvesting grafts; transplanting tissue; administering anesthesia; implanting devices; inserting/removing/operating an endoscope for diagnosis or treatment; and placing invasive lines.     At the time of the procedure(s), the attending physician will determine the extent of participation by the surgical assistants and/or residents depending on: (1) the  complexity of the procedure; (2) my unique circumstances as the patient; and (3) the surgical assistants??? or residents??? training and experience.    2. I request that necessary and appropriate anesthesia and medications be given to me.    3. I understand that, during the procedure(s), it is possible for something unexpected to happen that may require another or different procedure(s) be performed on me. In that situation, I authorize my above-named health care providers or providers identified  as necessary by my surgical team to do what is medically necessary and appropriate for me.    4. I have discussed with my health care provider the following issues, as appropriate to my care [initial one]:   ______ Authorization for Blood Products: I authorize medically necessary blood and blood products be given to me  before, during or after the procedure(s), as determined by my heath care provider;  _____ Refusal to Authorize Blood Products: I do NOT authorize blood or blood products be given to me. (The patient or his/her guardian MUST also complete the facility???s form for refusal of blood or blood products.)    5. I have had an opportunity to ask questions, have had those questions answered, and have received sufficient information so that I have a general understanding of:  a. my medical condition,  b. the nature and benefits of the procedure(s),  c. the usual and most frequent risks of the procedure(s),  d. the risks and benefits of the alternative treatment(s), and  e. the prognosis of my condition with and without the procedure(s).  6. I am aware that the practice of medicine (including surgery) is not an exact science, and no one has made any guarantees  about the results of my procedure(s).    7. I understand the procedure(s) may result in the use of a human tissue implant, non-human implant or collagen received from a facility registered with the [place] Food and Drug Administration. Risks with implanted tissue include infection from bacteria or viruses which include but may not be limited to HIV and/or the hepatitis viruses.    8. I give permission for employees, agents, or independent contractors of the facility to do the following, as long as any action they take is consistent with policies and laws that protect my rights:  a) take photographs or make videos or drawings of me for permissible treatment, payment, or health care operations purposes (which may include quality assessment, education, and training), and to use or disclose such photographs, videos or drawings consistent with these purposes;  b) examine and dispose of any tissue, blood, or body parts that may be removed during the procedure(s), or use such tissue, blood, or body  parts removed during the procedure(s) for education or research; and  c) for the purposes of advancing healthcare education, I give consent for observers authorized by the facility to be present during the procedure(s).    9. For women of childbearing age: I understand there may be the potential need for diagnostic x-ray(s) during my procedure(s). Exposure to x-ray may cause serious injury to an unborn fetus. Large x-ray exposures to an unborn fetus have been known to cause birth defects and abortion. Doses from diagnostic x-rays are not considered ???large,??? but there is a potential risk of serious injury to an unborn fetus. I understand and have no further questions.    10. Based on my discussion with my health care provider and the information that I have received, I give my consent to the procedure(s). I confirm that I have read this form, or that it was read to me, that all blank spaces were filled in as  appropriate, and all sections that I do not agree with were crossed out and initialed before I signed below.    ______________________________________________________________________  Signature of Patient (or person authorized to sign for patient)   March 25, 2021 1:53 PM    ______________________________________________________________________  Relationship to Patient (if applicable)    WITNESS CERTIFICATION  The patient (or person authorized to sign for the patient) has answered yes to all of the following questions:  a) Did a health care provider explain the procedure(s) to you?  b) Did a health care provider explain that selected tasks may be performed by assistant(s)/resident(s)?  c) Did a health care provider explain alternative procedures and treatments and their risks and benefits?  d) Have you given your consent for the procedure(s)?  e) Have all of your questions about the procedure(s) been answered?    ___________________________________ _______________________________   Witness Signature    ___________________________________________________________________  Printed name  March 25, 2021 1:53 PM

## 2021-03-26 MED FILL — HUMIRA PEN CITRATE FREE 40 MG/0.4 ML: SUBCUTANEOUS | 28 days supply | Qty: 4 | Fill #8

## 2021-03-26 MED FILL — NORTRIPTYLINE 10 MG CAPSULE: ORAL | 15 days supply | Qty: 30 | Fill #1

## 2021-03-26 MED FILL — ALCOHOL PREP PADS: 67 days supply | Qty: 400 | Fill #1

## 2021-03-26 MED FILL — ULTICARE PEN NEEDLE 32 GAUGE X 5/32" (4 MM): SUBCUTANEOUS | 50 days supply | Qty: 100 | Fill #5

## 2021-03-26 MED FILL — ASPIRIN 81 MG TABLET,DELAYED RELEASE: ORAL | 150 days supply | Qty: 150 | Fill #1

## 2021-03-26 MED FILL — NOVOLOG MIX 70-30 FLEXPEN U-100 INSULIN 100 UNIT/ML SUBCUTANEOUS PEN: SUBCUTANEOUS | 33 days supply | Qty: 30 | Fill #5

## 2021-03-26 NOTE — Unmapped (Signed)
.  att

## 2021-03-29 NOTE — Unmapped (Signed)
I have seen the patient, discussed and examined the patient with the resident, participated in the key portions of service. I have reviewed and agree with the resident's note.    Anju Sereno, MD

## 2021-04-09 NOTE — Unmapped (Signed)
Patient's daughter called (not sure which one) and stated patient is scheduled for an audiology appointment in 2/17, but was told by audiology that there is no referral in the system and to please place one before scheduled appointment. Patient also used to be a patient of Dr. Tiana Loft. Thanks.

## 2021-04-10 DIAGNOSIS — H9190 Unspecified hearing loss, unspecified ear: Principal | ICD-10-CM

## 2021-04-10 NOTE — Unmapped (Signed)
Patient called requesting a referral to [facility] [address], For Hearing Loss.  This is an issue that has been discussed previously.   Patient has seen as a walking in  previously and was last seen on 03/17/2021. Patient has a specific location/provider in mind: [place] [facility]  [address] Marland Kitchen   Last office visit: 02/24/2021

## 2021-04-21 NOTE — Unmapped (Signed)
[facility] Refill Coordination Note    Specialty Medication(s) to be Shipped:   Inflammatory Disorders: Humira    Other medication(s) to be shipped: Clindamycin swab, Novolog, Metformin, Trazodone and dorzolamide-timoloL 22.3-6.8 mg/mL ophthalmic solution (COSOPT)     Anne Barber, DOB: 1961/03/26  Phone: [phone] (home)       All above HIPAA information was verified with patient's family member, daughter.     Was a Nurse, learning disability used for this call? No    Completed refill call assessment today to schedule patient's medication shipment from the [facility] [phone]).  All relevant notes have been reviewed.     Specialty medication(s) and dose(s) confirmed: Regimen is correct and unchanged.   Changes to medications: [name] reports no changes at this time.  Changes to insurance: No  New side effects reported not previously addressed with a pharmacist or physician: None reported  Questions for the pharmacist: No    Confirmed patient received a Conservation officer, historic buildings and a Surveyor, mining with first shipment. The patient will receive a drug information handout for each medication shipped and additional FDA Medication Guides as required.       DISEASE/MEDICATION-SPECIFIC INFORMATION        For patients on injectable medications: Patient currently has 1 doses left.  Next injection is scheduled for 04/26/2021.    SPECIALTY MEDICATION ADHERENCE     Medication Adherence    Patient reported X missed doses in the last month: 0  Specialty Medication: Humira  Patient is on additional specialty medications: No  Any gaps in refill history greater than 2 weeks in the last 3 months: no  Demonstrates understanding of importance of adherence: yes  Informant: child/children  Reliability of informant: reliable  Confirmed plan for next specialty medication refill: delivery by pharmacy  Refills needed for supportive medications: not needed              Were doses missed due to medication being on hold? No    Humira 40mg /0.33ml: Patient has 7 days of medication on hand     REFERRAL TO PHARMACIST     Referral to the pharmacist: Not needed      [facility]     Shipping address confirmed in Epic.     Delivery Scheduled: Yes, Expected medication delivery date: 04/23/2021.     Medication will be delivered via Same Day Courier to the prescription address in Epic WAM.    Anne Barber   [facility] Specialty Technician

## 2021-04-23 MED FILL — DORZOLAMIDE 22.3 MG-TIMOLOL 6.8 MG/ML EYE DROPS: OPHTHALMIC | 50 days supply | Qty: 10 | Fill #1

## 2021-04-23 MED FILL — NOVOLOG MIX 70-30 FLEXPEN U-100 INSULIN 100 UNIT/ML SUBCUTANEOUS PEN: SUBCUTANEOUS | 33 days supply | Qty: 30 | Fill #6

## 2021-04-23 MED FILL — CLINDAMYCIN PHOSPHATE 1 % TOPICAL SWAB: TOPICAL | 30 days supply | Qty: 60 | Fill #3

## 2021-04-23 MED FILL — METFORMIN 1,000 MG TABLET: ORAL | 90 days supply | Qty: 180 | Fill #1

## 2021-04-23 MED FILL — TRAZODONE 50 MG TABLET: ORAL | 90 days supply | Qty: 90 | Fill #1

## 2021-04-23 MED FILL — HUMIRA PEN CITRATE FREE 40 MG/0.4 ML: SUBCUTANEOUS | 28 days supply | Qty: 4 | Fill #9

## 2021-04-30 ENCOUNTER — Ambulatory Visit: Admit: 2021-04-30 | Discharge: 2021-04-30 | Payer: PRIVATE HEALTH INSURANCE

## 2021-04-30 DIAGNOSIS — R1032 Left lower quadrant pain: Principal | ICD-10-CM

## 2021-04-30 DIAGNOSIS — R1031 Right lower quadrant pain: Principal | ICD-10-CM

## 2021-04-30 NOTE — Unmapped (Signed)
Assessment  Problem List Items Addressed This Visit    None  Visit Diagnoses     Bilateral lower abdominal pain                 Doubt gynecologic etiology  Consider possible fibromyalgia.  Current meds include a TCA       Plan      >consider f/u w/Rheumatolgy  Follow up as needed.    I personally spent 30 minutes face-to-face and non-face-to-face in the care of this patient, which includes all pre, intra, and post visit time on the date of service.  All documented time was specific to the E/M visit and does not include any procedures that may have been performed.         Patient ID: Anne Barber, female   DOB: 1961-02-17, 61 y.o.   MRN: [id number]    Chief Complaint   Patient presents with   ??? Gynecologic Exam       HPI  Anne Barber is a 61 y.o. female.  She is seen at the request of Benjie Karvonen, MD.  Anne Barber has a long history abdominal pain.  The history was gleaned with the assistance of a Spanish interpreter.  She describes a left upper quadrant inflammation that feels 'hard.  She also endorses bilateral lower quadrant pain that she describes as ovarian pain.  She experiences the pain when she is sitting.  The pain is not progressive.  Associated symptoms include back pain and constipation.  The pain is relieved with a bowel movement, breast and over-the-counter analgesics.  Her history is remarkable for depression.  She denies any prior back injury or hernias.   Prior evaluation has included TVUS and CT of the abdomen and pelvis. The ovaries were not visualized on an ultrasound performed in 2018.      Review of prior data:  CT abd/pelvis 10/19:  Uterus and bilateral adnexa are unremarkable.         Review of Systems  Review of Systems  Constitutional: negative for fatigue and weight loss  Respiratory: negative for cough and wheezing  Cardiovascular: negative for chest pain, fatigue and palpitations  Gastrointestinal: positive for abdominal pain and constipation  Genitourinary:negative for abnormal discharge  Integument/breast: negative for nipple discharge  Musculoskeletal:negative for myalgias  Neurological: negative for gait problems and tremors  Behavioral/Psych: negative for abusive relationship, depression  Endocrine: negative for temperature intolerance        The following portions of the patient's history were reviewed and updated as appropriate: allergies, current medications, past family history, past medical history, past social history, past surgical history and problem list.    Blood pressure 133/61, pulse 85, weight 92.4 kg (203 lb 11.2 oz), not currently breastfeeding.    Physical Exam  Physical Exam  General:   alert   Skin:   no rash or abnormalities   Lungs:   clear to auscultation bilaterally   Heart:   regular rate and rhythm, S1, S2 normal, no murmur, click, rub or gallop   Abdomen:   soft, no hernia, multiple trigger/tender points; +/- b/l Carnett sign   Pelvis:  External genitalia: normal general appearance  Urinary system: urethral meatus normal and bladder without fullness, nontender  Vaginal: b/l OI/LA tenderness; low Brinks score  Cervix: normal appearance  Adnexa: normal bimanual exam  Uterus: anteverted and non-tender, normal size      Pain w/hip abduction    Fibromyalgia questionnaire: WPI--16    SS--8

## 2021-05-13 ENCOUNTER — Ambulatory Visit
Admit: 2021-05-13 | Discharge: 2021-05-14 | Payer: PRIVATE HEALTH INSURANCE | Attending: Student in an Organized Health Care Education/Training Program | Primary: Student in an Organized Health Care Education/Training Program

## 2021-05-13 NOTE — Unmapped (Unsigned)
[facility]  Adult Audiology     [facility] Adult Audiology Evaluation & Hearing Aid Follow-up Appointment      PATIENT: Anne, Barber  DOB: [birth year]  MRN: [id number]  DOS: 05/14/2021    PLEASE SEE AUDIOGRAM INCLUDING FULL REPORT IN MEDIA MANAGER TAB.     HISTORY      Anne Barber is a 61 y.o. female with a known asymmetric sensorineural hearing loss seen today for a hearing evaluation and hearing aid check. Patient was accompanied by a family member to today's appointment. Today, patient reports improved hearing since the last visit after we fixed her hearing aid.    Patient is referred by Cleon Dew, FNP.    DEVICE INFORMATION       Left Ear   Manufacturer [employer]   Model Pure C&G T 3AX   ??Serial Number [id number]   HAF DATE 05/26/2020   Warranty 04/09/2023   Receiver strength and size 34M   Ear mold [id number]   Bluetooth Available   Battery Rechargeable   Charger Serial Number [id number]   Loss and Damage Claim?? available   Other Notes: [place] [facility] program   ??    HEARING EVALUATION      Otoscopy  RIGHT Ear: clear external auditory canal  LEFT Ear: clear external auditory canal    Tympanometry - using a 226 Hz probe tone  RIGHT Ear: Type C tympanogram, consistent with negative middle ear pressure  LEFT Ear: Type A tympanogram, consistent with normal middle ear pressure, compliance, and volume    Pure Tone and Speech Audiometry  Today's behavioral evaluation was completed using conventional audiometry via insert earphones with good reliability.     RIGHT Ear: Profound sensorineural hearing loss  ??? Speech Awareness Threshold (SAT): 110 dB HL  ??? Word Recognition Score (Recorded NU-6): 0% at 110 dB HL    LEFT Ear: Mild to moderate sensorineural hearing loss  ??? Speech Recognition Threshold (SRT): 45 dB HL  ??? Word Recognition Score (Recorded NU-6): 96% at 70 dB HL    Impressions   Results are diminished when compared to those on 02/10/2020.     An asymmetry is noted in thresholds between ears.      An asymmetry is noted between ears for word recognition scores today (per SPRINT chart data). Today's word recognition score in ear demonstrated unchanged performance when compared with score in previous audiogram on 02/10/2020 (per SPRINT chart data).       HEARING AID CHECK      A listening check was completed and the hearing aids were found to be in good working order, without evidence of static, weakness, or distortion.     Physical Fit:  Hearing aid(s) continue to fit snugly in patient's ear comfortably and appropriately.     Earmold(s): Earmold(s) remain a good fit with no signs of extreme wear and tear to material overtime.      Programming:     Other programming changes: updated audiogram within the software      The hearing aids were programmed with the following settings:  Start-up Program:   P1: Universal      BLUETOOTH CONNECTIVITY & SMARTPHONE APP   Hearing aids remain paired with smartphone Bluetooth. Phone streaming was reviewed and practiced in the clinic today.         COUNSELING     The following concepts regarding hearing aid management/maintenance were discussed:  []  Device re-orientation: insertion/removal from ear and/or charger, battery replacement, cleaning strategies including wax  filter/dome replacement (if applicable)  []  Warranty information - Counseled on repair warranty and one-time loss and damage warranty. Counseled regarding costs associated with out-of-warranty hearing aid services.  []  Hearing aid/Earmold cleaning - Demonstrated use of wax removal loop and brush. Practiced wax filter replacements. Counseled on use of a soft, dry cloth to clean the body of the hearing aid.   []  Hearing aid storage - Dehumidifier should be used nightly when hearing aid(s) are taken off before bed. Counseled how and when to replace the drying tablets. Instructed that the hearing aid(s) should be in the hearing aid case/charger or dehumidifier if not being worn.  []  Troubleshooting Strategies - Counseled on battery check as first for fresh or fully charged battery, check for wax blocking earmold or dome   []  Notified of walk-in clinic hours ([place] location: Fridays from 1-3:30pm)  []  Acclimatization process and importance of full time use of the device        Anne Barber was encouraged to contact the Adult Hearing Aid Program at [phone] for device troubleshooting questions or supplies as needed.      RECOMMENDATIONS   ??? Full time use of amplification   ??? Contact clinic when rechargeable batteries/tubing need to be replaced (8-9 months of full time usage)  ??? Return to clinic in approximately 1 year for hearing evaluation and hearing aid follow-up       Anne Barber, B.S.  Audiology Graduate Student Clinician    I was physically present and immediately available to direct and supervise tasks that were related to patient management. The direction and supervision was continuous throughout the time these tasks were performed.    Care Provider(s) wore appropriate PPE throughout entire appointment (face mask and eye protection).  Patient's companion also wore face mask appropriately for the entire appointment (if applicable).  Patient did wear face mask appropriately for entirety of appointment.       Charges associated with today's visit:  ??? CPT 92557 - Comprehensive Audio Eval & Speech Recognition  ??? CPT E974542 - Tympanometry      Visit Time: 60 min    Alanda Amass, AuD  Clinical Audiologist  [facility] Adult Audiology Program  Scheduling: [phone] Cross[place] location: Fridays from 1-3:30pm)  []  Acclimatization process and importance of full time use of the device        Anne Barber was encouraged to contact the Adult Hearing Aid Program at [phone] for device troubleshooting questions or supplies as needed.      RECOMMENDATIONS   ??? Full time use of amplification   ??? Contact clinic when rechargeable batteries/tubing need to be replaced (8-9 months of full time usage)  ??? Return to clinic in approximately *** for hearing evaluation and hearing aid follow-up           ***, {Bachelor's VHQION:62952}  Audiology Graduate Student Clinician    I was physically present and immediately available to direct and supervise tasks that were related to patient management. The direction and supervision was continuous throughout the time these tasks were performed.    Care Provider(s) ***wore appropriate PPE throughout entire appointment (face mask and eye protection).  Patient's companion also wore face mask appropriately for the entire appointment (if applicable).  Patient {did/did WUX:32440} wear face mask appropriately for entirety of appointment.       Charges associated with today's visit:  ??? {UNCADULTDIAGNOSTICCODING:73527}  ??? {UNCADULTDIAGNOSTICCODING:73527}  ??? {UNCADULTCODINGHAFITTING:74136}  ??? {UNCADULTCODINGHAFITTING:74136}  ??? {UNCADULTCODINGACCESSORY:74180}  ??? {UNCADULTCODINGHARETURN:74146}  ??? {  UNCADULTCODINGHACONSULT:74145}  ??? HC No Charge; Qty: ***    Visit Time: {unccivisittime:73518}    Alanda Amass, AuD  Clinical Audiologist  [facility] Adult Audiology Program  Scheduling: [phone]

## 2021-05-14 ENCOUNTER — Ambulatory Visit
Admit: 2021-05-14 | Discharge: 2021-05-15 | Payer: PRIVATE HEALTH INSURANCE | Attending: Audiologist | Primary: Audiologist

## 2021-05-14 DIAGNOSIS — L732 Hidradenitis suppurativa: Principal | ICD-10-CM

## 2021-05-14 NOTE — Unmapped (Signed)
#  HR PDR OD c/b [facility]   - Diagnosed ~30 years ago  - Last A1c 7.9  - Treatment: insulin injections  - S/p PPV/EL/partial AFX right eye 06/26/19 Zhang/Deans  - VA noted to be CF when blood was cleared, unclear etiology, possibly foveal ischemia will eventually need an FA  - B scan 12/29 shows retina attached 360, vitreous debris  - IVA OU consent obtained 03/25/21  - IVA OD 03/25/21    Impression/plan:  - seen today, VH 75% resolved; there is NO active NV on exam and no indication for further injection at this time  - VA improved from HM to 20/200  - continue monitoring; discussed with patient that VH should continue to gradually clear  - discussed return precautions  - RTC 6 weeks retina cap V/T/D/macOCT      #Quiescent PDR OS   -S/p PPV/EL/Air left eye  08/10/16 Farber/Zhang   -Good prp, no edema    Plan:  -Monitor     Extended ophthalmoscopy report without scleral depression (20D)  Retinal Periphery - right:  PRP 360  Retinal Periphery - left:  PRP 360      *not addressed today*    #Hx NVG OD, indeterminate stage  - s/p emergent Ahmed Valve 10/12/13, valve functioning with tense overlying bleb  - RTC 40m glaucoma CAP    # Pseudophakia both eyes   Stable. Monitor.     # Dry Eyes, OU  -- Likely contributing to intermittent blurry vision, OU  -- c/w ATs QID, Refresh PM qHS, Gente[place], and warm compresses   -- today under control.

## 2021-05-25 NOTE — Unmapped (Unsigned)
Dermatology Note     Assessment and Plan:      Hidradenitis suppurativa of groin and buttocks, chronic disease, improved on Humira,??previously Hurley Stage 3:  Improved after addition of Humira after failing doxycyline (lack of efficacy), spironolactone (side effect of elevated blood sugars) and clindamycin swabs    High risk medication use  ***      The patient was advised to call for an appointment should any new, changing, or symptomatic lesions develop.     RTC: No follow-ups on file. or sooner as needed   _________________________________________________________________      Chief Complaint     No chief complaint on file.      HPI     Anne Barber is a 61 y.o. female who presents as a returning patient (last seen 11/23/2020) to Dermatology for follow up of HS.At last visit, she was continued on humira 40mg  weekly & clindamycin swabs. She was also referred to [facility] for evaluation of ovarian pain. Per the GYN notes, they think this is most consistent with fibromyalgia.     The patient denies any other new or changing lesions or areas of concern.     Pertinent Past Medical History     {derm PMH :75456}    Problem List    None    ***    Family History:   {derm skin check melanoma history:75452}    Past Medical History, Family History, Social History, Medication List, Allergies, and Problem List were reviewed in the rooming section of Epic.     ROS: Other than symptoms mentioned in the HPI, {derm skin check ros:75408::no fevers, chills, or other skin complaints}    Physical Examination     GENERAL: Well-appearing female in no acute distress, resting comfortably.  {PE systems:75418::NEURO: Alert and oriented, answers questions appropriately}  {PE extent:75514}  {PE list:75421}  ***    {PE limitations:75419::All areas not commented on are within normal limits or unremarkable}      (Approved Template 12/09/2019)

## 2021-05-31 ENCOUNTER — Ambulatory Visit: Admit: 2021-05-31 | Discharge: 2021-06-01 | Payer: PRIVATE HEALTH INSURANCE

## 2021-05-31 DIAGNOSIS — E669 Obesity, unspecified: Principal | ICD-10-CM

## 2021-05-31 DIAGNOSIS — R059 Cough, unspecified type: Principal | ICD-10-CM

## 2021-05-31 DIAGNOSIS — E11319 Type 2 diabetes mellitus with unspecified diabetic retinopathy without macular edema: Principal | ICD-10-CM

## 2021-05-31 DIAGNOSIS — I10 Essential (primary) hypertension: Principal | ICD-10-CM

## 2021-05-31 DIAGNOSIS — L732 Hidradenitis suppurativa: Principal | ICD-10-CM

## 2021-05-31 DIAGNOSIS — E113513 Type 2 diabetes mellitus with proliferative diabetic retinopathy with macular edema, bilateral: Principal | ICD-10-CM

## 2021-05-31 DIAGNOSIS — H4051X2 Glaucoma secondary to other eye disorders, right eye, moderate stage: Principal | ICD-10-CM

## 2021-05-31 DIAGNOSIS — H269 Unspecified cataract: Principal | ICD-10-CM

## 2021-05-31 DIAGNOSIS — Z794 Long term (current) use of insulin: Principal | ICD-10-CM

## 2021-05-31 DIAGNOSIS — H4312 Vitreous hemorrhage, left eye: Principal | ICD-10-CM

## 2021-05-31 MED ORDER — EMPAGLIFLOZIN 25 MG TABLET
ORAL_TABLET | Freq: Every day | ORAL | 3 refills | 90 days | Status: CP
Start: 2021-05-31 — End: ?
  Filled 2021-06-02: qty 90, 90d supply, fill #0

## 2021-05-31 MED ORDER — AMLODIPINE 5 MG TABLET
ORAL_TABLET | Freq: Every day | ORAL | 1 refills | 90 days | Status: CP
Start: 2021-05-31 — End: 2022-05-31
  Filled 2021-06-02: qty 90, 90d supply, fill #0

## 2021-05-31 MED ORDER — LOSARTAN 25 MG TABLET
ORAL_TABLET | Freq: Every day | ORAL | 3 refills | 90 days | Status: CP
Start: 2021-05-31 — End: ?
  Filled 2021-06-02: qty 90, 90d supply, fill #0

## 2021-05-31 MED ORDER — CETIRIZINE 10 MG TABLET
ORAL_TABLET | Freq: Every day | ORAL | 2 refills | 30 days | Status: CP
Start: 2021-05-31 — End: 2022-05-31
  Filled 2021-06-02: qty 30, 30d supply, fill #0

## 2021-05-31 MED ORDER — SEMAGLUTIDE 7 MG TABLET
ORAL_TABLET | Freq: Every day | ORAL | 2 refills | 0 days | Status: CP
Start: 2021-05-31 — End: ?

## 2021-05-31 NOTE — Unmapped (Signed)
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) - Primary   Obesity (BMI 30.0-34.9), hyperlipidemia         - A1c: 8.2 (7.9-8.1-7.9)  - Pt not keen on more injectables  -Continue 1000 mg meformin BID, 25 jardiance, 81 mg ASA, 40 mg atorvastatin, Novolog 70/30 42-45 U BID  - Added semaglutide 7 mg  -Diet advice, activity                           Cardiovascular and Mediastinum    Hypertension    Relevant Medications    -25 mg losartan, Added:5 mg amlodipine  - DASH diet, activity  -        - Cr: 0.70, K 4.2, LDL 64, total cholesterol : 151, Triglycerides 226 (11/22)             Musculoskeletal and Integument    Hidradenitis suppurativa      - Doing well on Humira, Medicaid recently denied    - Will message Dermatology regarding denial             Other  Proliferative diabetic retinopathy with macular edema associated with type 2 diabetes mellitus (CMS-HCC)    Cataract    Neovascular glaucoma of right eye, moderate stage    Vitreous hemorrhage of left eye (CMS-HCC)         -Complaint with drops, managed by [facility]     - Last visit 05/13/21, follow-up in 6 weeks     Other Visit Diagnoses     Cough, unspecified type        Relevant Medications    -Refilled: cetirizine (ZYRTEC) 10 MG tablet  -Fluids, steam        Headaches:         - Feels TCA helpful, concerned about memory--has upcoming [facility] PM [facility] R appointment 06/17/21    Chronic stomach pains:         - Seen by [facility], did not think due to GYN problem       - Possible fibromyalgia. ANA, Rheumatoid factor in 2021--negative       - Taking Colace for constipation, complaint with PPI       - Diet advice, stress management, activity    I personally spent 30 minutes face-to-face and non-face-to-face in the care of this patient, which includes all pre, intra, and post visit time on the date of service.    Return if symptoms worsen or fail to improve, for 1-2 Months-DM, HTN.    HPI:      Anne Barber  is here for   Chief Complaint   Patient presents with   ??? Follow-up     Here to follow up on diabetes   ??? Cough   Patient was last seen by me 01/28/2021 for diabetes mellitus management, anemia, GERD, insomnia, depression w/ anxiety, and immuizations. They are here today for a 4 months follow up appointment.         DM:    Diet: watching diet, carbs.  Walks 15 minutes on treadmill daily--then gets tired  Fastings: Occasional 170s, mostly 120-130s  Insulin 42-45 U BID, taking metformin 1000 mg BID, jardiance, aspirin, 40 mg atorvastatin   Not keen on more injectables  Followed by [facility]--last seen 05/13/21-next appointment  in 6 weeks  Complaint with Cosopt, artifical tears, decadron    HTN:  Took 25 mg losartan, aware BP elevated.  Open to another medication, if needed  Cr: 0.70, K 4.2, LDL 64, total cholesterol : 151, Triglycerides 226 (11/22)      Cough:  Dry cough since getting COVID booster, Felt anti-histamine helpful- would like refill on ceterizine      HS:  Humaira has been helping, Medicaid denied, has dermatology appointment 06/09/21, uncertain reason for denial      Chronic stomach pain:  Worse with stitting--can sit for one hour then, symptoms return  Central and lower part of stomach, stomach feels swollen  No nausea, no vomiting, no fever, no chills. No urinary symptoms, seen by OB GYN did not think due to GYN problem, possible fibromyalgia  ANA, rheumatoid factor (10/21): negative.  Taking colace--helpful for constipation, soft and formed BMs  Pt thinks area where she is injecting Humara and insulin causing stomach pains.    Headaches:  Taking 20 mg nortipyline--helping headaches  Does not help memory, feels short term memory decreasing--has neurology and PM & R appointment coming up.          ROS:   Comprehensive 10 point ROS negative unless otherwise stated in the HPI.      [facility] Components:   Medication adherence and barriers to the treatment plan have been addressed. Opportunities to optimize healthy behaviors have been discussed. Patient / caregiver voiced understanding.    Past Medical/Surgical History:     Past Medical History:   Diagnosis Date   ??? Anisocoria    ??? Cataract     Nuclear and Mild Cortical Cataracts, OU   ??? Depression    ??? Diabetes mellitus (CMS-HCC) Dx 1996    Type II   ??? Diabetic retinopathy (CMS-HCC)     PDR OU and mild DME avastin injections x6 right eye  and left eye x1,PRP 2015   ??? Dry eyes    ??? Fractures    ??? Glaucoma     History of Neovascular Glaucoma OD, indeterminate stage   ??? Hypertension    ??? PONV (postoperative nausea and vomiting)    ??? Shoulder injury    ??? Tuberculosis 1978    completed one year of treatment.      Past Surgical History:   Procedure Laterality Date   ??? ANKLE FRACTURE SURGERY     ??? CATARACT EXTRACTION EXTRACAPSULAR W/ INTRAOCULAR LENS IMPLANTATION Left 10/10/2019   ??? HAND SURGERY     ??? PR AQUEOUS SHUNT EXTRAOC EQUAT PLATE RSVR W/GRAFT Right 10/12/2013    Procedure: AQUEOUS SHUNT-EXTRAOCULAR RESERVOIR;  Surgeon: Leda Roys, MD;  [facility];  Service: Ophthalmology   ??? PR COLONOSCOPY W/BIOPSY SINGLE/MULTIPLE Left 11/16/2012    Procedure: COLONOSCOPY, FLEXIBLE, PROXIMAL TO SPLENIC FLEXURE; WITH BIOPSY, SINGLE OR MULTIPLE;  Surgeon: Lauretta Grill, MD;  Location: [facility] PROCEDURES [facility];  Service: Gastroenterology   ??? PR DRAIN ANT CHMBR,REMV BLOOD Right 10/12/2013    Procedure: PARACENTESIS (SEPART PROC); W/REMOV BLD;  Surgeon: Leda Roys, MD;  Location: MAIN OR [facility];  Service: Ophthalmology   ??? PR EYE SURG POST SGMT PROC UNLISTED Left 08/10/2016    PPV/EL/AIR left eye for VH   ??? PR OPEN RX STERNUM FRACTURE N/A 03/01/2018    Procedure: OPEN TX STERNUM FX W/WO SKELETAL FIXA;  Surgeon: Cherie Dark, MD;  Location: MAIN OR [facility];  Service: Thoracic   ??? PR REINFORCE SCLERA EYE  W GRAFT Right 10/12/2013    Procedure: SCLERAL REINFORCEMENT (SEPART PROC); W/GFT;  Surgeon: Leda Roys, MD;  Location: MAIN OR [facility];  Service: Ophthalmology   ??? PR UPPER GI ENDOSCOPY,BIOPSY N/A 11/16/2012    Procedure: UGI ENDOSCOPY; WITH BIOPSY, SINGLE OR MULTIPLE;  Surgeon: Lauretta Grill, MD;  Location: [facility];  Service: Gastroenterology   ??? PR UPPER GI ENDOSCOPY,BIOPSY N/A 05/08/2013    Procedure: UGI ENDOSCOPY; WITH BIOPSY, SINGLE OR MULTIPLE;  Surgeon: Clint Bolder, MD;  [facility];  Service: Gastroenterology   ??? PR VITRECTOMY,PANRETINAL LASER RX Left 09/24/2014    Procedure: VITRECTOMY, MECHANICAL, PARS PLANA APPROACH; WITH ENDOLASER PANRETINAL PHOTOCOAGULATION;  Surgeon: Rockwell Alexandria, MD;  Location[facility]  Service: Ophthalmology   ??? PR VITRECTOMY,PANRETINAL LASER RX Left 08/10/2016    Procedure: VITRECTOMY, MECHANICAL, PARS PLANA APPROACH; WITH ENDOLASER PANRETINAL PHOTOCOAGULATION;  Surgeon: Nicholaus Corolla, MD;  Location: [facility] OR [facility];  Service: Ophthalmology   ??? PR VITRECTOMY,PANRETINAL LASER RX Right 06/26/2019    Procedure: VITRECTOMY, MECHANICAL, PARS PLANA APPROACH; WITH ENDOLASER PANRETINAL PHOTOCOAGULATION;  Surgeon: Nicholaus Corolla, MD;  Location: [facility] OR [facility];  Service: Ophthalmology   ??? PR XCAPSL CTRC RMVL INSJ IO LENS PROSTH W/O ECP Right 09/26/2019    Procedure: EXTRACAPSULAR CATARACT REMOVAL W/INSERTION OF INTRAOCULAR LENS PROSTHESIS, MANUAL OR MECHANICAL TECHNIQUE WITHOUT ENDOSCOPIC CYCLOPHOTOCOAGULATION;  Surgeon: Arville Care, MD;  Location: [facility] OR [facility];  Service: Ophthalmology   ??? PR XCAPSL CTRC RMVL INSJ IO LENS PROSTH W/O ECP Left 10/10/2019    Procedure: EXTRACAPSULAR CATARACT REMOVAL W/INSERTION OF INTRAOCULAR LENS PROSTHESIS, MANUAL OR MECHANICAL TECHNIQUE WITHOUT ENDOSCOPIC CYCLOPHOTOCOAGULATION;  Surgeon: Arville Care, MD;  [facility]: [facility] OR [facility];  Service: Ophthalmology   ??? SHOULDER SURGERY  2016    right   ??? TUBAL LIGATION     ??? YAG CAPSULOTOMY - OU - BOTH EYES Bilateral 11/08/2019       Family History:     Family History   Problem Relation Age of Onset   ??? No Known Problems Mother    ??? No Known Problems Father    ??? Diabetes Sister    ??? No Known Problems Daughter    ??? No Known Problems Daughter    ??? Diabetes Brother    ??? Diabetes Grandchild         Type 2   ??? No Known Problems Maternal Aunt    ??? No Known Problems Maternal Uncle    ??? No Known Problems Paternal Aunt    ??? No Known Problems Paternal Uncle    ??? No Known Problems Maternal Grandmother    ??? No Known Problems Maternal Grandfather    ??? No Known Problems Paternal Grandmother    ??? Psoriasis Paternal Grandfather    ??? No Known Problems Other    ??? Clotting disorder Neg Hx    ??? Anesthesia problems Neg Hx    ??? Thyroid disease Neg Hx    ??? Amblyopia Neg Hx    ??? Blindness Neg Hx    ??? Cancer Neg Hx    ??? Cataracts Neg Hx    ??? Glaucoma Neg Hx    ??? Hypertension Neg Hx    ??? Macular degeneration Neg Hx    ??? Retinal detachment Neg Hx    ??? Strabismus Neg Hx    ??? Stroke Neg Hx    ??? Melanoma Neg Hx    ??? Basal cell carcinoma  Neg Hx    ??? Squamous cell carcinoma Neg Hx    ??? Substance Abuse Disorder Neg Hx    ??? Mental illness Neg Hx        Social History:     Social History     Tobacco Use   ??? Smoking status: Never   ??? Smokeless tobacco: Never   Vaping Use   ??? Vaping Use: Never used   Substance Use Topics   ??? Alcohol use: No   ??? Drug use: No       Allergies:   Patient has no known allergies.    Current Medications:     Current Outpatient Medications   Medication Sig Dispense Refill   ??? alcohol swabs PadM Test daily before all meals/snacks and once before bedtime. 400 each 1   ??? aspirin (ECOTRIN) 81 MG tablet Take 1 tablet (81 mg total) by mouth daily. 150 tablet 2   ??? atorvastatin (LIPITOR) 40 MG tablet Take 1 tablet (40 mg total) by mouth daily. 90 tablet 3   ??? BD INSULIN SYRINGE ULT-FINE II 1 mL 31 gauge x 5/16 Syrg 1 Package by Miscellaneous route Two (2) times a day. 100 each 3   ??? blood sugar diagnostic (ONETOUCH ULTRA BLUE TEST STRIP) Strp USE TO CHECK BLOOD SUGAR UP TO 10 TIMES DAILY AS NEEDED 600 strip 1   ??? clindamycin (CLEOCIN T) 1 % lotion Apply topically every morning. 60 mL 5   ??? clindamycin (CLEOCIN T) 1 % Swab Apply 1 application topically Two (2) times a day. 60 each 11   ??? dexamethasone (DECADRON) 0.1 % ophthalmic solution Administer 4 drops into the left ear Two (2) times a day for 30 days 5 mL 0   ??? dextran 70-hypromellose, PF, (TEARS NATURAL FREE) 0.1-0.3 % Dpet      ??? dextromethorphan (DELSYM) 30 mg/5 mL liquid Take 10 mL (60 mg total) by mouth Two (2) times a day. As needed for cough. 150 mL 0   ??? dorzolamide-timoloL (COSOPT) 22.3-6.8 mg/mL ophthalmic solution Administer 1 drop to both eyes Two (2) times a day. 10 mL 12   ??? empty container Misc Use as directed to dispose of Humira pens. 1 each 2   ??? folic acid/multivit-min/lutein (CENTRUM SILVER ORAL) Take 1 tablet by mouth daily at 0600.      ??? ibuprofen (MOTRIN) 600 MG tablet Take 1 tablet (600 mg total) by mouth every six (6) hours as needed for pain. 30 tablet 2   ??? insulin aspart protamine-insulin aspart (NOVOLOG 70/30) 100 unit/mL (70-30) injection pen Inject 0.45 mL (45 Units total) under the skin two (2) times a day. 30 mL 11   ??? metFORMIN (GLUCOPHAGE) 1000 MG tablet Take 1 tablet (1,000 mg total) by mouth 2 (two) times a day with meals. 180 tablet 3   ??? omeprazole (PRILOSEC) 20 MG capsule Take 1 capsule (20 mg total) by mouth daily. 90 capsule 3   ??? pen needle, diabetic (ULTICARE PEN NEEDLE) 32 gauge x 5/32 (4 mm) Ndle Use as directed two (2) times a day 100 each 12   ??? traZODone (DESYREL) 50 MG tablet Take 1 tablet (50 mg total) by mouth nightly. 90 tablet 3   ??? amLODIPine (NORVASC) 5 MG tablet Take 1 tablet (5 mg total) by mouth daily. 90 tablet 1   ??? blood-glucose meter kit Use as instructed 1 each 0   ??? cetirizine (ZYRTEC) 10 MG tablet Take 1 tablet (10 mg total) by mouth  daily. 30 tablet 2   ??? empagliflozin (JARDIANCE) 25 mg tablet Take 1 tablet (25 mg total) by mouth daily. 90 tablet 3   ??? fluticasone propionate (FLONASE) 50 mcg/actuation nasal spray 1 spray into each nostril two (2) times a day. (Patient not taking: Reported on 05/31/2021) 48 g 1   ??? HUMIRA PEN CITRATE FREE 40 MG/0.4 ML Inject 0.4 mL (40 mg total) under the skin every seven (7) days. (Patient not taking: Reported on 05/31/2021) 4 each 11   ??? HUMIRA PEN CITRATE FREE STARTER PACK FOR CROHN'S/UC/HS 3 X 80 MG/0.8 ML Inject the contents of 2 pens (160 mg) under the skin on day 1, THEN 1 pen (80 mg) on day 15. (Patient not taking: Reported on 05/31/2021) 3 each 0   ??? lancing device Misc USE 4 TIMES DAILY BEFORE MEALS AND NIGHTLY ( USAR CUATRO VECES AL DIA SPBM Y TODAS LAS NOCHES ) 1 each 1   ??? losartan (COZAAR) 25 MG tablet Take 1 tablet (25 mg total) by mouth daily. 90 tablet 3   ??? semaglutide 7 mg Tab Take 7 mg by mouth daily. 30 tablet 2     No current facility-administered medications for this visit.     Facility-Administered Medications Ordered in Other Visits   Medication Dose Route Frequency Provider Last Rate Last Admin   ??? matrix hemostatic sealant (FLOSEAL) topical            ??? matrix hemostatic sealant (FLOSEAL) topical                Health Maintenance:     Health Maintenance   Topic Date Due   ??? Zoster Vaccines (1 of 2) Never done   ??? COVID-19 Vaccine (4 - Booster for Pfizer series) 07/03/2021   ??? Hemoglobin A1c  08/31/2021   ??? Urine Albumin/Creatinine Ratio  11/03/2021   ??? Foot Exam  01/29/2022   ??? Serum Creatinine Monitoring  01/29/2022   ??? Potassium Monitoring  01/29/2022   ??? Retinal Eye Exam  05/13/2022   ??? Mammogram Start Age 50  07/03/2022   ??? Colonoscopy  11/17/2022   ??? DTaP/Tdap/Td Vaccines (2 - Td or Tdap) 06/24/2024   ??? HPV Cotest with Pap Smear (21-65)  11/06/2024   ??? Pap Smear with Cotest HPV (21-65)  11/14/2024   ??? Pneumococcal Vaccine 0-64  Completed   ??? Hepatitis C Screen  Completed   ??? Influenza Vaccine  Completed       Immunizations:     Immunization History   Administered Date(s) Administered   ??? COVID-19 VAC,BIVALENT(5-62YR)BOOST,PFIZER 05/08/2021   ??? COVID-19 VACC,MRNA,(PFIZER)(PF) 07/01/2019, 07/22/2019, 05/08/2020   ??? INFLUENZA INJ MDCK PF, QUAD,(FLUCELVAX)(32MO AND UP EGG FREE) 01/20/2020, 01/29/2021   ??? Influenza Vaccine Quad (IIV4 PF) 27mo+ injectable 05/23/2017, 11/29/2017   ??? PNEUMOCOCCAL POLYSACCHARIDE 23 06/25/2014   ??? TdaP 06/25/2014     I have reviewed and (if needed) updated the patient's problem list, medications, allergies, past medical and surgical history, social and family history.     Vital Signs:     Wt Readings from Last 3 Encounters:   05/31/21 91.2 kg (201 lb)   04/30/21 92.4 kg (203 lb 11.2 oz)   02/24/21 90.7 kg (200 lb)     Temp Readings from Last 3 Encounters:   05/31/21 35.9 ??C (96.6 ??F) (Temporal)   01/29/21 36.6 ??C (97.9 ??F) (Oral)   11/03/20 36.8 ??C (98.2 ??F) (Oral)     BP Readings from Last 3 Encounters:  05/31/21 152/91   04/30/21 133/61   02/24/21 162/76     Pulse Readings from Last 3 Encounters:   05/31/21 74   04/30/21 85   02/24/21 88     Estimated body mass index is 36.76 kg/m?? as calculated from the following:    Height as of 02/24/21: 157.5 cm (5' 2).    Weight as of this encounter: 91.2 kg (201 lb).  Facility age limit for growth percentiles is 20 years.        Objective:      General: Alert and oriented x3. Well-appearing. No acute distress.   HEENT:  Normocephalic.  Atraumatic. Conjunctiva and sclera normal. OP MMM without lesions.   Neck:  Supple. No thyroid enlargement. No adenopathy.   Heart:  Regular rate and rhythm. Normal S1, S2. No murmurs, rubs or gallops.   Lungs:  No respiratory distress.  Lungs clear to auscultation. No wheezes, rhonchi, or rales.   GI/GU:  Soft, +BS, nondistended, non-TTP. No palpable masses or organomegaly.  Extremities:  No edema. Peripheral pulses normal.  Skin:  Warm, dry. No rash or lesions present.   Neuro:  Non-focal. No obvious weakness.   Psych:  Affect normal, eye contact good, speech clear and coherent.         I attest that I, Vergia Alberts, personally documented this note while acting as scribe for Deneise Lever, FNP.      Vergia Alberts, Scribe.  05/31/2021     The documentation recorded by the scribe accurately reflects the service I personally performed and the decisions made by me.    Deneise Lever, FNP

## 2021-06-02 DIAGNOSIS — E11319 Type 2 diabetes mellitus with unspecified diabetic retinopathy without macular edema: Principal | ICD-10-CM
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
## 2021-06-08 DIAGNOSIS — E113513 Type 2 diabetes mellitus with proliferative diabetic retinopathy with macular edema, bilateral: Principal | ICD-10-CM

## 2021-06-08 DIAGNOSIS — E11319 Type 2 diabetes mellitus with unspecified diabetic retinopathy without macular edema: Principal | ICD-10-CM

## 2021-06-08 DIAGNOSIS — Z794 Long term (current) use of insulin: Principal | ICD-10-CM

## 2021-06-08 MED ORDER — FREESTYLE LIBRE 2 SENSOR KIT
11 refills | 0 days | Status: CP
Start: 2021-06-08 — End: ?

## 2021-06-08 NOTE — Unmapped (Signed)
Discussed w/daughter and patient-DM still in suboptimal control, has been on NPH 70-30 since 2018. Better control than in past, but still not ideal.  Pt hesitant to add Ozempic or Victoza as another injection-wonders if this will interfere with humira.    Plan: obtain libre sensor to get more information on blood sugars, Once additional information obtained, can make changes to DM regimen--change insulin and/or add GLP1.    Pt & daughter verbalized understanding, agreeable w/plan of care.

## 2021-06-09 ENCOUNTER — Ambulatory Visit: Admit: 2021-06-09 | Discharge: 2021-06-09 | Payer: PRIVATE HEALTH INSURANCE

## 2021-06-09 ENCOUNTER — Ambulatory Visit
Admit: 2021-06-09 | Discharge: 2021-06-09 | Payer: PRIVATE HEALTH INSURANCE | Attending: Student in an Organized Health Care Education/Training Program | Primary: Student in an Organized Health Care Education/Training Program

## 2021-06-09 DIAGNOSIS — Z79899 Other long term (current) drug therapy: Principal | ICD-10-CM

## 2021-06-09 DIAGNOSIS — L732 Hidradenitis suppurativa: Principal | ICD-10-CM

## 2021-06-09 MED ORDER — HUMIRA PEN CITRATE FREE 40 MG/0.4 ML
SUBCUTANEOUS | 11 refills | 28.00000 days | Status: CP
Start: 2021-06-09 — End: 2022-06-09
  Filled 2021-06-16: qty 4, 28d supply, fill #0

## 2021-06-09 MED ORDER — CLINDAMYCIN PHOSPHATE 1 % TOPICAL SWAB
Freq: Two times a day (BID) | TOPICAL | 11 refills | 30 days | Status: CP
Start: 2021-06-09 — End: ?
  Filled 2021-06-16: qty 60, 30d supply, fill #0

## 2021-06-09 NOTE — Unmapped (Signed)
June 09, 2021 8:50 AM. Documentation assistance provided by the Scribe. I was present during the time the encounter was recorded. The information recorded by the Scribe was done at my direction and has been reviewed and validated by me.

## 2021-06-09 NOTE — Unmapped (Signed)
Message from patient's daughter stating that a PA is needed for Humira.     Per referral notes  - PA was submitted by the [facility] MAP team via [facility] (Key: [id number]).  PA was denied as a TB test is needed.      [facility] Referral: MAP Update    Update: Patient has follow up Dermatology appointment on 03/15. Provider has stated he will have required tests completed at that time.  Reopening referral for PA to be resubmitted once test results have populate for them to be submitted with PA request.

## 2021-06-10 LAB — QUANTIFERON TB GOLD PLUS
QUANTIFERON ANTIGEN 1 MINUS NIL: -0.01 [IU]/mL
QUANTIFERON ANTIGEN 2 MINUS NIL: 0.01 [IU]/mL
QUANTIFERON MITOGEN: 8.2 [IU]/mL
QUANTIFERON TB GOLD PLUS: NEGATIVE
QUANTIFERON TB NIL VALUE: 0.02 [IU]/mL

## 2021-06-10 LAB — TB AG2: TB AG2 VALUE: 0.03

## 2021-06-10 LAB — TB AG1: TB AG1 VALUE: 0.01

## 2021-06-10 LAB — TB MITOGEN: TB MITOGEN VALUE: 8.22

## 2021-06-10 LAB — TB NIL: TB NIL VALUE: 0.02

## 2021-06-11 LAB — HEPATITIS B CORE ANTIBODY, TOTAL: HEPATITIS B CORE TOTAL ANTIBODY: NONREACTIVE

## 2021-06-11 LAB — HEPATITIS B SURFACE ANTIBODY
HEPATITIS B SURFACE ANTIBODY QUANT: <8 m[IU]/mL (ref <8.00)
HEPATITIS B SURFACE ANTIBODY: NONREACTIVE

## 2021-06-11 LAB — HEPATITIS B SURFACE ANTIGEN: HEPATITIS B SURFACE ANTIGEN: NONREACTIVE

## 2021-06-15 NOTE — Unmapped (Signed)
[facility] Refill Coordination Note    Specialty Medication(s) to be Shipped:   Inflammatory Disorders: Humira    Other medication(s) to be shipped: Clindamycin swab, Novolog, Ulticare Pen Needles, Dorzolamide-timoloL 22.3-6.8 mg/mL ophthalmic solution (COSOPT), Omeprazole, Atorvastatin and  Flonase     Anne Barber, DOB: 1960-11-02  Phone: [phone] (home)       All above HIPAA information was verified with patient's family member, daughter.     Was a Nurse, learning disability used for this call? No    Completed refill call assessment today to schedule patient's medication shipment from the [facility] [phone]).  All relevant notes have been reviewed.     Specialty medication(s) and dose(s) confirmed: Regimen is correct and unchanged.   Changes to medications: Tomika reports no changes at this time.  Changes to insurance: No  New side effects reported not previously addressed with a pharmacist or physician: None reported  Questions for the pharmacist: No    Confirmed patient received a Conservation officer, historic buildings and a Surveyor, mining with first shipment. The patient will receive a drug information handout for each medication shipped and additional FDA Medication Guides as required.       DISEASE/MEDICATION-SPECIFIC INFORMATION        For patients on injectable medications: Patient currently has 0 doses left.  Next injection is scheduled for asap (patient missed 2 doses on 3/6 and 3/13 due to awaiting insurance approval).    SPECIALTY MEDICATION ADHERENCE     Medication Adherence    Patient reported X missed doses in the last month: 2  Specialty Medication: Humira-missed 2 doses while awaiting insurance approval  Patient is on additional specialty medications: No  Any gaps in refill history greater than 2 weeks in the last 3 months: no  Demonstrates understanding of importance of adherence: yes  Informant: child/children  Reliability of informant: reliable  Confirmed plan for next specialty medication refill: delivery by pharmacy  Refills needed for supportive medications: not needed              Were doses missed due to medication being on hold? No    Humira 40mg /0.83ml: Patient has 0 days of medication on hand     REFERRAL TO PHARMACIST     Referral to the pharmacist: Not needed      [facility]     Shipping address confirmed in Epic.     Delivery Scheduled: Yes, Expected medication delivery date: 06/16/2021.     Medication will be delivered via Same Day Courier to the prescription address in Epic WAM.    Kortnie Stovall D Tatiana Courter   [facility] Specialty Technician

## 2021-06-16 MED FILL — FLUTICASONE PROPIONATE 50 MCG/ACTUATION NASAL SPRAY,SUSPENSION: NASAL | 30 days supply | Qty: 16 | Fill #1

## 2021-06-16 MED FILL — OMEPRAZOLE 20 MG CAPSULE,DELAYED RELEASE: ORAL | 90 days supply | Qty: 90 | Fill #1

## 2021-06-16 MED FILL — NOVOLOG MIX 70-30 FLEXPEN U-100 INSULIN 100 UNIT/ML SUBCUTANEOUS PEN: SUBCUTANEOUS | 33 days supply | Qty: 30 | Fill #7

## 2021-06-16 MED FILL — ULTICARE PEN NEEDLE 32 GAUGE X 5/32" (4 MM): SUBCUTANEOUS | 50 days supply | Qty: 100 | Fill #6

## 2021-06-16 MED FILL — DORZOLAMIDE 22.3 MG-TIMOLOL 6.8 MG/ML EYE DROPS: OPHTHALMIC | 50 days supply | Qty: 10 | Fill #2

## 2021-06-16 MED FILL — ATORVASTATIN 40 MG TABLET: ORAL | 90 days supply | Qty: 90 | Fill #2

## 2021-06-17 ENCOUNTER — Other Ambulatory Visit: Payer: Self-pay

## 2021-06-17 ENCOUNTER — Emergency Department: Payer: Medicaid Other

## 2021-06-17 ENCOUNTER — Observation Stay
Admission: EM | Admit: 2021-06-17 | Discharge: 2021-06-19 | Disposition: A | Discharge status: Home Health | Payer: Medicaid Other | Attending: Internal Medicine | Admitting: Internal Medicine

## 2021-06-17 ENCOUNTER — Ambulatory Visit
Admit: 2021-06-17 | Discharge: 2021-06-18 | Payer: PRIVATE HEALTH INSURANCE | Attending: Clinical Neuropsychologist | Primary: Clinical Neuropsychologist

## 2021-06-17 DIAGNOSIS — R0602 Shortness of breath: Secondary | ICD-10-CM | POA: Insufficient documentation

## 2021-06-17 DIAGNOSIS — E1169 Type 2 diabetes mellitus with other specified complication: Secondary | ICD-10-CM | POA: Insufficient documentation

## 2021-06-17 DIAGNOSIS — Z794 Long term (current) use of insulin: Secondary | ICD-10-CM | POA: Diagnosis not present

## 2021-06-17 DIAGNOSIS — R1084 Generalized abdominal pain: Secondary | ICD-10-CM | POA: Insufficient documentation

## 2021-06-17 DIAGNOSIS — R531 Weakness: Secondary | ICD-10-CM | POA: Insufficient documentation

## 2021-06-17 DIAGNOSIS — M542 Cervicalgia: Secondary | ICD-10-CM | POA: Insufficient documentation

## 2021-06-17 DIAGNOSIS — E11319 Type 2 diabetes mellitus with unspecified diabetic retinopathy without macular edema: Secondary | ICD-10-CM | POA: Insufficient documentation

## 2021-06-17 DIAGNOSIS — Z20822 Contact with and (suspected) exposure to covid-19: Secondary | ICD-10-CM | POA: Insufficient documentation

## 2021-06-17 DIAGNOSIS — M79601 Pain in right arm: Secondary | ICD-10-CM

## 2021-06-17 DIAGNOSIS — E782 Mixed hyperlipidemia: Secondary | ICD-10-CM | POA: Insufficient documentation

## 2021-06-17 DIAGNOSIS — R700 Elevated erythrocyte sedimentation rate: Secondary | ICD-10-CM | POA: Insufficient documentation

## 2021-06-17 DIAGNOSIS — M7989 Other specified soft tissue disorders: Secondary | ICD-10-CM | POA: Insufficient documentation

## 2021-06-17 DIAGNOSIS — Z79899 Other long term (current) drug therapy: Secondary | ICD-10-CM | POA: Insufficient documentation

## 2021-06-17 DIAGNOSIS — I10 Essential (primary) hypertension: Secondary | ICD-10-CM | POA: Insufficient documentation

## 2021-06-17 DIAGNOSIS — M25512 Pain in left shoulder: Secondary | ICD-10-CM | POA: Diagnosis not present

## 2021-06-17 DIAGNOSIS — Z7982 Long term (current) use of aspirin: Secondary | ICD-10-CM | POA: Insufficient documentation

## 2021-06-17 DIAGNOSIS — Z7984 Long term (current) use of oral hypoglycemic drugs: Secondary | ICD-10-CM | POA: Insufficient documentation

## 2021-06-17 DIAGNOSIS — K219 Gastro-esophageal reflux disease without esophagitis: Secondary | ICD-10-CM | POA: Diagnosis present

## 2021-06-17 DIAGNOSIS — R079 Chest pain, unspecified: Secondary | ICD-10-CM | POA: Diagnosis not present

## 2021-06-17 DIAGNOSIS — M2550 Pain in unspecified joint: Secondary | ICD-10-CM | POA: Insufficient documentation

## 2021-06-17 DIAGNOSIS — M25511 Pain in right shoulder: Secondary | ICD-10-CM | POA: Diagnosis present

## 2021-06-17 IMAGING — CR DG CHEST 2V
2 series · 3 of 3 positions shown · non-contrast
Comparison: [DATE]

CLINICAL DATA: Chest pain

EXAM:
CHEST - 2 VIEW

[Series 2: chest lat · 0.14mm/px · 2 of 2 slices shown]
[im 1/2]
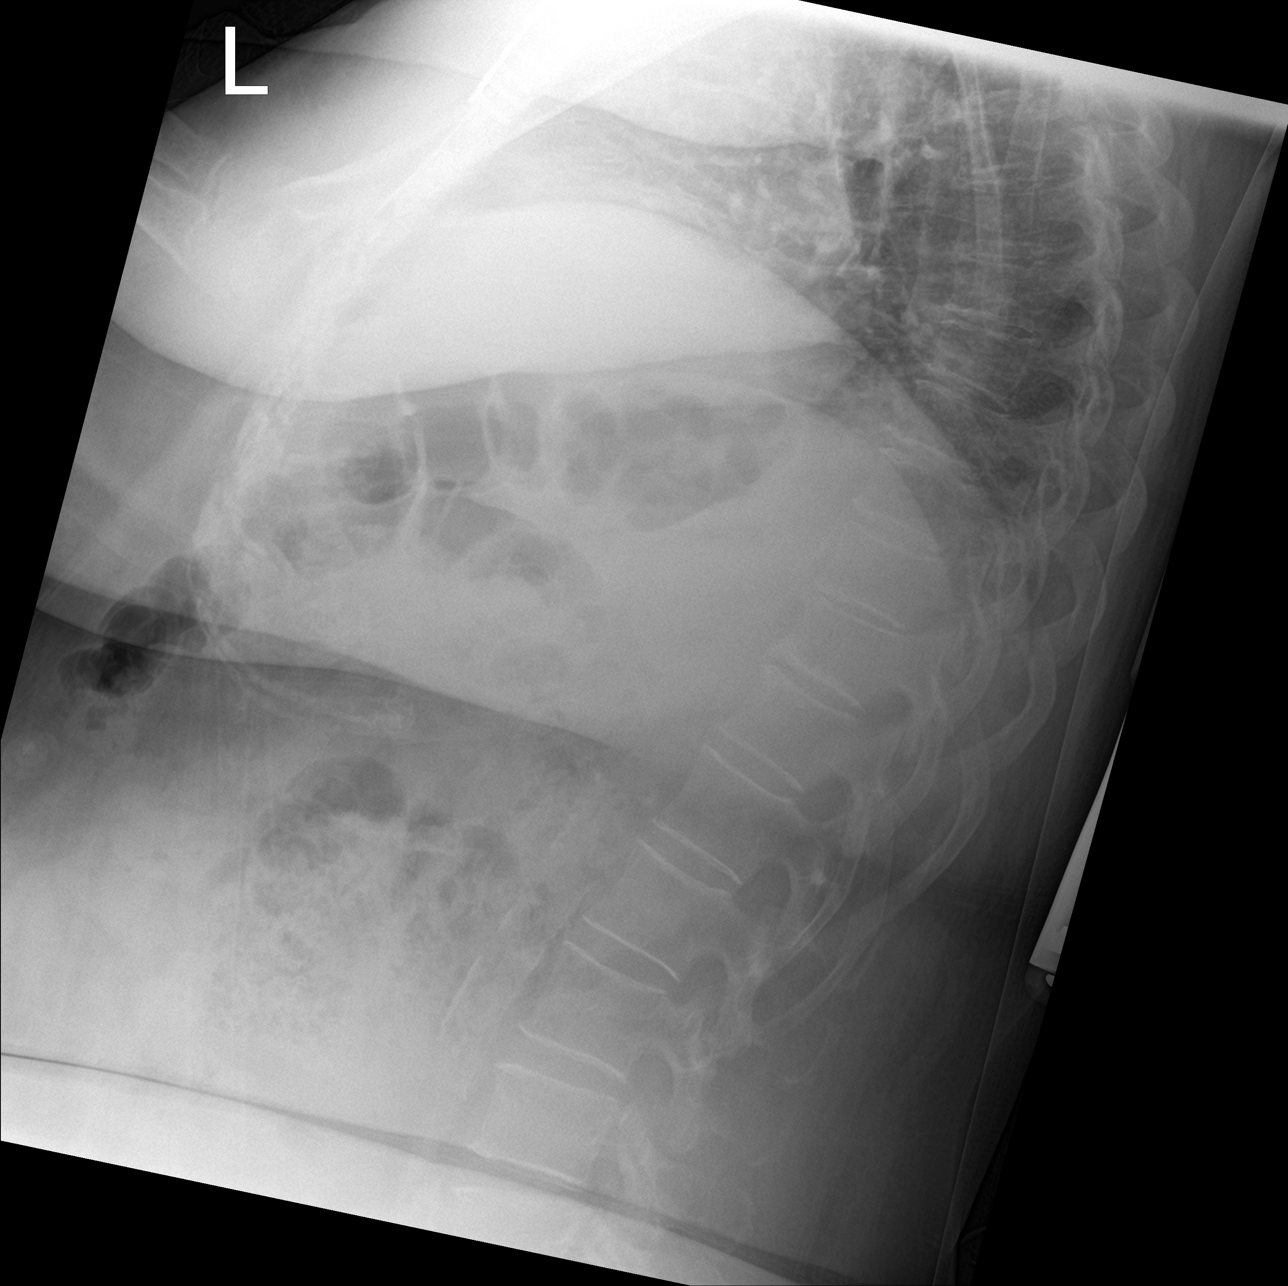
[im 2/2]
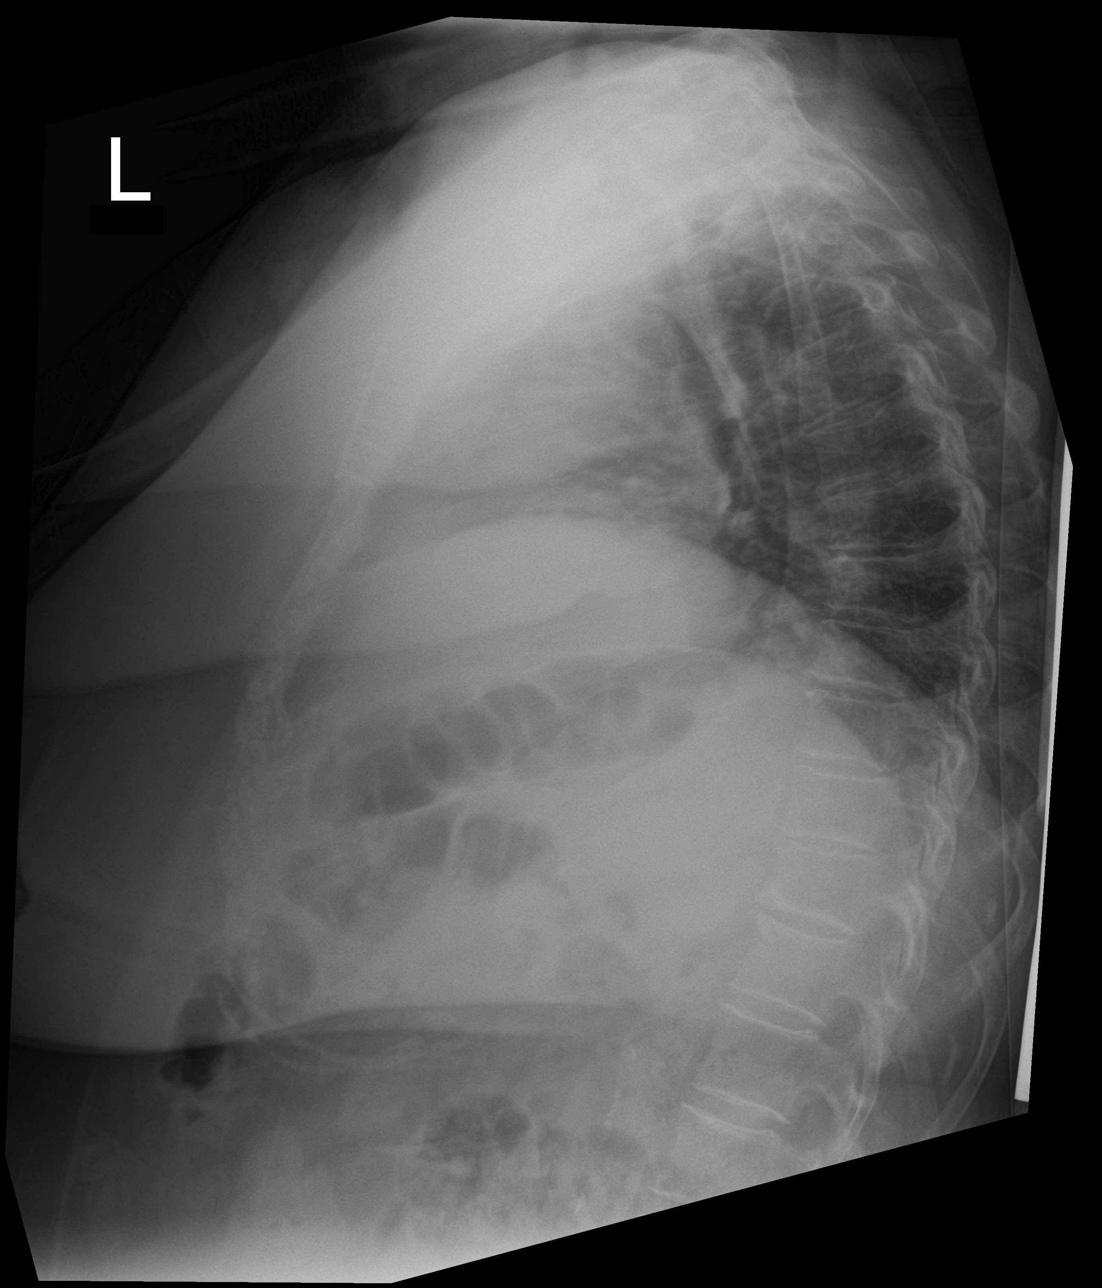

[chest ap]
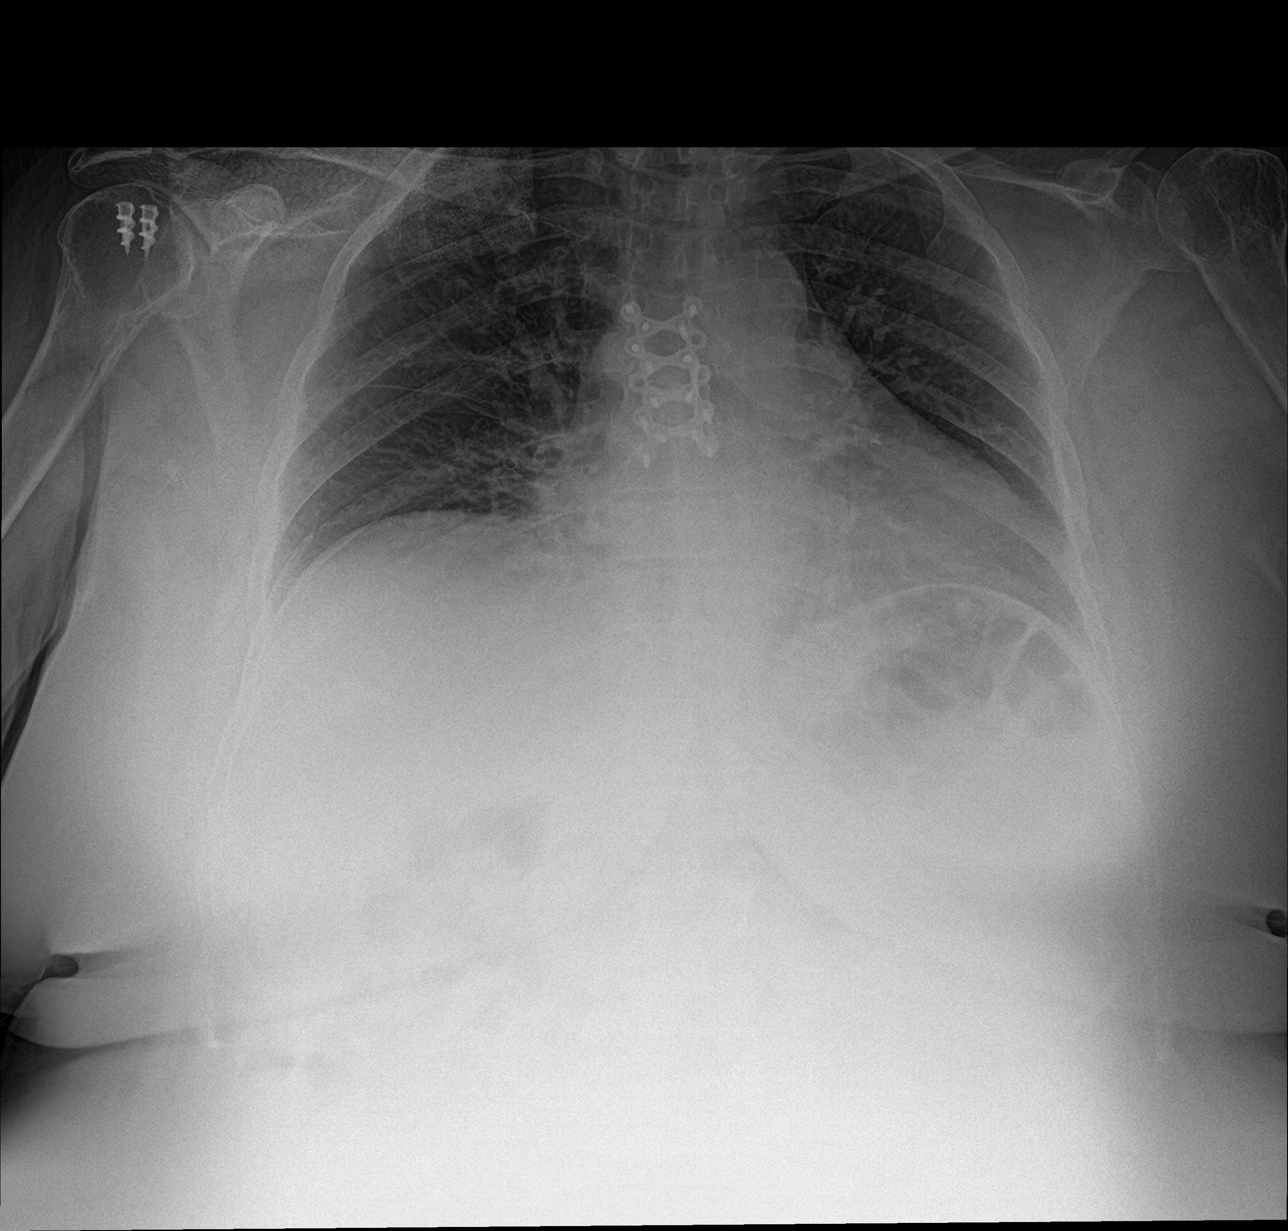

[3 of 3 positions shown; findings below may reference images not displayed]

FINDINGS: Cardiac shadow is mildly enlarged. Postsurgical changes are seen in
the right shoulder and sternum. Overall inspiratory effort is poor.
No focal infiltrate or effusion is seen. Degenerative changes of the
thoracic spine are noted.
IMPRESSION: No active cardiopulmonary disease.

## 2021-06-17 MED ORDER — LORAZEPAM 2 MG/ML IJ SOLN
0.5000 mg | Freq: Once | INTRAMUSCULAR | Status: AC
  Administered 2021-06-18: 0.5 mg via INTRAVENOUS
  Filled 2021-06-17: qty 1

## 2021-06-17 MED ORDER — ONDANSETRON HCL 4 MG/2ML IJ SOLN
4.0000 mg | Freq: Once | INTRAMUSCULAR | Status: AC
  Administered 2021-06-18: 4 mg via INTRAVENOUS
  Filled 2021-06-17: qty 2

## 2021-06-17 MED ORDER — MORPHINE SULFATE (PF) 4 MG/ML IV SOLN
4.0000 mg | Freq: Once | INTRAVENOUS | Status: AC
  Administered 2021-06-18: 4 mg via INTRAVENOUS
  Filled 2021-06-17: qty 1

## 2021-06-17 NOTE — ED Provider Notes (Signed)
? ?[facility] ?Provider Note ? ? ? Event Date/Time  ? First MD Initiated Contact with Patient 06/17/21 2313   ?  (approximate) ? ? ?History  ? ?Chest Pain ? ? ?HPI ? ?Donna Crane is a 61 y.o. female with diabetes and hypertension who was to be taking Humira for her adenitis suppurativa of the groin.  She began having pain in her knee on Monday and then subsequently Tuesday and Wednesday developed pain in the arms shoulders back of the neck upper chest and the bones and basically everywhere now.  She reports her arms are heavy and she cannot lift them she cannot manipulate her fingers well enough to clean herself after she urinates.  Touching anywhere on her body hurts.  Taking a deep breath hurts mostly because her chest wall bones sternum ribs etc. hurt.  She cannot lift her arms because they are heavy and they are weak and her shoulders hurt and her arms hurt in the back of her neck hurts.  She does not appear to be running a fever or having a cough or anything but this pain is rapidly spread throughout her whole body. ? ?  ? ? ?Physical Exam  ? ?Triage Vital Signs: ?ED Triage Vitals  ?Enc Vitals Group  ?   BP 06/17/21 2255 (!) 146/82  ?   Pulse Rate 06/17/21 2255 88  ?   Resp 06/17/21 2255 18  ?   Temp 06/17/21 2255 98.9 ?F (37.2 ?C)  ?   Temp Source 06/17/21 2255 Oral  ?   SpO2 06/17/21 2255 99 %  ?   Weight 06/17/21 2252 200 lb 9.9 oz (91 kg)  ?   Height 06/17/21 2252 5\' 2"  (1.575 m)  ?   Head Circumference --   ?   Peak Flow --   ?   Pain Score 06/17/21 2252 10  ?   Pain Loc --   ?   Pain Edu? --   ?   Excl. in GC? --   ? ? ?Most recent vital signs: ?Vitals:  ? 06/18/21 0236 06/18/21 0330  ?BP: 130/61 (!) 121/55  ?Pulse: 79 74  ?Resp: 20 (!) 21  ?Temp:    ?SpO2: 100% 100%  ? ? ? ?General: Awake, alert.  Patient sitting in the chair with her hands by her sides palms up hands are swollen she is not moving much. ?Head: Normocephalic atraumatic ?Neck: Very tender posteriorly throughout the  neck especially around the C3 area ?Chest tender diffusely to palpation especially in the upper chest wall over the bones. ?CV:  Good peripheral perfusion.  Heart regular rate and rhythm no audible murmurs ?Resp:  Normal effort.  Lungs are clear patient complains of a lot of pain with ?Abd:  No distention.  ?Extremities: Diffusely puffy and tender and swollen no marked weakness but patient cannot move much because it hurts ? ? ?ED Results / Procedures / Treatments  ? ?Labs ?(all labs ordered are listed, but only abnormal results are displayed) ?Labs Reviewed  ?URINALYSIS, ROUTINE W REFLEX MICROSCOPIC - Abnormal; Notable for the following components:  ?    Result Value  ? Color, Urine STRAW (*)   ? APPearance CLEAR (*)   ? Specific Gravity, Urine 1.032 (*)   ? Glucose, UA >=500 (*)   ? Leukocytes,Ua TRACE (*)   ? All other components within normal limits  ?CBC - Abnormal; Notable for the following components:  ? Hemoglobin 11.7 (*)   ? MCH  25.6 (*)   ? All other components within normal limits  ?COMPREHENSIVE METABOLIC PANEL - Abnormal; Notable for the following components:  ? Glucose, Bld 153 (*)   ? Calcium 8.7 (*)   ? Total Protein 8.6 (*)   ? All other components within normal limits  ?D-DIMER, QUANTITATIVE (NOT AT [facility]) - Abnormal; Notable for the following components:  ? D-Dimer, Quant 0.96 (*)   ? All other components within normal limits  ?SEDIMENTATION RATE - Abnormal; Notable for the following components:  ? Sed Rate 52 (*)   ? All other components within normal limits  ?RESP PANEL BY RT-PCR (FLU A&B, COVID) [id number]  ?BRAIN NATRIURETIC PEPTIDE  ?CK  ?DIFFERENTIAL  ?TROPONIN I (HIGH SENSITIVITY)  ?TROPONIN I (HIGH SENSITIVITY)  ? ? ? ?EKG ? ?EKG read interpreted by me shows normal sinus rhythm 86 rightward axis left posterior hemiblock no other changes are seen ? ? ?RADIOLOGY ? ?Chest x-ray read by radiology films reviewed closely by myself show evidence of previous surgery on the right shoulder and sternum.   The heart is slightly enlarged.  There is some degenerative joint disease in both shoulders.  There is no obvious acute findings however. ?MRI of the cervical spine films read by radiology and films reviewed by me only shows some mild foraminal stenosis. ?PROCEDURES: ? ?Critical Care performed:  ? ?Procedures ? ? ?MEDICATIONS ORDERED IN ED: ?Medications  ?morphine (PF) 4 MG/ML injection 4 mg (4 mg Intravenous Given 06/18/21 0039)  ?ondansetron [facility](ZOFRAN) injection 4 mg (4 mg Intravenous Given 06/18/21 0037)  ?LORazepam (ATIVAN) injection 0.5 mg (0.5 mg Intravenous Given 06/18/21 0043)  ?ondansetron [facility](ZOFRAN) injection 4 mg (4 mg Intravenous Given 06/18/21 0134)  ?iohexol (OMNIPAQUE) 350 MG/ML injection 100 mL (100 mLs Intravenous Contrast Given 06/18/21 0223)  ?lactated ringers bolus 1,000 mL (1,000 mLs Intravenous New Bag/Given 06/18/21 0324)  ? ? ? ?IMPRESSION / MDM / ASSESSMENT AND PLAN / ED COURSE  ?I reviewed the triage vital signs and the nursing notes. ?Patient feeling somewhat better but still very stiff and achy all over.  She still cannot make a fist with her hands and they are still swollen.  She now read ports that she has had left lower quadrant abdominal pain for several months and asked her doctor about it but apparently was not really investigated for that.  She may have had some diverticulitis.  She still has pain in the abdomen but seems to be more diffuse than just left lower quadrant but this is less pain than she has had elsewhere so I had not done anything about that up to this point.  Since she had the CT angio I cannot give her any more dye now.  It may be useful to do a CT with IV contrast of the abdomen pelvis tomorrow.  Since the patient is not having a high white blood count and this pain is less than in the chest for example and is been going on for several months as I understand I am not as worried about that at this point. ? ?I have now looked at the patient's back where she really does not  have any evidence of her adenitis or her anterior abdomen even down onto the mons pubis I do not see anything.  The patient does say it is down below which I understand to me between her legs.  She still very uncomfortable moving so I want insist on looking there in a lot of detail.  There is  nothing on her buttocks at this point.  I did look there. ? ?The patient is on the cardiac monitor to evaluate for evidence of arrhythmia and/or significant heart rate changes. ?It seems to me the patient is having some kind of arthritic flare with the pain and swelling diffusely.  He is not running a fever blood pressures been good her sed rate is somewhat elevated as is the D-dimer but everything else really has been okay. ?  ? ? ?FINAL CLINICAL IMPRESSION(S) / ED DIAGNOSES  ? ?Final diagnoses:  ?Chest pain, unspecified type  ?Neck pain  ?Acute pain of both shoulders  ?Pain in both upper extremities  ?Bilateral hand swelling  ?Generalized abdominal pain  ?Elevated sed rate  ? ? ? ?Rx / DC Orders  ? ?ED Discharge Orders   ? ? None  ? ?  ? ? ? ?Note:  This document was prepared using Dragon voice recognition software and may include unintentional dictation errors. ?  ?Arnaldo Natal, MD ?06/18/21 [phone] ? ?

## 2021-06-17 NOTE — ED Triage Notes (Signed)
Pt presents to ER c/o chest pain, sob, and arm/leg swelling that started Monday.  Pt denies any sick contacts.  Pt states chest pain feels like a pressure on her chest.  Pt denies any prior cardiac problems.  Denies vomiting, diarrhea and dizziness.  Pt is A&O x4 at this time.  Pt appears uncomfortable in triage.   ?

## 2021-06-18 ENCOUNTER — Emergency Department: Payer: Medicaid Other

## 2021-06-18 ENCOUNTER — Encounter: Payer: Self-pay | Admitting: Internal Medicine

## 2021-06-18 ENCOUNTER — Observation Stay
Admit: 2021-06-18 | Discharge: 2021-06-18 | Disposition: A | Discharge status: Home / Self Care | Payer: Medicaid Other | Attending: Internal Medicine | Admitting: Internal Medicine

## 2021-06-18 DIAGNOSIS — Z794 Long term (current) use of insulin: Secondary | ICD-10-CM

## 2021-06-18 DIAGNOSIS — M2550 Pain in unspecified joint: Secondary | ICD-10-CM

## 2021-06-18 DIAGNOSIS — E11319 Type 2 diabetes mellitus with unspecified diabetic retinopathy without macular edema: Secondary | ICD-10-CM

## 2021-06-18 DIAGNOSIS — K219 Gastro-esophageal reflux disease without esophagitis: Secondary | ICD-10-CM

## 2021-06-18 DIAGNOSIS — E782 Mixed hyperlipidemia: Secondary | ICD-10-CM

## 2021-06-18 DIAGNOSIS — R079 Chest pain, unspecified: Secondary | ICD-10-CM

## 2021-06-18 DIAGNOSIS — E1169 Type 2 diabetes mellitus with other specified complication: Secondary | ICD-10-CM | POA: Diagnosis not present

## 2021-06-18 DIAGNOSIS — R0602 Shortness of breath: Secondary | ICD-10-CM

## 2021-06-18 LAB — COMPREHENSIVE METABOLIC PANEL
ALT: 19 U/L (ref 0–44)
ALT: 16 U/L (ref 0–44)
AST: 21 U/L (ref 15–41)
AST: 18 U/L (ref 15–41)
Albumin: 4 g/dL (ref 3.5–5.0)
Albumin: 3.4 g/dL — ABNORMAL LOW (ref 3.5–5.0)
Alkaline Phosphatase: 84 U/L (ref 38–126)
Alkaline Phosphatase: 63 U/L (ref 38–126)
Anion gap: 9 (ref 5–15)
Anion gap: 8 (ref 5–15)
BUN: 13 mg/dL (ref 8–23)
BUN: 11 mg/dL (ref 8–23)
CO2: 25 mmol/L (ref 22–32)
CO2: 24 mmol/L (ref 22–32)
Calcium: 8.7 mg/dL — ABNORMAL LOW (ref 8.9–10.3)
Calcium: 8.3 mg/dL — ABNORMAL LOW (ref 8.9–10.3)
Chloride: 102 mmol/L (ref 98–111)
Chloride: 105 mmol/L (ref 98–111)
Creatinine, Ser: 0.68 mg/dL (ref 0.44–1.00)
Creatinine, Ser: 0.68 mg/dL (ref 0.44–1.00)
GFR, Estimated: >60 mL/min (ref >60)
GFR, Estimated: >60 mL/min (ref >60)
Glucose, Bld: 153 mg/dL — ABNORMAL HIGH (ref 70–99)
Glucose, Bld: 118 mg/dL — ABNORMAL HIGH (ref 70–99)
Potassium: 3.7 mmol/L (ref 3.5–5.1)
Potassium: 3.7 mmol/L (ref 3.5–5.1)
Sodium: 136 mmol/L (ref 135–145)
Sodium: 137 mmol/L (ref 135–145)
Total Bilirubin: 0.5 mg/dL (ref 0.3–1.2)
Total Bilirubin: 0.7 mg/dL (ref 0.3–1.2)
Total Protein: 8.6 g/dL — ABNORMAL HIGH (ref 6.5–8.1)
Total Protein: 7.1 g/dL (ref 6.5–8.1)

## 2021-06-18 LAB — CBC
HCT: 37.6 % (ref 36.0–46.0)
Hemoglobin: 11.7 g/dL — ABNORMAL LOW (ref 12.0–15.0)
MCH: 25.6 pg — ABNORMAL LOW (ref 26.0–34.0)
MCHC: 31.1 g/dL (ref 30.0–36.0)
MCV: 82.3 fL (ref 80.0–100.0)
Platelets: 245 10*3/uL (ref 150–400)
RBC: 4.57 MIL/uL (ref 3.87–5.11)
RDW: 15 % (ref 11.5–15.5)
WBC: 7.8 10*3/uL (ref 4.0–10.5)
nRBC: 0 % (ref 0.0–0.2)

## 2021-06-18 LAB — MAGNESIUM: Magnesium: 2.1 mg/dL (ref 1.7–2.4)

## 2021-06-18 LAB — RESP PANEL BY RT-PCR (FLU A&B, COVID) ARPGX2
Influenza A by PCR: NEGATIVE
Influenza B by PCR: NEGATIVE
SARS Coronavirus 2 by RT PCR: NEGATIVE

## 2021-06-18 LAB — URINALYSIS, ROUTINE W REFLEX MICROSCOPIC
Bacteria, UA: NONE SEEN
Bilirubin Urine: NEGATIVE
Glucose, UA: >=500 mg/dL — AB
Hgb urine dipstick: NEGATIVE
Ketones, ur: NEGATIVE mg/dL
Nitrite: NEGATIVE
Protein, ur: NEGATIVE mg/dL
Specific Gravity, Urine: 1.032 — ABNORMAL HIGH (ref 1.005–1.030)
pH: 5 (ref 5.0–8.0)

## 2021-06-18 LAB — CBC WITH DIFFERENTIAL/PLATELET
Abs Immature Granulocytes: 0.02 10*3/uL (ref 0.00–0.07)
Basophils Absolute: 0 10*3/uL (ref 0.0–0.1)
Basophils Relative: 0 %
Eosinophils Absolute: 0.1 10*3/uL (ref 0.0–0.5)
Eosinophils Relative: 1 %
HCT: 33.4 % — ABNORMAL LOW (ref 36.0–46.0)
Hemoglobin: 10.6 g/dL — ABNORMAL LOW (ref 12.0–15.0)
Immature Granulocytes: 0 %
Lymphocytes Relative: 20 %
Lymphs Abs: 1.2 10*3/uL (ref 0.7–4.0)
MCH: 26.1 pg (ref 26.0–34.0)
MCHC: 31.7 g/dL (ref 30.0–36.0)
MCV: 82.3 fL (ref 80.0–100.0)
Monocytes Absolute: 0.4 10*3/uL (ref 0.1–1.0)
Monocytes Relative: 8 %
Neutro Abs: 4.1 10*3/uL (ref 1.7–7.7)
Neutrophils Relative %: 71 %
Platelets: 220 10*3/uL (ref 150–400)
RBC: 4.06 MIL/uL (ref 3.87–5.11)
RDW: 14.8 % (ref 11.5–15.5)
WBC: 5.9 10*3/uL (ref 4.0–10.5)
nRBC: 0 % (ref 0.0–0.2)

## 2021-06-18 LAB — PROCALCITONIN: Procalcitonin: <0.1 ng/mL

## 2021-06-18 LAB — GLUCOSE, CAPILLARY: Glucose-Capillary: 111 mg/dL — ABNORMAL HIGH (ref 70–99)

## 2021-06-18 LAB — BRAIN NATRIURETIC PEPTIDE: B Natriuretic Peptide: 32.4 pg/mL (ref 0.0–100.0)

## 2021-06-18 LAB — SEDIMENTATION RATE: Sed Rate: 52 mm/hr — ABNORMAL HIGH (ref 0–30)

## 2021-06-18 LAB — URIC ACID: Uric Acid, Serum: 2.7 mg/dL (ref 2.5–7.1)

## 2021-06-18 LAB — D-DIMER, QUANTITATIVE: D-Dimer, Quant: 0.96 ug/mL-FEU — ABNORMAL HIGH (ref 0.00–0.50)

## 2021-06-18 LAB — LACTIC ACID, PLASMA: Lactic Acid, Venous: 0.9 mmol/L (ref 0.5–1.9)

## 2021-06-18 LAB — TROPONIN I (HIGH SENSITIVITY)
Troponin I (High Sensitivity): 5 ng/L (ref <18)
Troponin I (High Sensitivity): 5 ng/L (ref <18)

## 2021-06-18 LAB — CK: Total CK: 60 U/L (ref 38–234)

## 2021-06-18 LAB — TSH: TSH: 1.153 u[IU]/mL (ref 0.350–4.500)

## 2021-06-18 LAB — HIV ANTIBODY (ROUTINE TESTING W REFLEX): HIV Screen 4th Generation wRfx: NONREACTIVE

## 2021-06-18 MED ORDER — LOSARTAN POTASSIUM 50 MG PO TABS
25.0000 mg | ORAL_TABLET | Freq: Every day | ORAL | Status: DC
  Administered 2021-06-18 – 2021-06-19 (×2): 25 mg via ORAL
  Filled 2021-06-18 (×2): qty 1

## 2021-06-18 MED ORDER — ONDANSETRON HCL 4 MG/2ML IJ SOLN
4.0000 mg | Freq: Once | INTRAMUSCULAR | Status: AC
  Administered 2021-06-18: 4 mg via INTRAVENOUS
  Filled 2021-06-18: qty 2

## 2021-06-18 MED ORDER — ENOXAPARIN SODIUM 60 MG/0.6ML IJ SOSY
0.5000 mg/kg | PREFILLED_SYRINGE | INTRAMUSCULAR | Status: DC
  Administered 2021-06-18 – 2021-06-19 (×2): 45 mg via SUBCUTANEOUS
  Filled 2021-06-18 (×2): qty 0.6

## 2021-06-18 MED ORDER — IBUPROFEN 400 MG PO TABS
400.0000 mg | ORAL_TABLET | Freq: Four times a day (QID) | ORAL | Status: DC
  Administered 2021-06-18 – 2021-06-19 (×4): 400 mg via ORAL
  Filled 2021-06-18 (×4): qty 1

## 2021-06-18 MED ORDER — LORATADINE 10 MG PO TABS
10.0000 mg | ORAL_TABLET | Freq: Every day | ORAL | Status: DC
  Administered 2021-06-18 – 2021-06-19 (×2): 10 mg via ORAL
  Filled 2021-06-18 (×2): qty 1

## 2021-06-18 MED ORDER — METHYLPREDNISOLONE SODIUM SUCC 125 MG IJ SOLR
125.0000 mg | Freq: Once | INTRAMUSCULAR | Status: AC
  Administered 2021-06-18: 125 mg via INTRAVENOUS
  Filled 2021-06-18: qty 2

## 2021-06-18 MED ORDER — ONDANSETRON HCL 4 MG/2ML IJ SOLN
4.0000 mg | Freq: Four times a day (QID) | INTRAMUSCULAR | Status: DC | PRN
Start: 2021-06-18 — End: 2021-06-19

## 2021-06-18 MED ORDER — PREDNISONE 50 MG PO TABS
50.0000 mg | ORAL_TABLET | Freq: Every day | ORAL | Status: DC
Start: 2021-06-19 — End: 2021-06-19
  Administered 2021-06-19: 50 mg via ORAL
  Filled 2021-06-18: qty 1

## 2021-06-18 MED ORDER — IOHEXOL 350 MG/ML SOLN
100.0000 mL | Freq: Once | INTRAVENOUS | Status: AC | PRN
  Administered 2021-06-18: 100 mL via INTRAVENOUS

## 2021-06-18 MED ORDER — ONDANSETRON HCL 4 MG PO TABS: 4.0000 mg | ORAL_TABLET | Freq: Four times a day (QID) | ORAL | Status: DC | PRN

## 2021-06-18 MED ORDER — FLUTICASONE PROPIONATE 50 MCG/ACT NA SUSP
1.0000 | Freq: Every day | NASAL | Status: DC
  Administered 2021-06-18 – 2021-06-19 (×2): 1 via NASAL
  Filled 2021-06-18: qty 16

## 2021-06-18 MED ORDER — KETOROLAC TROMETHAMINE 30 MG/ML IJ SOLN
15.0000 mg | Freq: Four times a day (QID) | INTRAMUSCULAR | Status: DC | PRN
  Administered 2021-06-18: 15 mg via INTRAVENOUS
  Filled 2021-06-18: qty 1

## 2021-06-18 MED ORDER — PREDNISONE 50 MG PO TABS
50.0000 mg | ORAL_TABLET | Freq: Every day | ORAL | Status: DC
Start: 2021-06-19 — End: 2021-06-18

## 2021-06-18 MED ORDER — ACETAMINOPHEN 325 MG RE SUPP
650.0000 mg | Freq: Four times a day (QID) | RECTAL | Status: DC | PRN
  Filled 2021-06-18: qty 2

## 2021-06-18 MED ORDER — ACETAMINOPHEN 325 MG PO TABS: 650.0000 mg | ORAL_TABLET | Freq: Four times a day (QID) | ORAL | Status: DC | PRN

## 2021-06-18 MED ORDER — DORZOLAMIDE HCL-TIMOLOL MAL 2-0.5 % OP SOLN
1.0000 [drp] | Freq: Two times a day (BID) | OPHTHALMIC | Status: DC
  Administered 2021-06-18 – 2021-06-19 (×3): 1 [drp] via OPHTHALMIC
  Filled 2021-06-18: qty 10

## 2021-06-18 MED ORDER — LACTATED RINGERS IV BOLUS
1000.0000 mL | Freq: Once | INTRAVENOUS | Status: AC
  Administered 2021-06-18: 1000 mL via INTRAVENOUS

## 2021-06-18 MED ORDER — INSULIN ASPART PROT & ASPART (70-30 MIX) 100 UNIT/ML ~~LOC~~ SUSP
45.0000 [IU] | Freq: Two times a day (BID) | SUBCUTANEOUS | Status: DC
  Administered 2021-06-18 – 2021-06-19 (×3): 45 [IU] via SUBCUTANEOUS
  Filled 2021-06-18 (×2): qty 10

## 2021-06-18 MED ORDER — POLYETHYLENE GLYCOL 3350 17 G PO PACK
17.0000 g | PACK | Freq: Every day | ORAL | Status: DC | PRN
  Administered 2021-06-18: 17 g via ORAL
  Filled 2021-06-18: qty 1

## 2021-06-18 MED ORDER — AMLODIPINE BESYLATE 5 MG PO TABS
5.0000 mg | ORAL_TABLET | Freq: Every day | ORAL | Status: DC
  Administered 2021-06-18 – 2021-06-19 (×2): 5 mg via ORAL
  Filled 2021-06-18 (×2): qty 1

## 2021-06-18 MED ORDER — PANTOPRAZOLE SODIUM 40 MG PO TBEC
40.0000 mg | DELAYED_RELEASE_TABLET | Freq: Every day | ORAL | Status: DC
  Administered 2021-06-18: 40 mg via ORAL
  Filled 2021-06-18 (×2): qty 1

## 2021-06-18 MED ORDER — KETOROLAC TROMETHAMINE 15 MG/ML IJ SOLN: 15.0000 mg | Freq: Four times a day (QID) | INTRAMUSCULAR | Status: DC | PRN

## 2021-06-18 MED ORDER — TRAZODONE HCL 50 MG PO TABS
50.0000 mg | ORAL_TABLET | Freq: Every day | ORAL | Status: DC
  Administered 2021-06-18: 50 mg via ORAL
  Filled 2021-06-18: qty 1

## 2021-06-18 MED ORDER — ATORVASTATIN CALCIUM 20 MG PO TABS
40.0000 mg | ORAL_TABLET | Freq: Every day | ORAL | Status: DC
  Administered 2021-06-18 – 2021-06-19 (×2): 40 mg via ORAL
  Filled 2021-06-18 (×2): qty 2

## 2021-06-18 MED ORDER — ASPIRIN EC 81 MG PO TBEC
81.0000 mg | DELAYED_RELEASE_TABLET | Freq: Every day | ORAL | Status: DC
  Administered 2021-06-18 – 2021-06-19 (×2): 81 mg via ORAL
  Filled 2021-06-18 (×2): qty 1

## 2021-06-18 MED ORDER — LACTATED RINGERS IV SOLN: INTRAVENOUS | Status: DC

## 2021-06-18 NOTE — ED Notes (Signed)
Patient transported to MRI 

## 2021-06-18 NOTE — Assessment & Plan Note (Signed)
Continuing home regimen of daily PPI therapy.  

## 2021-06-18 NOTE — Progress Notes (Signed)
PHARMACIST - PHYSICIAN COMMUNICATION ? ?CONCERNING:  Enoxaparin (Lovenox) for DVT Prophylaxis  ? ? ?RECOMMENDATION: ?Patient was prescribed enoxaprin 40mg  q24 hours for VTE prophylaxis.  ? ?Filed Weights  ? 06/17/21 2252  ?Weight: 91 kg (200 lb 9.9 oz)  ? ? ?Body mass index is 36.69 kg/m?. ? ?Estimated Creatinine Clearance: 76.5 mL/min (by C-G formula based on SCr of 0.68 mg/dL). ? ? ?Based on [facility] policy patient is candidate for enoxaparin 0.5mg /kg TBW SQ every 24 hours based on BMI being >30. ? ?DESCRIPTION: ?Pharmacy has adjusted enoxaparin dose per [facility] policy. ? ?Patient is now receiving enoxaparin 0.5 mg/kg every 24 hours  ? ? ?[facility], PharmD, MBA ?06/18/2021 ?5:04 AM ? ?
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
## 2021-06-18 NOTE — Progress Notes (Deleted)
Contacted [facility] and reported to Jennifer RN. EMS transported pt from [facility] to SNF. ?

## 2021-06-18 NOTE — Assessment & Plan Note (Signed)
.   Continuing home regimen of lipid lowering therapy.  

## 2021-06-18 NOTE — Evaluation (Addendum)
Physical Therapy Evaluation ?Patient Details ?Name: Donna Crane ?MRN: [id number] ?DOB: 06/14/1960 ?Today's Date: 06/18/2021 ? ?History of Present Illness ? Pt is a 61 y.o. female who presents to the ED w/ complaints of increased full body pain that began in her knees on 3/20, and progressively spread to her arms, shoulders, back of neck, and upper chest. She reports her arms are heavy and painful that she cannot use her hands for pericare needs. Currently admitted for Polyarthralgia and Chest Pain. PmHx: T2DM, HLD, HTN, GERD, Obesity. ?  ?Clinical Impression ? Pt awake and alert resting in bed upon PT entrance into room for today's evaluation. Virtual Interpreter was utilized throughout session. She reports current pain as 4/10 in LE and 6/10 in UE at rest. Prior to hospitalization she was independent w/ ADLs, living w/ her daughter; she has required assistance for dressing recently due to the increased in extremity pain leading to current admission. ? ?Pt was able to complete bed mobility w/ SUPERVISION; increased time due to pain and utilization of bed rails. Once seated EOB she is able to progress to standing w/ CGA using RW and is able to ambulate ~10ft. She requests to return to recliner due to increase in pain and fatigue. Pt will benefit from continued skilled PT in order to increase LE strength, improve mobility/endurance, decrease c/o pain, and restore PLOF. Current discharge recommendation to home with HHPT is appropriate due to the level of assistance required by the patient to ensure safety and improve overall function. ? ?   ? ?Recommendations for follow up therapy are one component of a multi-disciplinary discharge planning process, led by the attending physician.  Recommendations may be updated based on patient status, additional functional criteria and insurance authorization. ? ?Follow Up Recommendations Home health PT ? ?  ?Assistance Recommended at Discharge Frequent or constant  Supervision/Assistance  ?Patient can return home with the following ? A little help with walking and/or transfers;A little help with bathing/dressing/bathroom;Assistance with cooking/housework;Assist for transportation;Help with stairs or ramp for entrance ? ?  ?Equipment Recommendations Rolling walker (2 wheels);BSC/3in1  ?Recommendations for Other Services ?    ?  ?Functional Status Assessment Patient has had a recent decline in their functional status and demonstrates the ability to make significant improvements in function in a reasonable and predictable amount of time.  ? ?  ?Precautions / Restrictions Precautions ?Precautions: Fall ?Restrictions ?Weight Bearing Restrictions: No  ? ?  ? ?Mobility ? Bed Mobility ?Overal bed mobility: Needs Assistance ?Bed Mobility: Supine to Sit ?  ?  ?Supine to sit: Supervision ?  ?  ?General bed mobility comments: increased time due to pain and reliance on bed rails ?  ? ?Transfers ?Overall transfer level: Needs assistance ?Equipment used: Rolling walker (2 wheels) ?Transfers: Sit to/from Stand, Bed to chair/wheelchair/BSC ?Sit to Stand: Min guard ?  ?Step pivot transfers: Min guard ?  ?  ?  ?  ?  ? ?Ambulation/Gait ?Ambulation/Gait assistance: Min guard ?Gait Distance (Feet): 10 Feet ?Assistive device: Rolling walker (2 wheels) ?Gait Pattern/deviations: Step-to pattern, Decreased step length - right, Decreased step length - left, Decreased stride length ?Gait velocity: decreased ?  ?  ?  ? ?Stairs ?  ?  ?  ?  ?  ? ?Wheelchair Mobility ?  ? ?Modified Rankin (Stroke Patients Only) ?  ? ?  ? ?Balance Overall balance assessment: Needs assistance ?Sitting-balance support: Feet supported, No upper extremity supported ?Sitting balance-Leahy Scale: Good ?  ?  ?Standing balance support: Bilateral  upper extremity supported, During functional activity, Reliant on assistive device for balance ?Standing balance-Leahy Scale: Fair ?  ?  ?  ?  ?  ?  ?  ?  ?  ?  ?  ?  ?   ? ? ? ?Pertinent  Vitals/Pain Pain Assessment ?Pain Assessment: 0-10 ?Pain Score: 6  ?Pain Location: Shoulders, Arms, Wrist, Knees, Ankles ?Pain Descriptors / Indicators: Aching, Guarding, Discomfort, Grimacing, Moaning ?Pain Intervention(s): Limited activity within patient's tolerance, Monitored during session, Repositioned  ? ? ?Home Living Family/patient expects to be discharged to:: Private residence ?Living Arrangements: Children ?Available Help at Discharge: Family;Available PRN/intermittently ?Type of Home: House ?Home Access: Ramped entrance ?  ?  ?  ?Home Layout: One level ?Home Equipment: None ?   ?  ?Prior Function Prior Level of Function : Independent/Modified Independent ?  ?  ?  ?  ?  ?  ?  ?ADLs Comments: prior to onset of symptoms was independent w/ ADLs; now requires assistance due to increased pain ?  ? ? ?Hand Dominance  ?   ? ?  ?Extremity/Trunk Assessment  ? Upper Extremity Assessment ?Upper Extremity Assessment: Generalized weakness;Overall [facility] for tasks assessed ?  ? ?Lower Extremity Assessment ?Lower Extremity Assessment: Generalized weakness;Overall [facility] for tasks assessed ?  ? ?   ?Communication  ? Communication: Interpreter utilized (Spanish)  ?Cognition Arousal/Alertness: Awake/alert ?Behavior During Therapy: [facility] for tasks assessed/performed ?Overall Cognitive Status: Within Functional Limits for tasks assessed ?  ?  ?  ?  ?  ?  ?  ?  ?  ?  ?  ?  ?  ?  ?  ?  ?  ?  ?  ? ?  ?General Comments   ? ?  ?Exercises    ? ?Assessment/Plan  ?  ?PT Assessment Patient needs continued PT services  ?PT Problem List Decreased strength;Decreased mobility;Decreased coordination;Decreased range of motion;Decreased activity tolerance;Decreased balance;Pain ? ?   ?  ?PT Treatment Interventions DME instruction;Therapeutic exercise;Gait training;Balance training;Stair training;Neuromuscular re-education;Functional mobility training;Therapeutic activities;Patient/family education   ? ?PT Goals (Current goals can be found in the  Care Plan section)  ?Acute Rehab PT Goals ?Patient Stated Goal: to decrease symptoms and go home ?PT Goal Formulation: With patient ?Time For Goal Achievement: 07/02/21 ?Potential to Achieve Goals: Good ? ?  ?Frequency Min 2X/week ?  ? ? ?Co-evaluation   ?  ?  ?  ?  ? ? ?  ?AM-PAC PT "6 Clicks" Mobility  ?Outcome Measure Help needed turning from your back to your side while in a flat bed without using bedrails?: A Little ?Help needed moving from lying on your back to sitting on the side of a flat bed without using bedrails?: A Little ?Help needed moving to and from a bed to a chair (including a wheelchair)?: A Little ?Help needed standing up from a chair using your arms (e.g., wheelchair or bedside chair)?: A Little ?Help needed to walk in hospital room?: A Little ?Help needed climbing 3-5 steps with a railing? : A Lot ?6 Click Score: 17 ? ?  ?End of Session   ?Activity Tolerance: Patient tolerated treatment well;Patient limited by pain ?Patient left: in chair;with chair alarm set;with call bell/phone within reach ?Nurse Communication: Mobility status ?PT Visit Diagnosis: Unsteadiness on feet (R26.81);Pain;Muscle weakness (generalized) (M62.81) ?Pain - Right/Left:  (bilateral) ?Pain - part of body: Shoulder;Arm;Hand;Knee;Leg;Ankle and joints of foot ?  ? ?Time: 9373-4287 ?PT Time Calculation (min) (ACUTE ONLY): 23 min ? ? ?Charges:     ?  ?  ?   ? ? ?  Jeralyn BennettAndrew Mikenna Bunkley, SPT ?06/18/2021, 12:48 PM ? ?

## 2021-06-18 NOTE — Assessment & Plan Note (Addendum)
?   Unclear cause, likely anxiety related ?? Lung exam unremarkable ?? No associated hypoxia ?? No evidence of pneumonia, pulmonary edema or pulmonary embolism on imaging ?? Continue to monitor closely ?

## 2021-06-18 NOTE — Progress Notes (Signed)
*  PRELIMINARY RESULTS* ?Echocardiogram ?2D Echocardiogram has been performed. ? ?Donna Crane, Donna Crane ?06/18/2021, 10:16 AM ?

## 2021-06-18 NOTE — Assessment & Plan Note (Addendum)
?   Extremely atypical ?? Likely musculoskeletal as pain is reproducible on exam. ?? No evidence of associated EKG changes ?? Initial troponin unremarkable ?? CT angiogram of the chest negative for pulmonary embolism or pneumonia ?? Monitoring patient on telemetry ?? Cycling cardiac enzymes ?

## 2021-06-18 NOTE — Progress Notes (Signed)
[facility]  - [place] [facility] ? ? ?PATIENT NAME: Donna Crane   ? ?MR#:  [id number] ? ?DATE OF BIRTH:  06/07/1960 ? ?SUBJECTIVE:  ?history via interpreter and daughter in the room ? ?patient comes in with complains of multiple joint pain mainly fingers and toes for last 5 to 6 days. ?Denies any fever recent travel cough short rest of breath nausea vomiting or diarrhea ? ?she complains of pain with I tried to touch her joints. ?She reports she has missed her weekly dose of Humira shots which she takes for skin infection due to some issues with prescription. ? ? ? ? ?VITALS:  ?Blood pressure (!) 120/54, pulse 72, temperature 98.4 ?F (36.9 ?C), resp. rate 18, height 5\' 2"  (1.575 m), weight 91 kg, SpO2 93 %. ? ?PHYSICAL EXAMINATION:  ? ?GENERAL:  61 y.o.-year-old patient lying in the bed with no acute distress. obese ?LUNGS: Normal breath sounds bilaterally, no wheezing, rales, rhonchi.  ?CARDIOVASCULAR: S1, S2 normal. No murmurs, rubs, or gallops.  ?Reproducible joint pain ?ABDOMEN: Soft, nontender, nondistended. Bowel sounds present.  ?EXTREMITIES: tender to touch small joints. No skin rash ?NEUROLOGIC: nonfocal  patient is alert and awake ?SKIN: No obvious rash, lesion, or ulcer.  ? ?LABORATORY PANEL:  ?CBC ?Recent Labs  ?Lab 06/18/21 ?0609  ?WBC 5.9  ?HGB 10.6*  ?HCT 33.4*  ?PLT 220  ? ? ?Chemistries  ?Recent Labs  ?Lab 06/18/21 ?0609  ?NA 137  ?K 3.7  ?CL 105  ?CO2 24  ?GLUCOSE 118*  ?BUN 11  ?CREATININE 0.68  ?CALCIUM 8.3*  ?MG 2.1  ?AST 18  ?ALT 16  ?ALKPHOS 63  ?BILITOT 0.7  ? ?Cardiac Enzymes ?No results for input(s): TROPONINI in the last 168 hours. ?RADIOLOGY:  ?DG Chest 2 View ? ?Result Date: 06/17/2021 ?CLINICAL DATA:  Chest pain EXAM: CHEST - 2 VIEW COMPARISON:  01/19/2018 FINDINGS: Cardiac shadow is mildly enlarged. Postsurgical changes are seen in the right shoulder and sternum. Overall inspiratory effort is poor. No focal infiltrate or effusion is seen. Degenerative changes of the  thoracic spine are noted. IMPRESSION: No active cardiopulmonary disease. Electronically Signed   By: 01/21/2018 M.D.   On: 06/17/2021 23:30  ? ?CT Angio Chest PE W and/or Wo Contrast ? ?Result Date: 06/18/2021 ?CLINICAL DATA:  Pulmonary embolism (PE) suspected, positive D-dimer. Dyspnea, chest pain EXAM: CT ANGIOGRAPHY CHEST WITH CONTRAST TECHNIQUE: Multidetector CT imaging of the chest was performed using the standard protocol during bolus administration of intravenous contrast. Multiplanar CT image reconstructions and MIPs were obtained to evaluate the vascular anatomy. RADIATION DOSE REDUCTION: This exam was performed according to the departmental dose-optimization program which includes automated exposure control, adjustment of the mA and/or kV according to patient size and/or use of iterative reconstruction technique. CONTRAST:  06/20/2021 OMNIPAQUE IOHEXOL 350 MG/ML SOLN COMPARISON:  None. FINDINGS: Cardiovascular: Adequate opacification of the pulmonary arterial tree. No intraluminal filling defect identified to suggest acute pulmonary embolism. Central pulmonary arteries are of normal caliber. Extensive multi-vessel coronary artery calcification. Cardiac size within normal limits. Median sternotomy has been performed. No pericardial effusion. Mild atherosclerotic calcification within the thoracic aorta. No aortic aneurysm. Mediastinum/Nodes: Thyroid unremarkable. No pathologic thoracic adenopathy. Small hiatal hernia. The esophagus is patulous. Lungs/Pleura: Lungs are clear. No pleural effusion or pneumothorax. Upper Abdomen: No acute abnormality. Musculoskeletal: Osseous structures are age-appropriate. No acute bone abnormality. No lytic or blastic bone lesion. Review of the MIP images confirms the above findings. IMPRESSION: No pulmonary embolism. Extensive multi-vessel  coronary artery calcification. Aortic Atherosclerosis (ICD10-I70.0). Electronically Signed   By: Helyn Numbers M.D.   On: 06/18/2021 02:40   ? ?MR Cervical Spine Wo Contrast ? ?Result Date: 06/18/2021 ?CLINICAL DATA:  Neck pain with arm weakness and swelling EXAM: MRI CERVICAL SPINE WITHOUT CONTRAST TECHNIQUE: Multiplanar, multisequence MR imaging of the cervical spine was performed. No intravenous contrast was administered. COMPARISON:  None. FINDINGS: Alignment: Physiologic. Vertebrae: No fracture, evidence of discitis, or bone lesion. Cord: Normal signal and morphology. Posterior Fossa, vertebral arteries, paraspinal tissues: Negative. Disc levels: C1-2: Unremarkable. C2-3: Normal disc space and facet joints. There is no spinal canal stenosis. No neural foraminal stenosis. C3-4: Small disc bulge. There is no spinal canal stenosis. Mild right neural foraminal stenosis. C4-5: Left facet hypertrophy. There is no spinal canal stenosis. No neural foraminal stenosis. C5-6: Minimal disc bulge with right uncovertebral spurring. There is no spinal canal stenosis. Mild right neural foraminal stenosis. C6-7: Normal disc space and facet joints. There is no spinal canal stenosis. No neural foraminal stenosis. C7-T1: Normal disc space and facet joints. There is no spinal canal stenosis. No neural foraminal stenosis. IMPRESSION: 1. Mild right neural foraminal stenosis at C3-4 and C5-6. 2. No spinal canal stenosis. Electronically Signed   By: Deatra Robinson M.D.   On: 06/18/2021 01:37   ? ?Assessment and Plan ?61 year old Spanish-speaking female with past medical history of hyperlipidemia, insulin-dependent diabetes mellitus type 2, gastroesophageal reflux disease, hypertension and obesity who presents to [facility] emergency department with numerous complaints including shortness of breath, chest pain and generalized body aches. ?  ?Patient explains that approximately 4 days ago she began to experience multiple symptoms including generalized malaise and weakness with severe polyarthralgia ? ?Polyarthralgia ?-- patient complains of multiple joint aches and biddable swelling  appears uncomfortable for last 5 to 6 days. ?-- Unclear etiology. ?-- Uric acid normal. TSH within normal limit. HIV-negative. Anti-double-stranded DNA pending. ?-- Patient is on HUMIRA for skin infection but she has not been able to take it for two weeks due to issues with prescription-- not sure if her polyarthritis flared due to that ?-- will give her IV Solu-Medrol 125 mg today and that prednisone from tomorrow, ibuprofen 400 mg Q ID with meals ?-- physical therapy recommends home PT OT ?-- I spoke at length with patient and daughter to get referral to [facility] rheumatology through PCP. Daughter will send message through [facility] my chart. ? ?Diabetes type II uncontrolled with retinopathy ?-- continue insulin ?-- A1c recent as outpatient 8.2 ?-- carb control diet ? ?Genella Rife ?-- continue PPI ? ?chest pain appears costochondritis since it is reproducible ?-- EKG no acute STT changes ?-- cardiac enzymes normal ? ?Hyperlipidemia ?-- continue statins ? ? ? ?Procedures: ?Family communication : daughter at bedside ?Consults : none ?CODE STATUS: full ?DVT Prophylaxis : Lovenox ?Level of care: Med-Surg ?Status is: Observation ?The patient remains OBS appropriate and will d/c before 2 midnights. ?Starting patient on IV steroid and thereafter overall along with PO pain meds. If improves will discharged tomorrow ? ?TOTAL TIME TAKING CARE OF THIS PATIENT: 25 minutes.  ?>50% time spent on counselling and coordination of care ? ?Note: This dictation was prepared with Dragon dictation along with smaller phrase technology. Any transcriptional errors that result from this process are unintentional. ? ?Enedina Finner M.D  ? ? ?[facility]  ? ?CC: ?Primary care physician; Cleon Dew, FNP  ?

## 2021-06-18 NOTE — ED Notes (Signed)
Pt presents to ED with c/o of chest pain. Pt also c/o of tingling pins and needles feeling in her toes that's went up to her knees and legs and spread to her head. Pt states that it started on Monday and each day it progressed over time. Pt states that she has a pressure in her central chest that started Wednesday night. Pt denies any significant cardiac issues and taking any blood thinners.  ?

## 2021-06-18 NOTE — Assessment & Plan Note (Signed)
?   Patient been placed on Accu-Cheks before every meal and nightly with sliding scale insulin ?? Continuing home regimen of NovoLog 70/30 ?? Hemoglobin A1C ordered ?? Diabetic Diet ? ?

## 2021-06-18 NOTE — Assessment & Plan Note (Addendum)
?   Extremely complex presentation  ?? Patient has a multitude of symptoms however the most uncomfortable symptom seems to be the severe polyarthralgias that have been going on for approximate the past 5 days.   ?? Differential diagnosis includes possibilities such as viral illness, disseminated gonococcal infection  (not sexually active for 5 years), gout  (joint exams not consistent), pseudogout, serum sickness (unlikely) or possibly development of a lupus-like syndrome secondary to use of Humira ?? Patient underwent extensive work-up in the emergency department for sources of infection which was negative.  ?? Obtaining ANA, inflammatory markers, antProviding patient withi-double-stranded DNA, uric acid, TSH, HIV testing and COVID-19 PCR ?? Hydrating patient with intravenous isotonic fluids ?? As needed anti-inflammatories for pain ?? After work-up is complete if there is no evidence of infection patient may benefit from a trial course of oral prednisone ?? As needed intravenous antiemetics ?

## 2021-06-18 NOTE — ED Notes (Signed)
Pt back from MRI, vomiting, Dr. Darnelle Catalan aware new orders for zofran IV ?

## 2021-06-19 DIAGNOSIS — M2550 Pain in unspecified joint: Secondary | ICD-10-CM | POA: Diagnosis not present

## 2021-06-19 LAB — ECHOCARDIOGRAM COMPLETE
Height: 62 in
S' Lateral: 2.4 cm
Weight: 3209.9 oz

## 2021-06-19 LAB — RHEUMATOID FACTOR: Rheumatoid fact SerPl-aCnc: <10 IU/mL (ref <14.0)

## 2021-06-19 LAB — ANTI-DNA ANTIBODY, DOUBLE-STRANDED: ds DNA Ab: 1 IU/mL (ref 0–9)

## 2021-06-19 LAB — ANA W/REFLEX IF POSITIVE: Anti Nuclear Antibody (ANA): NEGATIVE

## 2021-06-19 MED ORDER — IBUPROFEN 400 MG PO TABS: 400.0000 mg | ORAL_TABLET | Freq: Four times a day (QID) | ORAL | 0 refills | Status: AC

## 2021-06-19 MED ORDER — PREDNISONE 10 MG PO TABS: ORAL_TABLET | ORAL | 0 refills | Status: AC

## 2021-06-19 NOTE — Plan of Care (Signed)

## 2021-06-19 NOTE — Discharge Instructions (Signed)
Pt/family advised to send [facility] PCP message for Rheumatology Referral ?

## 2021-06-19 NOTE — TOC Transition Note (Signed)
Transition of Care (TOC) - CM/SW Discharge Note ? ? ?Patient Details  ?Name: Donna Crane ?MRN: [id number] ?Date of Birth: 1960-08-22 ? ?Transition of Care (TOC) CM/SW Contact:  ?Bing Quarry, RN ?Phone Number: ?06/19/2021, 9:16 AM ? ? ?Clinical Narrative:  Patient has discharge to home/home health PT/OT orders. [facility] accepted per Feliberto Gottron after speaking with Daughter, [name]. No DME orders. Start of service, Monday, 06/21/21. Gabriel Cirri RN CM  ? ? ? ?Final next level of care: Home w Home Health Services ?Barriers to Discharge: Barriers Resolved ? ? ?Patient Goals and CMS Choice ?  ?  ?  ? ?Discharge Placement ?  ?           ?  ?  ?  ?  ? ?Discharge Plan and Services ?  ?  ?           ?DME Arranged: N/A ?DME Agency: NA ?  ?  ?  ?HH Arranged: OT, PT ?HH Agency: [facility] ?Date HH Agency Contacted: 06/19/21 ?Time HH Agency Contacted: [phone] ?Representative spoke with at [facility] Agency: Feliberto Gottron ? ?Social Determinants of Health (SDOH) Interventions ?  ? ? ?Readmission Risk Interventions ?   ? View : No data to display.  ?  ?  ?  ? ? ? ? ? ?

## 2021-06-19 NOTE — Discharge Summary (Signed)
?Physician Discharge Summary ?  ?Patient: Donna Crane MRN: [id number] DOB: [birth year]  ?Admit date:     06/17/2021  ?Discharge date: 06/19/21  ?Discharge Physician: Enedina Finner  ? ?PCP: Cleon Dew, FNP  ? ?Recommendations at discharge:  ? ? F/u PCP in 1-2 weeks and discuss regarding Rheumatology referral for evaluation of polyarthralgia ? ?Discharge Diagnoses: ?Polyarthralgia ? ?Hospital Course: ?61 year old Spanish-speaking female with past medical history of hyperlipidemia, insulin-dependent diabetes mellitus type 2, gastroesophageal reflux disease, hypertension and obesity who presents to [facility] emergency department with numerous complaints including shortness of breath, chest pain and generalized body aches. ?  ?Patient explains that approximately 4 days ago she began to experience multiple symptoms including generalized malaise and weakness with severe polyarthralgia ?  ?Polyarthralgia ?-- patient complains of multiple joint aches and biddable swelling appears uncomfortable for last 5 to 6 days. ?-- Unclear etiology. ?-- Uric acid normal. TSH within normal limit. HIV-negative. Anti-double-stranded DNA pending. ?-- Patient is on HUMIRA for skin infection but she has not been able to take it for two weeks due to issues with prescription-- not sure if her polyarthritis flared due to that ?-- will give her IV Solu-Medrol 125 mg today and po short prednisone taper from tomorrow, ibuprofen 400 mg Q ID with meals ?-- physical therapy recommends home PT OT ?-- I spoke at length with patient and daughter to get referral to [facility] rheumatology through PCP. Daughter will send message through [facility] my chart. ?--3/25--feels a lot better today. Spoke with dter on the phone. D/c home with out pt f/u [facility] ? ?Diabetes type II uncontrolled with retinopathy ?-- continue insulin ?-- A1c recent as outpatient 8.2 ?-- carb control diet ?  ?Genella Rife ?-- continue PPI ?  ?chest pain appears costochondritis since it is reproducible ?-- EKG no  acute STT changes ?-- cardiac enzymes normal ?  ?Hyperlipidemia ?-- continue statins ?  ? ?  ? ? ?Consultants: none ?Procedures performed: none  ?Disposition: Home health ?Diet recommendation:  ?Discharge Diet Orders (From admission, onward)  ? ?  Start     Ordered  ? 06/19/21 0000  Diet - low sodium heart healthy       ? 06/19/21 0853  ? ?  ?  ? ?  ? ?Carb modified diet ?DISCHARGE MEDICATION: ?Allergies as of 06/19/2021   ?No Known Allergies ?  ? ?  ?Medication List  ?  ? ?STOP taking these medications   ? ?clindamycin 1 % Swab ?Commonly known as: CLEOCIN T ?  ? ?  ? ?TAKE these medications   ? ?amLODipine 5 MG tablet ?Commonly known as: NORVASC ?Take 5 mg by mouth daily. ?  ?aspirin 81 MG EC tablet ?Take 1 tablet by mouth daily. ?  ?atorvastatin 40 MG tablet ?Commonly known as: LIPITOR ?Take 40 mg by mouth daily. ?  ?Centrum Silver tablet ?Take 1 tablet by mouth every morning. ?  ?cetirizine 10 MG tablet ?Commonly known as: ZYRTEC ?Take 10 mg by mouth daily. ?  ?dorzolamide-timolol 22.3-6.8 MG/ML ophthalmic solution ?Commonly known as: COSOPT ?Place 1 drop into both eyes 2 (two) times daily. ?  ?fluticasone 50 MCG/ACT nasal spray ?Commonly known as: FLONASE ?Place into both nostrils. ?  ?Humira Pen 40 MG/0.4ML Pnkt ?Generic drug: Adalimumab ?SMARTSIG:40 Milligram(s) SUB-Q Once a Week ?  ?ibuprofen 400 MG tablet ?Commonly known as: ADVIL ?Take 1 tablet (400 mg total) by mouth 4 (four) times daily. For 1-2 days with meals and then take as needed ?  ?Jardiance 25 MG  Tabs tablet ?Generic drug: empagliflozin ?Take 25 mg by mouth daily. ?  ?losartan 25 MG tablet ?Commonly known as: COZAAR ?Take 25 mg by mouth daily. ?  ?metFORMIN 1000 MG tablet ?Commonly known as: GLUCOPHAGE ?Take 1,000 mg by mouth 2 (two) times daily. ?  ?NovoLOG Mix 70/30 FlexPen (70-30) 100 UNIT/ML FlexPen ?Generic drug: insulin aspart protamine - aspart ?Inject 45 Units into the skin 2 (two) times daily with a meal. ?  ?omeprazole 20 MG  capsule ?Commonly known as: PRILOSEC ?Take 20 mg by mouth daily. ?  ?predniSONE 10 MG tablet ?Commonly known as: DELTASONE ?Take 50 mg daily taper by 10 mg daily then stop ?  ?traZODone 50 MG tablet ?Commonly known as: DESYREL ?Take 50 mg by mouth at bedtime. ?  ? ?  ? ?  ?  ? ? ?  ?Durable Medical Equipment  ?(From admission, onward)  ?  ? ? ?  ? ?  Start     Ordered  ? 06/18/21 1449  For home use only DME 3 n 1  Once       ? 06/18/21 1448  ? 06/18/21 1448  For home use only DME Walker rolling  Once       ?Question Answer Comment  ?Walker: With 5 Inch Wheels   ?Patient needs a walker to treat with the following condition Weakness   ?  ? 06/18/21 1448  ? ?  ?  ? ?  ? ? Follow-up Information   ? ? Cleon DewMulholland, Andrea, FNP. Schedule an appointment as soon as possible for a visit in 1 week(s).   ?Specialty: Family Medicine ?Why: pt/family to call and make appt for hospital f/u ?Contact information: ?[address] [place] [id number] ?[phone] ? ? ?  ?  ? ?  ?  ? ?  ? ?Discharge Exam: ?Filed Weights  ? 06/17/21 2252  ?Weight: 91 kg  ? ? ? ?Condition at discharge: fair ? ?The results of significant diagnostics from this hospitalization (including imaging, microbiology, ancillary and laboratory) are listed below for reference.  ? ?Imaging Studies: ?DG Chest 2 View ? ?Result Date: 06/17/2021 ?CLINICAL DATA:  Chest pain EXAM: CHEST - 2 VIEW COMPARISON:  01/19/2018 FINDINGS: Cardiac shadow is mildly enlarged. Postsurgical changes are seen in the right shoulder and sternum. Overall inspiratory effort is poor. No focal infiltrate or effusion is seen. Degenerative changes of the thoracic spine are noted. IMPRESSION: No active cardiopulmonary disease. Electronically Signed   By: Alcide CleverMark  Lukens M.D.   On: 06/17/2021 23:30  ? ?CT Angio Chest PE W and/or Wo Contrast ? ?Result Date: 06/18/2021 ?CLINICAL DATA:  Pulmonary embolism (PE) suspected, positive D-dimer. Dyspnea, chest pain EXAM: CT ANGIOGRAPHY CHEST WITH CONTRAST  TECHNIQUE: Multidetector CT imaging of the chest was performed using the standard protocol during bolus administration of intravenous contrast. Multiplanar CT image reconstructions and MIPs were obtained to evaluate the vascular anatomy. RADIATION DOSE REDUCTION: This exam was performed according to the departmental dose-optimization program which includes automated exposure control, adjustment of the mA and/or kV according to patient size and/or use of iterative reconstruction technique. CONTRAST:  100mL OMNIPAQUE IOHEXOL 350 MG/ML SOLN COMPARISON:  None. FINDINGS: Cardiovascular: Adequate opacification of the pulmonary arterial tree. No intraluminal filling defect identified to suggest acute pulmonary embolism. Central pulmonary arteries are of normal caliber. Extensive multi-vessel coronary artery calcification. Cardiac size within normal limits. Median sternotomy has been performed. No pericardial effusion. Mild atherosclerotic calcification within the thoracic aorta. No aortic aneurysm. Mediastinum/Nodes: Thyroid  unremarkable. No pathologic thoracic adenopathy. Small hiatal hernia. The esophagus is patulous. Lungs/Pleura: Lungs are clear. No pleural effusion or pneumothorax. Upper Abdomen: No acute abnormality. Musculoskeletal: Osseous structures are age-appropriate. No acute bone abnormality. No lytic or blastic bone lesion. Review of the MIP images confirms the above findings. IMPRESSION: No pulmonary embolism. Extensive multi-vessel coronary artery calcification. Aortic Atherosclerosis (ICD10-I70.0). Electronically Signed   By: Helyn Numbers M.D.   On: 06/18/2021 02:40  ? ?MR Cervical Spine Wo Contrast ? ?Result Date: 06/18/2021 ?CLINICAL DATA:  Neck pain with arm weakness and swelling EXAM: MRI CERVICAL SPINE WITHOUT CONTRAST TECHNIQUE: Multiplanar, multisequence MR imaging of the cervical spine was performed. No intravenous contrast was administered. COMPARISON:  None. FINDINGS: Alignment: Physiologic.  Vertebrae: No fracture, evidence of discitis, or bone lesion. Cord: Normal signal and morphology. Posterior Fossa, vertebral arteries, paraspinal tissues: Negative. Disc levels: C1-2: Unremarkable. C2-3: Normal disc space a

## 2021-06-19 NOTE — Progress Notes (Signed)
Discharge Note: ?Reviewed discharge instructions with patient and daughter. Pt and daughter verbalized understanding. Pt discharged with all personal belongings. Staff wheeled pt out. Pt transported to home via family private car.  ?

## 2021-06-21 NOTE — Unmapped (Addendum)
NEUROPSYCHOLOGICAL EVALUATION  [facility]     Reason for Referral    Anne Barber is a 61 y.o., Hispanic or Latino ethnicity, SPANISH speaking female with a history of diabetes and other vascular conditions, depression, severe insomnia, and various pains including migraines, with concern for cognitive difficulty the last couple years (after losing her husband the year before). She was referred for neuropsychological testing by Urbano Heir, MD, to measure her current cognitive functioning for diagnostic clarification and patient care recommendations.      Assessment and Plan    Impressions:    Results from today???s cognitive assessment appeared valid. Anne Barber???s performance on a battery of neuropsychological tests showed below average cognitive abilities for her age and education level overall, consistent with a mild decline from her expected baseline. Specifically, she showed impaired retrieval on one memory task and low verbal comprehension on one task (though in conversation it was normal when attending well), and below average basic attention and word retrieval. She was more consistently strong with orientation, other aspects of memory including retention, visuospatial ability, and most executive functions. A self-report measure was consistent with severe depression symptoms. Her daughter completed a questionnaire rating her as requiring assistance in nearly all IADLs.     Anne Barber pattern on testing is suggestive of a mild subcortical/generalized dysfunction. In her case, factors which likely explain this include her severe depression, pain, and fatigue/sleeplessness. Medication side effects and possible cerebrovascular illness (given her history of diabetes, hypertension, etc.) are also likely contributing some. Alzheimer???s disease appears unlikely given the timeline of her symptoms (beginning a year after losing her husband and correlated with her depression and insomnia) and her intact retention memory, language, orientation, and Social research officer, government.     Recommendations:  ??? Treatment of Depression: Although she does not necessarily endorse the label depression when directly asked, she does end up endorsing a severe level of depression symptoms and does agree that she has it. Her depression correlates with the timeline of her cognitive symptoms too, so treatment is strongly recommended for her quality of life but also her cognitive and other functioning. Her prescribing providers should determine whether she is on the correct medication for this. Meanwhile, I provided her info on [facility], a mental health clinic for Spanish-speakers that should be able to help her get connected to a therapist/counselor to work on grief and depression.  ??? Pain and Fatigue: [name] should continue workign with her providers on optimal pain management. Meanwhile, her sleep has been especially poor. I recommend they continue with the plan to get a sleep study, since apnea could be a contributor. I provided her a handout on sleep hygeine, but she could also work with a Veterinary surgeon on that. She should discuss any pharmacological sleep treatments with her providers.  ??? Other Neurologic Workup: She has not followed with the order to get a brain MRI lately. I did not see significant cerebrovascular illness on her past MRI, although some could have surfaced in the last couple years. I will defer to her neurologist on the need for an updated MRI and any other workup to rule out other causes of memory difficulty, although as noted above I do suspect a combination of pain, mood, and sleep to be the main issue here.  ??? Repeat Testing: Can be ordered in the future if [name] shows further cognitive decline, especially if her mood, pain, and/or sleep are better controlled and she still shows unexpected difficulty. Typically  this is best done at least a year out from current testing although it can be ordered earlier if need be.     Review of Records    A brain MRI from 10/30/2019 was read as showing normal size ventricles and no acute intracranial hemorrhage or acute infarct.     Anne Barber saw Urbano Heir, MD with resident Pearline Cables, MD, on 02/12/2021 for neurology evaluation.  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 She had migraine headaches. She had a decline in memory and change in behavior with agitation, which the note said was over the last four months (note this is different from patient and her daughter???s report today). She had difficulty performing ADLs due to visual acuity issues, so it was hard to tell how much memory decline was affecting things. A sleep study was recommended as she snored loudly and sleep apnea was a possibility. A brain MRI was also ordered.          Past Medical History:   Diagnosis Date   ??? Anisocoria    ??? Cataract     Nuclear and Mild Cortical Cataracts, OU   ??? Depression    ??? Diabetes mellitus (CMS-HCC) Dx 1996    Type II   ??? Diabetic retinopathy (CMS-HCC)     PDR OU and mild DME avastin injections x6 right eye  and left eye x1,PRP 2015   ??? Dry eyes    ??? Fractures    ??? Glaucoma     History of Neovascular Glaucoma OD, indeterminate stage   ??? Hypertension    ??? PONV (postoperative nausea and vomiting)    ??? Shoulder injury    ??? Tuberculosis 1978    completed one year of treatment.      Past Surgical History:   Procedure Laterality Date   ??? ANKLE FRACTURE SURGERY     ??? CATARACT EXTRACTION EXTRACAPSULAR W/ INTRAOCULAR LENS IMPLANTATION Left 10/10/2019   ??? HAND SURGERY     ??? PR AQUEOUS SHUNT EXTRAOC EQUAT PLATE RSVR W/GRAFT Right 10/12/2013    Procedure: AQUEOUS SHUNT-EXTRAOCULAR RESERVOIR;  Surgeon: Leda Roys, MD;  [facility]: MAIN [facility];  Service: Ophthalmology   ??? PR COLONOSCOPY W/BIOPSY SINGLE/MULTIPLE Left 11/16/2012    Procedure: COLONOSCOPY, FLEXIBLE, PROXIMAL TO SPLENIC FLEXURE; WITH BIOPSY, SINGLE OR MULTIPLE;  Surgeon: Lauretta Grill, MD;  Location: [facility] PROCEDURES [facility];  Service: Gastroenterology   ??? PR DRAIN ANT CHMBR,REMV BLOOD Right 10/12/2013    Procedure: PARACENTESIS (SEPART PROC); W/REMOV BLD;  Surgeon: Leda Roys, MD;  Location: MAIN OR [facility];  Service: Ophthalmology   ??? PR EYE SURG POST SGMT PROC UNLISTED Left 08/10/2016    PPV/EL/AIR left eye for VH   ??? PR OPEN RX STERNUM FRACTURE N/A 03/01/2018    Procedure: OPEN TX STERNUM FX W/WO SKELETAL FIXA;  Surgeon: Cherie Dark, MD;  Location: MAIN OR [facility];  Service: Thoracic   ??? PR REINFORCE SCLERA EYE W GRAFT Right 10/12/2013    Procedure: SCLERAL REINFORCEMENT (SEPART PROC); W/GFT;  Surgeon: Leda Roys, MD;  Location: MAIN OR [facility];  Service: Ophthalmology   ??? PR UPPER GI ENDOSCOPY,BIOPSY N/A 11/16/2012    Procedure: UGI ENDOSCOPY; WITH BIOPSY, SINGLE OR MULTIPLE;  Surgeon: Lauretta Grill, MD;  Location: [facility] PROCEDURES [facility];  Service: Gastroenterology   ??? PR UPPER GI ENDOSCOPY,BIOPSY N/A 05/08/2013    Procedure: UGI ENDOSCOPY; WITH BIOPSY, SINGLE  OR MULTIPLE;  Surgeon: Clint Bolder, MD;  [facility];  Service: Gastroenterology   ??? PR VITRECTOMY,PANRETINAL LASER RX Left 09/24/2014    Procedure: VITRECTOMY, MECHANICAL, PARS PLANA APPROACH; WITH ENDOLASER PANRETINAL PHOTOCOAGULATION;  Surgeon: Rockwell Alexandria, MD;  Location: [facility] OR [facility];  Service: Ophthalmology   ??? PR VITRECTOMY,PANRETINAL LASER RX Left 08/10/2016    Procedure: VITRECTOMY, MECHANICAL, PARS PLANA APPROACH; WITH ENDOLASER PANRETINAL PHOTOCOAGULATION;  Surgeon: Nicholaus Corolla, MD;  Location[facility] OR [facility];  Service: Ophthalmology   ??? PR VITRECTOMY,PANRETINAL LASER RX Right 06/26/2019    Procedure: VITRECTOMY, MECHANICAL, PARS PLANA APPROACH; WITH ENDOLASER PANRETINAL PHOTOCOAGULATION;  Surgeon: Nicholaus Corolla, MD;  Location: [facility] OR [facility];  Service: Ophthalmology   ??? PR XCAPSL CTRC RMVL INSJ IO LENS PROSTH W/O ECP Right 09/26/2019    Procedure: EXTRACAPSULAR CATARACT REMOVAL W/INSERTION OF INTRAOCULAR LENS PROSTHESIS, MANUAL OR MECHANICAL TECHNIQUE WITHOUT ENDOSCOPIC CYCLOPHOTOCOAGULATION;  Surgeon: Arville Care, MD;  Location: [facility] OR [facility];  Service: Ophthalmology   ??? PR XCAPSL CTRC RMVL INSJ IO LENS PROSTH W/O ECP Left 10/10/2019    Procedure: EXTRACAPSULAR CATARACT REMOVAL W/INSERTION OF INTRAOCULAR LENS PROSTHESIS, MANUAL OR MECHANICAL TECHNIQUE WITHOUT ENDOSCOPIC CYCLOPHOTOCOAGULATION;  Surgeon: Arville Care, MD;  Location: [facility] OR [facility];  Service: Ophthalmology   ??? SHOULDER SURGERY  2016    right   ??? TUBAL LIGATION     ??? YAG CAPSULOTOMY - OU - BOTH EYES Bilateral 11/08/2019     Family History   Problem Relation Age of Onset   ??? No Known Problems Mother    ??? No Known Problems Father    ??? Diabetes Sister    ??? No Known Problems Daughter    ??? No Known Problems Daughter    ??? Diabetes Brother    ??? Diabetes Grandchild         Type 2   ??? No Known Problems Maternal Aunt    ??? No Known Problems Maternal Uncle    ??? No Known Problems Paternal Aunt    ??? No Known Problems Paternal Uncle    ??? No Known Problems Maternal Grandmother    ??? No Known Problems Maternal Grandfather    ??? No Known Problems Paternal Grandmother    ??? Psoriasis Paternal Grandfather    ??? No Known Problems Other    ??? Clotting disorder Neg Hx    ??? Anesthesia problems Neg Hx    ??? Thyroid disease Neg Hx    ??? Amblyopia Neg Hx    ??? Blindness Neg Hx    ??? Cancer Neg Hx    ??? Cataracts Neg Hx    ??? Glaucoma Neg Hx    ??? Hypertension Neg Hx    ??? Macular degeneration Neg Hx    ??? Retinal detachment Neg Hx    ??? Strabismus Neg Hx    ??? Stroke Neg Hx    ??? Melanoma Neg Hx    ??? Basal cell carcinoma Neg Hx    ??? Squamous cell carcinoma Neg Hx    ??? Substance Abuse Disorder Neg Hx    ??? Mental illness Neg Hx        Current Outpatient Medications:   ???  alcohol swabs PadM, Test daily before all meals/snacks and once before bedtime., Disp: 400 each, Rfl: 1  ???  amLODIPine (NORVASC) 5 MG tablet, Take 1 tablet (5 mg total) by mouth daily., Disp: 90 tablet, Rfl: 1  ???  aspirin (ECOTRIN) 81 MG tablet,  Take 1 tablet (81 mg total) by mouth daily., Disp: 150 tablet, Rfl: 2  ???  atorvastatin (LIPITOR) 40 MG tablet, Take 1 tablet (40 mg total) by mouth daily., Disp: 90 tablet, Rfl: 3  ???  BD INSULIN SYRINGE ULT-FINE II 1 mL 31 gauge x 5/16 Syrg, 1 Package by Miscellaneous route Two (2) times a day., Disp: 100 each, Rfl: 3  ???  blood sugar diagnostic (ONETOUCH ULTRA BLUE TEST STRIP) Strp, USE TO CHECK BLOOD SUGAR UP TO 10 TIMES DAILY AS NEEDED, Disp: 600 strip, Rfl: 1  ???  blood-glucose meter kit, Use as instructed, Disp: 1 each, Rfl: 0  ???  cetirizine (ZYRTEC) 10 MG tablet, Take 1 tablet (10 mg total) by mouth daily., Disp: 30 tablet, Rfl: 2  ???  clindamycin (CLEOCIN T) 1 % lotion, Apply topically every morning., Disp: 60 mL, Rfl: 5  ???  clindamycin (CLEOCIN T) 1 % Swab, Apply 1 application topically Two (2) times a day., Disp: 60 each, Rfl: 11  ???  dexamethasone (DECADRON) 0.1 % ophthalmic solution, Administer 4 drops into the left ear Two (2) times a day for 30 days, Disp: 5 mL, Rfl: 0  ???  dextran 70-hypromellose, PF, (TEARS NATURAL FREE) 0.1-0.3 % Dpet, , Disp: , Rfl:   ???  dextromethorphan (DELSYM) 30 mg/5 mL liquid, Take 10 mL (60 mg total) by mouth Two (2) times a day. As needed for cough., Disp: 150 mL, Rfl: 0  ???  dorzolamide-timoloL (COSOPT) 22.3-6.8 mg/mL ophthalmic solution, Administer 1 drop to both eyes Two (2) times a day., Disp: 10 mL, Rfl: 12  ???  empagliflozin (JARDIANCE) 25 mg tablet, Take 1 tablet (25 mg total) by mouth daily., Disp: 90 tablet, Rfl: 3  ???  empty container Misc, Use as directed to dispose of Humira pens., Disp: 1 each, Rfl: 2  ???  flash glucose sensor (FREESTYLE LIBRE 2 SENSOR) kit, by Other route every ten (10) days., Disp: 3 each, Rfl: 11  ???  fluticasone propionate (FLONASE) 50 mcg/actuation nasal spray, 1 spray into each nostril two (2) times a day., Disp: 48 g, Rfl: 1  ???  folic acid/multivit-min/lutein (CENTRUM SILVER ORAL), Take 1 tablet by mouth daily at 0600. , Disp: , Rfl:   ???  HUMIRA PEN CITRATE FREE 40 MG/0.4 ML, Inject 0.4 mL (40 mg total) under the skin every seven (7) days., Disp: 4 each, Rfl: 11  ???  HUMIRA PEN CITRATE FREE STARTER PACK FOR CROHN'S/UC/HS 3 X 80 MG/0.8 ML, Inject the contents of 2 pens (160 mg) under the skin on day 1, THEN 1 pen (80 mg) on day 15., Disp: 3 each, Rfl: 0  ???  ibuprofen (MOTRIN) 600 MG tablet, Take 1 tablet (600 mg total) by mouth every six (6) hours as needed for pain., Disp: 30 tablet, Rfl: 2  ???  insulin aspart protamine-insulin aspart (NOVOLOG 70/30) 100 unit/mL (70-30) injection pen, Inject 0.45 mL (45 Units total) under the skin two (2) times a day., Disp: 30 mL, Rfl: 11  ???  lancing device Misc, USE 4 TIMES DAILY BEFORE MEALS AND NIGHTLY ( USAR CUATRO VECES AL DIA SPBM Y TODAS LAS NOCHES ), Disp: 1 each, Rfl: 1  ???  losartan (COZAAR) 25 MG tablet, Take 1 tablet (25 mg total) by mouth daily., Disp: 90 tablet, Rfl: 3  ???  metFORMIN (GLUCOPHAGE) 1000 MG tablet, Take 1 tablet (1,000 mg total) by mouth 2 (two) times a day with meals., Disp: 180 tablet, Rfl: 3  ???  omeprazole (PRILOSEC) 20 MG capsule, Take 1 capsule (20 mg total) by mouth daily., Disp: 90 capsule, Rfl: 3  ???  pen needle, diabetic (ULTICARE PEN NEEDLE) 32 gauge x 5/32 (4 mm) Ndle, Use as directed two (2) times a day, Disp: 100 each, Rfl: 12  ???  semaglutide 7 mg Tab, Take 7 mg by mouth daily., Disp: 30 tablet, Rfl: 2  ???  traZODone (DESYREL) 50 MG tablet, Take 1 tablet (50 mg total) by mouth nightly., Disp: 90 tablet, Rfl: 3  No current facility-administered medications for this visit.    Facility-Administered Medications Ordered in Other Visits:   ???  matrix hemostatic sealant (FLOSEAL) topical, , , ,   ???  matrix hemostatic sealant (FLOSEAL) topical, , , ,     Interview     Ms. Hlavac reported she has been worried about her memory for the last two years or so. She worries because at times she cannot remember what she is going to do, or she cannot find some items. She worries that she is becoming ???crazy.??? Her daughter added that she and her sister (both Ms. Pelland???s daughters) noticed similar things, that the patient constantly forgets what she is doing, forgets where she leaves things, confuses dates and times, and otherwise gets information confused or forgets. She family wants to know more about whether this is an early stage of Alzheimer???s disease, versus normal aging, her other medical issues, etc.     Ms. Brent denied a history of depression earlier in life, although starting three years ago (a year after she lost her husband), she ???felt sadness.??? She got on medication for depression, and stated she is not depressed anymore, although she does have times when she feels down. With her daughter prompting, she admitted to several symptoms of depression (which also correlates with her severe inability to sleep since losing her husband). She tries to keep going by relaxing with her cockatoos, taking a nap, or doing some chores.     Ms. Yoshida reported she always feels tired, and has poor balance at times. She had a fall one year ago, and did not have the strength to stand up. For 3-4 months she has had pain in her food. She also described pain in other areas including her knees and back. She even noted a new pain in the muscles in her right arm starting yesterday. She has trouble sleeping at night, getting only about 3 hours of sleep nightly for the last 3 years. She described her symptom history in detail, especially her various pains. She noted she can only see things up close, and she has no peripheral vision. She also cannot hear out of her right ear. She also had headaches and vertigo. On some days, her symptoms are better, but lately things have been getting worse. She denied use of tobacco, alcohol, or recreational drugs, and caffeine is limited to one small coffee daily. Family history was negative for neurologic illness but did include kidney disease.     Ms. Mcdowell has been getting disability benefits for four years. Before that, she worked at [employer], Designer, television/film set. She stopped working due to vision and pain issues. Growing up in [place], she finished elementary school, but dropped out of school in the 6th grade, as she got married around then. 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 She moved to the [place] about 43 years ago, when  she was about 61 years old.     Evaluation Results     Behavioral Observations:  Ms. Fantroy was accompanied to the appointment by her daughter. She was fully oriented and alert. Her speech was fluent and normal in prosody and tone, and she comprehended all questions and instructions and focused on the evaluation well. The interview was done with use of a Spanish interpreter, although the interpreter seemed to misunderstand some things that were said, so Ms. Empanada???s daughter stepped in to translate at times. Testing was done in [place]. Thought processes were logical and she was candid during interview. She was grimacing in pain when walking to the office, but also at different times during the evaluation, including when moving her arms to use a pen. Still, she persisted well.     Performance Validity:  Tests administered were all normed and validated for use in Spanish with native Spanish speakers, with norms of subjects with similar age and education to Ms. Riles. Behavioral effort appeared adequate, and Ms. Empanada focused on tests at hand and took her time to think of answers. Indicators of performance validity embedded in clinical tests were within expectations. Clinical test scores were realistic for her Altogether, the current test results are considered an adequate measure of Ms. Empanada???s current cognitive functioning.     Test Results:    Orientation: Orientation was fully intact to place and time.    Memory: Memory was variable but with generally intact orientation. Ms. Racey???s initial learning of unstructured verbal information (a word list) was average for her age and education across learning trials. After a delay, free recall was exceptionally low, as she could not recall anything without a reminder, although she did recall words from other tasks, showing more source confusion rather than amnesia. Recall was below average with category cues, and yes/no recognition was average. Recall for visual information (a complex figure she had copied earlier) was below average after a delay.    Executive functions, processing speed, and attention: Auditory attention/working memory (Digit Span) was below average on a simple trial, but with more working memory involvement, performance was average. Visual working memory was below, although this could have been affected by vision. Executive verbal fluency measures showed below average letter fluency and below average category fluency.    Language: Conversational and receptive abilities were normal, with no clear difficulty. As noted above, verbal fluency measures were below average. Confrontation naming scored below average although this was due to missing just one item. Screening of phrase repetition was average, but auditory comprehension for commands scored exceptionally low.     Visuospatial/visuoconstruction: Ms. Ibach???s copy of a figure was accurate and scored average for her age and education.     Mood: A self-report measure of depression symptoms was in the severe range.     Informant Report: Ms. Colee???s daughter completed a questionnaire about her ability to manage instrumental activities of daily living, which indicated moderate impairment, needing assistance in most things.       Table of Scores      TEST SCORES:   Note: This summary of test scores accompanies the interpretive report and should not be considered in isolation without reference to the appropriate sections in the text. Descriptors are based on appropriate normative data and may be adjusted based on clinical judgment. The terms ???impaired??? and ???within normal limits (WNL)??? are used when a more specific level of functioning cannot be determined.      Orientation:  Raw Score Percentile     NEUROPSI Orientation 6 of 6 >50 Intact           Memory:                 NEUROPSI Raw Score Percentile     Word List           List Learning 5 of 6 50 Average       Delayed Recall 0 of 6 <1 Exceptionally Low       Cued Recall 2 of 6 16 Below Average   Recognition 6 of 6 50 Average   Visual Memory       Figure Recall 6.5 of 12 16 Below Average           Attention/Executive Function:                 NEUROPSI Raw Score Percentile     Similarities 2 of 6 16 Exceptionally High   Calculations 2 of 3 32 Average           [facility] en [place]ol  T Score Percentile     Digits Forward 35 9 Below Average   Digits Backward 52 55 Average   Spatial Span Forward 39 14 Below Average   Verbal Fluency - Letters 47 37 Average           Language:                 NEUROPSI Raw Score Percentile     Confrontation Naming 7 of 8 9 Below Average   Repetition 4 of 4 50 Average   Comprehension 3 of 6 <1 Exceptionally Low   Category Fluency 11 16 Below Average   Letter Fluency 5 16 Below Average           Visuospatial/Visuoconstruction:                 NEUROPSI Raw Score Percentile        Figure Copy 10.5 50 Average           Mood and Personality:                   Raw Score Percentile     Geriatric Depression Scale: 24 --- Severe           Additional Questionnaires:           Raw Score Percentile     Functional Activities Questionnaire (FAQ): 25 --- Moderate Impairment in IADLs         Informed Consent and Coding/Compliance    The patient was provided with a verbal description of the nature and purpose of the present neuropsychological evaluation. Also reviewed were the foreseeable risks and/or discomforts and benefits of the procedure, limits of confidentiality, and mandatory reporting requirements of this provider. The patient was given the opportunity to ask questions and receive answers about the evaluation. Oral consent to participate was provided by the patient.     This evaluation was conducted by Dwana Curd, Ph.D., ABPP(CN). Dr. Tiburcio Pea spent a total of 170 minutes in record review, interview, interpretation, report writing, and providing feedback, billed as one unit [phone] and two units 96133. she also spent 60 minutes scoring and administering tests, billed one unit [phone] and one unit [phone].

## 2021-06-22 DIAGNOSIS — H547 Unspecified visual loss: Principal | ICD-10-CM

## 2021-06-22 DIAGNOSIS — H9190 Unspecified hearing loss, unspecified ear: Principal | ICD-10-CM

## 2021-06-22 DIAGNOSIS — R5383 Other fatigue: Principal | ICD-10-CM

## 2021-06-22 DIAGNOSIS — M2550 Pain in unspecified joint: Principal | ICD-10-CM

## 2021-06-22 NOTE — Unmapped (Signed)
[name] called to request verbal orders for physical therapy. x2 days for 4 weeks and x1 day for 5 weeks. Also patient needs a prescription for a rolling walker and referral to rheumatology for joint pain and swelling. Please review and give [name] a call back at [phone]

## 2021-06-22 NOTE — Unmapped (Signed)
Patient daughter call requesting hfu; severe arthritis. First available appt is July 06, 2021. Patient stated she needs something sooner bc she needs a walker. There are no sooner appts. Please call patient to adviser

## 2021-06-24 DIAGNOSIS — M2550 Pain in unspecified joint: Principal | ICD-10-CM

## 2021-06-28 NOTE — Unmapped (Signed)
Call from daughter on nurse line.  LVM in [place] stating patient is having joint pains, swelling of joints, skin burning,cough, and occasional sore throat since starting Humira.  Thinks it may be side effects and daughter requests a substitute for Humira.

## 2021-06-29 DIAGNOSIS — M2550 Pain in unspecified joint: Principal | ICD-10-CM

## 2021-06-29 NOTE — Unmapped (Signed)
Daughter will have pt call back. Unavailable at the moment.

## 2021-06-29 NOTE — Unmapped (Signed)
DME Rolling walker

## 2021-06-29 NOTE — Unmapped (Signed)
Can you call this patient to see if she can go to see Fajgenbaum at [facility] tomorrow at 1015

## 2021-06-30 ENCOUNTER — Ambulatory Visit
Admit: 2021-06-30 | Discharge: 2021-07-01 | Payer: PRIVATE HEALTH INSURANCE | Attending: Student in an Organized Health Care Education/Training Program | Primary: Student in an Organized Health Care Education/Training Program

## 2021-06-30 DIAGNOSIS — M2550 Pain in unspecified joint: Principal | ICD-10-CM

## 2021-06-30 DIAGNOSIS — Z79899 Other long term (current) drug therapy: Principal | ICD-10-CM

## 2021-06-30 DIAGNOSIS — L732 Hidradenitis suppurativa: Principal | ICD-10-CM

## 2021-06-30 NOTE — Unmapped (Signed)
Spoke to [facility], verbal order for OT provided.  Therapist verbalized understanding, agreeable with plan of care.    11:56 AM

## 2021-06-30 NOTE — Unmapped (Signed)
Dermatology Note     Assessment and Plan:      Hidradenitis suppurativa  - Previously improved after addition of Humira after failing doxycyline (lack of efficacy), spironolactone (side effect of elevated blood sugars) and clindamycin swabs.   - HS currently very well-controlled with no active lesions on exam today.  - She has been prescribed humira since February 2022 with no issues until the recent 3 weeks of joint pains, shortness of breath, malaise and occasional chest pains. Outside workup was unrevealing.  - Patient's joint pains flared in the setting of missing few weeks of humira and now are improving since restarting. Discussed that we think it is very unlikely that her recent symptoms/arthralgias are associated with the humira. Given the timeline, humira was probably helping with joint pains. Discussed with patient, daughter, and interpreter that patient could discontinue the humira given all that is going on. Patient decided to continue humira for now.  - Informational handout on HS included in AVS.    High risk medication use (humira)  - Quant gold & hepatitis labs up to date (06/09/21)    Arthralgia, joint/chest wall tenderness to palpation  - Reviewed workup performed 06/17/21 at [facility]: EKG, troponins, ANA, dsDNA, RF, thyroid studies, uric acid, HIV, covid, flu, lactate, CXR, CTA, MRI C spine, and other basic labs which were all unremarkable. Symptoms primarily seem musculoskeletal, but discussed with patient that she will need to be evaluated by PCP & rheumatology. Referral to rheumatology placed 06/24/21 by [facility] FNP. [facility] phone # & address included in AVS.  - Patient's symptoms have improved with PT and restarting humira, but have not resolved and she continues to have joint pain and appears uncomfortable.  - Recommend patient either go to ED for evaluation or see her PCP within 24 hrs. Patient and daughter voiced understanding.    The patient was advised to call for an appointment should any new, changing, or symptomatic lesions develop.     RTC: Return in about 11 weeks (around 09/15/2021) for Next scheduled follow up w/ Dr. Mingo Amber. or sooner as needed   _________________________________________________________________      Chief Complaint     Chief Complaint   Patient presents with   ??? HS     Flared in groin area using humira stop using for 2 weeks and it got better since taking the shot for two mondays       HPI     Anne Barber is a 61 y.o. female who presents as a returning patient (last seen 06/09/2021) to Dermatology for follow up of HS . At [facility] she was restarted on humira 40 weekly and continued on clindamycin swabs. She was advised to start nystatin for intertrigo in the inguinal and abdominal folds.    She has been prescribed humira since February 2022 with no issues. She recently ran out of her humira for a couple weeks due to need for labs/PA. During that time, she developed joint pains, shortness of breath, malaise and occasional chest pains. She presented to the [facility] ED 06/17/21 (see care everywhere). She had a very thorough work up there - EKG, troponins, ANA, dsDNA, RF, thyroid studies, uric acid, HIV, covid, flu, lactate, CXR, CTA and other basic labs which were all unremarkable. 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 Sed Rate was elevated at 52. The notes state Patient is on HUMIRA... 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 Patient was treated with systemic steroids, PT (doing at home), and referral to rheumatology.    Patient has since restarted humira and been doing at-home PT and endorses some improvement in her symptoms. She and her daughter (daught on the phone) are concerned that humira caused these problems. Overall, humira has been very effective for her hidradenitis and she currently has no inflammatory lesions or draining sinuses.    The patient denies any other new or changing lesions or areas of concern.     Pertinent Past Medical History     HS    Past Medical History, Family History, Social History, Medication List, Allergies, and Problem List were reviewed in the rooming section of Epic.     ROS: Other than symptoms mentioned in the HPI, no fevers, chills, or other skin complaints    Physical Examination     GENERAL: Well-appearing female in no acute distress, resting comfortably.  NEURO: Alert and oriented, answers questions appropriately  PSYCH: Normal mood and affect  RESP: No increased work of breathing  SKIN: Examination of the chest, bilateral upper extremities, bilateral lower extremities, hands, buttocks, genitalia and groin was performed  - elbows, knees, and chest wall exquisitely tender to palpation  - non-draining sinuses in the perineal area/vulva with evidence of prior HS lesions in this area    All areas not commented on are within normal limits or unremarkable  - female chaperone present      (Approved Template 12/09/2019)

## 2021-06-30 NOTE — Unmapped (Addendum)
Hidradentis Suppurativa (pronounced ???high-drad-en-eye-tis/sup-your-uh-tee-vah???) is a chronic disease of hair follicles.  The lesions occur most commonly on areas of skin-to-skin contact: under the arms (axillary area), in the groin, around the buttocks, in the region around the anus and genitals, and on the skin between and under the breasts. In women, the underarms, groin, and breast areas are most commonly affected. Men most often have HS lesions on the buttocks and under the arms and may also have HS at the back of the neck and behind and around the ears.    What does HS look and feel like?   The first thing that someone with HS notices is a tender, raised, red bump that looks like an under-the-skin pimple or boil. Sometimes HS lesions have two or more ???heads.???  In mild disease only an occasional boil or abscess may occur, but in more active disease there can be many new lesions every month.  Some abscesses can become larger and may open and drain pus.  Bleeding and increased odor can also occur. In severe disease, deeper abscesses develop and may connect with each other under the skin to form tunnel-like tracts (sinuses, fistulas).  These may drain constantly, or may temporarily improve and then usually begin draining again over time.  In people who have had sinus tracts for some time, scars form that feel like ropes under the skin. In the very worst cases, networks of sinus tracts can form deeper in the body, including the muscle and other tissues. Many people with severe HS have scars that can limit their ability to freely move their arms or legs, though this is very unlikely for most patients.     Clinicians usually classify or ???grade??? HS using the [facility] staging system according to the severity of the disease for each body location:   [place] stage I: one or more abscesses are present, but no sinus tracts have formed and no scars have developed   [name] stage II: one or more abscesses are present that resolve and recur; on sinus tract can be present and scarring is seen   [name] stage III: many abscesses and more than one sinus tract is present with extensive scars.    What causes HS?  The cause of HS is not completely understood.  It seems to be a disorder of hair follicles and often many family members are affected so genetics probably play a strong role.  Bacteria are often present and may make the disease worse, but infection does not seem to be the main cause. Hormones are also likely play a role since the condition typically starts around puberty when hair follicles under the arms and in the groin start to change.  It can sometimes flare with menstrual cycles in women as well.  In most cases it lasts for decades and starts to improve to some extent in the late 30s and 40s as long as many fistulas have not already formed.  Women are three times more likely than men to develop HS.    Other factors are known to contribute to HS flaring or becoming worse, though they are likely not the main causes. The factors most commonly associated with HS include:   Cigarette smoking - Stopping smoking will likely not cure the disease, but likely is helpful in reducing how much and how often it flares and may prevent it from getting as bad over time.   Higher weight - HS may occur even in people that are not overweight, but  it is much more common in patients that are.  There is some evidence that losing weight and eating a diet low in sugars and fats may be helpful in improving hidradenitis, though this is not helpful for everyone.  Working with a nutritionist may be an important way to help with this and is something your physician can help coordinate    Hidradenitis is not contagious.  It is not caused by a problem with personal hygiene or any other activity or behavior of those with the disease.    How can your doctor help you treat your hidradenitis?  Clinicians use both medication and surgery to treat HS. The choice of treatment--or combination of treatments--is made according to an individual patient???s needs. Clinicians consider several factors in determining the most appropriate plan for therapy:   Severity of disease - medications and some laser treatments are usually able to control disease best when fistulas are not present.  Fistulas typically require surgery.   Extent and location of disease   Chronicity (how often the lesions recur)    A number of different surgical methods have been developed that are useful for certain patients under particular circumstances. These can be done with local numbing and healing at home for some areas when disease is not too extensive with relatively brief recovery times.  In more extensive disease there may be a need for larger excisions under general anesthesia with healing time in the hospital and prolonged recovery periods for better disease control.      In addition, many medical treatments have been tried--some with more success than others. No medication is effective for all patients, and you and your doctor may have to try several different treatments or combinations of treatments before you find the treatment plan that works best for you.  The goals of therapy with medications that are either topical (used on the skin) or systemic (taken by mouth) are:  1. to clear the lesions or at least reduce their number and extent, and  2. to prevent new lesions from forming.  3. To reduce pain, drainage, and odor  Some of the types of medications commonly used are antibacterial skin washes and the topical antibiotics to prevent secondary infections and corticosteroid injections into the lesions to reduce inflammation.     Other medications that may be used include retinoids (similar to Accutane), drugs that effect how hormones and hair follicles interact, drugs that affect your immune system (such as methotrexate, adalimumab/Humira, and Remicaid/infliximab), steroids, and oral antibiotics.    Lasers that destroy hair follicles can also be helpful since they reduce the hair follicles that cause the problems.  Multiple treatments are typically required over time and there is some discomfort associated with treatment, but it is typically very fast and well-tolerated.    It is very important to realize that hidradenitis cannot usually be completely cured with any single medication or surgical procedure.  It is a disease that can be very stubborn and difficult to control, but with good treatment a lot of improvement and sometimes temporary remissions can be obtained. Poorly controlled disease can cause more fistulas to form and make managing the disease much more difficult over time so it is important to seek care to reduce major flares.  Surgery can provide a long term cure in some areas, though the disease can start again or continue in nearby areas.  A dermatologist is often the best person to help coordinate disease treatment, and sometimes other surgeons, pain specialists,  other specialists, and nutritionists may be part of the treatment team.    For severe disease, the first goal is often to reduce pain and symptoms with medicines so that the disease feels more stable. Once it's stable, we often start thinking about how to address areas that have completely gotten better with surgery if they are still causing problems.    What can you do to help your HS?  1. Stopping smoking is hard and may not fix everything, but it may be a step in the right direction.  We or your primary care physician can provide resources to help stop if you are interested.  2. Follow a healthy diet and try to achieve a healthy weight.  Some other self-help measures are:   Keep your skin cool and dry (becoming overheated and sweating can contribute to an HS flare)   To reduce the pain of cysts or nodules or to help them to drain, apply hot compresses or soak in hot water for 10 minutes at a time (use a clean washcloth or a teabag soaked in hot water)   For female patients, cotton underwear that does not have tight elastic in the groin can be helpful.  Boyshort, brief, or boxer style underwear may be a better option as friction on hair follicles in affected areas can be a major trigger in some patients.  These can be easily found on [place] or with some retailers.  Fruit of the Loom and Underworks are two brands that are sometimes recommended.    Finally, know that you are not alone. Coping with the pain and other symptoms of HS can be very difficult, so it may be helpful to connect with others who live with HS. Patient groups and networks can be sources of important information and support. Some internet resources for information and connections are provided below.    Psychologytoday.com is a resource to find psychologists and therapists that can help support you in your are     [email or web address] can help connect with sexual health resources and counselors    Resources for Information    The Hidradenitis Suppurativa Foundation: A nonprofit organized by a group of physicians interested in treating and advancing research in hidradenitis suppurativa.  This group advocates for better care and research for hidradenitis and has educational materials put together specifically for patients that have been reviewed and produced by doctors and people with hidradenitis.    You are welcome to join the [facility] for HS of the [employer] group online at https://hopeforhs.org/nctriangle/ or in-person at our regular meetings.  You can textHS to [phone] for meeting reminders or join the group online for regular updates.  This can be a great opportunity to interact and learn from other patients and help work with the HS community.  We hope to see your there!    American Academy of Dermatology  ARanked.fi    [employer]  ElevatorPitchers.de.html  NORD: IT trainer for [employer]  https://www.rarediseases.org/rare-disease-information/rare-diseases/byID/358/viewAbstract  Trials of new medications for HS  [email or web address]   [address] [place], [place] [address] ??   [phone]

## 2021-06-30 NOTE — Unmapped (Signed)
Skin disease (hidradenitis) appears well controlled today.   She is having discomfort which appears musculoskeletal, tender to palpation, she was seen in [facility] ED for this in March and thought to be arthritis, possibly flared from being off Humira.  Humira can cause a lupus like picture but no other skin changes and ANA from [facility] negative, which should have a high sensitivity for systemic lupus. There appear not to be many reports of Humira causing isolated musculoskelatal symptoms.   We discussed trying off the Humira but given her history of missing a few doses unclear whether this was helping or causing her joint symptoms, certainly her skin has improved.    She feels she has improved over the last few weeks. Discussed she needs evaluation by PCP or rheumatology and she will pursue this. She should go to ED for any new symptoms or worsening.      I saw and evaluated the patient, participating in the key portions of the service. I reviewed the resident's note and agree with the resident's findings and plan.    Rory Percy, MD

## 2021-07-01 DIAGNOSIS — M2550 Pain in unspecified joint: Principal | ICD-10-CM

## 2021-07-01 NOTE — Unmapped (Signed)
[facility] is requesting a verbal OT order for 1 w 1 and 2 w 2    Please call [phone] to discuss this order

## 2021-07-01 NOTE — Unmapped (Addendum)
Patients daughter called in advising that she has found a location for her mom to be seen for Rheumatology. She is wanting a referral sent over to this location, she stated they are in network with her moms insurance.    Location: [facility]  [address] [facility] [address]  [phone]  Fax: [phone]    Please contact the daughter to advise if this is able to be sent over.      Referral placed today at 12:17 PM  Would allow 1-2 business days for referral to be faxed to clinic  -AM

## 2021-07-08 NOTE — Unmapped (Signed)
[facility] Refill Coordination Note    Specialty Medication(s) to be Shipped:   Inflammatory Disorders: Humira    Other medication(s) to be shipped: clindamycin, flonase, novolog and cetirizine     [name], DOB: May 04, 1960  Phone: [phone] (home)       All above HIPAA information was verified with patient.     Was a Nurse, learning disability used for this call? Yes, Talia. Patient language is appropriate in [facility]    Completed refill call assessment today to schedule patient's medication shipment from the [facility] [phone]).  All relevant notes have been reviewed.     Specialty medication(s) and dose(s) confirmed: Regimen is correct and unchanged.   Changes to medications: Collin reports no changes at this time.  Changes to insurance: No  New side effects reported not previously addressed with a pharmacist or physician: None reported  Questions for the pharmacist: No    Confirmed patient received a Conservation officer, historic buildings and a Surveyor, mining with first shipment. The patient will receive a drug information handout for each medication shipped and additional FDA Medication Guides as required.       DISEASE/MEDICATION-SPECIFIC INFORMATION        For patients on injectable medications: Patient currently has 1 doses left.  Next injection is scheduled for 07/12/2021.    SPECIALTY MEDICATION ADHERENCE     Medication Adherence    Patient reported X missed doses in the last month: 0  Specialty Medication: Humira  Patient is on additional specialty medications: No        Were doses missed due to medication being on hold? No    REFERRAL TO PHARMACIST     Referral to the pharmacist: Not needed      [facility]     Shipping address confirmed in Epic.     Delivery Scheduled: Yes, Expected medication delivery date: 07/15/2021.     Medication will be delivered via Same Day Courier to the prescription address in Epic WAM.    Oretha Milch   [facility] Specialty Technician

## 2021-07-13 ENCOUNTER — Ambulatory Visit: Admit: 2021-07-13 | Discharge: 2021-07-14 | Payer: PRIVATE HEALTH INSURANCE

## 2021-07-13 DIAGNOSIS — I6523 Occlusion and stenosis of bilateral carotid arteries: Principal | ICD-10-CM

## 2021-07-13 DIAGNOSIS — K219 Gastro-esophageal reflux disease without esophagitis: Principal | ICD-10-CM

## 2021-07-13 DIAGNOSIS — L732 Hidradenitis suppurativa: Principal | ICD-10-CM

## 2021-07-13 DIAGNOSIS — M2550 Pain in unspecified joint: Principal | ICD-10-CM

## 2021-07-13 DIAGNOSIS — I10 Essential (primary) hypertension: Principal | ICD-10-CM

## 2021-07-13 DIAGNOSIS — Z794 Long term (current) use of insulin: Principal | ICD-10-CM

## 2021-07-13 DIAGNOSIS — E11319 Type 2 diabetes mellitus with unspecified diabetic retinopathy without macular edema: Principal | ICD-10-CM

## 2021-07-13 DIAGNOSIS — Z1239 Encounter for other screening for malignant neoplasm of breast: Principal | ICD-10-CM

## 2021-07-13 NOTE — Unmapped (Signed)
Assessment and Plan:     Anne Barber was seen today for follow-up.    Diagnoses and all orders for this visit:    Encounter for screening for malignant neoplasm of breast, unspecified screening modality          -     Last mammogram April, 2022: Negative.  -     Mammography screening bilateral; Future    Polyarthralgia            -   Patient with upcoming rheumatology appointment in [place] in Aug 09, 2021        -   Getting in-home PT and OT, patient has restarted Humira with injections x2--feels this has helped skin problem, has not helped with chronic pain        -   Taking ibuprofen 400 mg 4 times daily--suspect this along with 81 mg aspirin-contributing to stomach upset and easy bruising        -   Advised regular tylenol, gentle activity/stretches. Consider gabapentin, Cymbalta for pain.        - Spoke with daughter--daughter states patient has had chronic pains, cognitive decline for many years; reviewed recommendations from [facility]    --declined referral for sleep study.        - Order for rolling walker faxed to [employer] at 4:36 PM .    Type 2 diabetes mellitus with retinopathy, with long-term current use of insulin, macular edema presence unspecified, unspecified laterality, unspecified retinopathy severity (CMS-HCC)         - Adherent with 1000 mg metformin BID, 25 mg jardiance, Novolog 70/30: 45 U BID       - Discussion on change in medication regimen for DM-pt not keen on another injectable       - Sub optimal control of blood sugars. A1c: 8.2--7.5--7.6--8.3       - Daughter stated pt with low health/IT literacy for sensor       - Insurance denied oral semaglutide--will investigate with [employer]        -Could e-consult with endocrinology regarding transitioning from Novolog to long acting insulin        -Attempted to phone [facility]--VM left at 4:37 PM--oral semaglutide      Bilateral carotid artery stenosis  Primary hypertension       -Adherent with 5 mg amlodipine, 25 mg losartan, 81 mg ASA     - Cr: 0.70, K 4.2 (11/22)    Hidradenitis suppurativa        -Adherent with weekly Humira injections, clindamycin wipes       -Managed by [facility] dermatology, patient feels condition well controlled on this regimen    Gastroesophageal reflux disease, unspecified whether esophagitis present      -Suspect combination of NSAIDs, oral prednisone, aspirin contributing to stomach pain    -Discussed risks of medication and combination with each other    -Based changing to Tylenol, increase PPI to twice per day    -Discussed consideration of EGD if ongoing symptoms or no improvement.    Barriers to recommended plan:  health literacy    Return if symptoms worsen or fail to improve, for 3 meses.      Subjective:     HPI: Anne Barber is a 61 y.o. female here for Follow-up (Patient voices sugar levels have been 187-210. ).    Chronic pains:  R shoulder pain, radiates to hands, had PT x2/week, OT x2/week,  will decrease to x1/week next  Ambulating with a four-point rolling walker.  Therapist concerned as patient bruising easily   Has rheumatology appointment May, 2023-[facility]  Hospitalized 3/23-3/25 at [facility] for polyarthralgia--given prednisone taper (dsDNA, ANA rheumatoid factor: Negative)  Taking ibuprofen 400 mg QID    HS:  Back on Humira, clindamycin swabs-x 2 injections-noticed a difference in skin, no change in chronic joint pain.    DM:  Blood sugars 'variable': 180-240s  Insulin 45 U BID, metformin 1000 mg BID, 25 mg jardiance  Never started semaglutide.  Frustrated with weight gain and inability to lose weight.  Per chart review, patient has been on similar insulin regimen since 2018.  Last A1c: 8.2 (03/23)    Insomnia:  50 mg trazodone  Recent visit to [facility] in March, 2023--recommended consideration of sleep study if ongoing sleep problems, treatment of depression, and chronic pain.    Allergies:  Ceteriizine 10 mg, Flonase    HTN, high cholesterol, carotid artery stenosis:  Adherent with 5 mg amlodipine, 25 mg losartan, 81 mg ASA  Last LDL 66 (11/22), Cr 0.68, K 3.7 (03/23)    Stomach pains:  Patient with stomach discomfort, pain in upper quadrants of abdomen, epigastric area  Has had for several months.  Adherent with PPI.  No urinary symptoms, no changes in bowel regimen.    Agreeable for mammogram referral.  Here with daughter.  I have reviewed past medical, surgical, medications, allergies, social and family histories today and updated them in Epic where appropriate.    ROS:   PHQ-9 PHQ-9 TOTAL SCORE   01/29/2021   1:00 PM 9   01/20/2020   5:00 PM 7   11/05/2019   1:00 PM 11   GAD7 Total Score GAD-7 Total Score   01/29/2021   1:00 PM 3   01/20/2020   6:00 PM 7     Review of systems negative unless otherwise noted as per HPI.      Objective:     Vitals:    07/13/21 0825   BP: 130/68   Pulse: 77   Temp: 36.5 ??C (97.7 ??F)   SpO2: 99%     Body mass index is 37.7 kg/m??.    Physical Exam  Vitals and nursing note reviewed.   Constitutional:       Appearance: Normal appearance.   Cardiovascular:      Rate and Rhythm: Normal rate and regular rhythm.      Pulses: Normal pulses.      Heart sounds: Normal heart sounds.   Pulmonary:      Effort: Pulmonary effort is normal.      Breath sounds: Normal breath sounds.   Abdominal:      General: Abdomen is flat. Bowel sounds are normal.      Palpations: Abdomen is soft.      Comments: Diffuse, generalized tenderness in epigastric area of stomach  No guarding, no masses.   Musculoskeletal:      Cervical back: Normal range of motion and neck supple.      Comments: Gait steady with a four-point rolling walker.   Skin:     General: Skin is warm and dry.      Capillary Refill: Capillary refill takes less than 2 seconds.   Neurological:      General: No focal deficit present.      Mental Status: She is alert and oriented to person, place, and time. Mental status is at baseline.   Psychiatric:  Mood and Affect: Mood normal.         Behavior: Behavior normal.         Thought Content: Thought content normal.         Judgment: Judgment normal.          Medication adherence and barriers to the treatment plan have been addressed. Opportunities to optimize healthy behaviors have been discussed. Patient / caregiver voiced understanding.   I personally spent 30 minutes face-to-face and non-face-to-face in the care of this patient, which includes all pre, intra, and post visit time on the date of service.  Cleon Dew, DNP, FNP-C  [facility]  [phone] (F)    Note - This record has been created using [employer]. Chart creation errors have been sought, but may not always have been located. Such creation errors do not reflect on the standard of medical care.

## 2021-07-13 NOTE — Unmapped (Signed)
Dolor en su estomago: omeprazole 1 pastilla dos veces al dia por 2 meses  Menos ibuprofen, tylenol 650 mg tres veces al dia. Piense sobre cymbalta o gabapentin.    Comenzar semaglutide para sus Production assistant, radio y [place].

## 2021-07-13 NOTE — Unmapped (Signed)
Patients daughter is calling in stating that the walker that is needing to be ordered for her moms insurance to cover.    Stated needed to be labeled as Rolling walker with chair    Stated it can be faxed directly to the medical supply store.    Store: [facility]  Fax: [phone]    Please assist when possible.

## 2021-07-15 DIAGNOSIS — Z794 Long term (current) use of insulin: Principal | ICD-10-CM

## 2021-07-15 DIAGNOSIS — E11319 Type 2 diabetes mellitus with unspecified diabetic retinopathy without macular edema: Principal | ICD-10-CM

## 2021-07-15 MED FILL — FLUTICASONE PROPIONATE 50 MCG/ACTUATION NASAL SPRAY,SUSPENSION: NASAL | 30 days supply | Qty: 16 | Fill #2

## 2021-07-15 MED FILL — NOVOLOG MIX 70-30 FLEXPEN U-100 INSULIN 100 UNIT/ML SUBCUTANEOUS PEN: SUBCUTANEOUS | 33 days supply | Qty: 30 | Fill #8

## 2021-07-15 MED FILL — CETIRIZINE 10 MG TABLET: ORAL | 30 days supply | Qty: 30 | Fill #1

## 2021-07-15 MED FILL — CLINDAMYCIN PHOSPHATE 1 % TOPICAL SWAB: TOPICAL | 30 days supply | Qty: 60 | Fill #1

## 2021-07-15 MED FILL — HUMIRA PEN CITRATE FREE 40 MG/0.4 ML: SUBCUTANEOUS | 28 days supply | Qty: 4 | Fill #1

## 2021-07-15 NOTE — Unmapped (Signed)
Spoke to daughter--agreeable to e-consult with endocrinology--change medication regimen as pt taking same medications for many years with inadequate results  Daughter agreeable to e-consult, will update as needed/indicate.    Verbalized understanding, agreeable with plan.

## 2021-07-18 DIAGNOSIS — Z794 Long term (current) use of insulin: Principal | ICD-10-CM

## 2021-07-18 DIAGNOSIS — E11319 Type 2 diabetes mellitus with unspecified diabetic retinopathy without macular edema: Principal | ICD-10-CM
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
## 2021-07-21 DIAGNOSIS — M2550 Pain in unspecified joint: Principal | ICD-10-CM

## 2021-07-21 DIAGNOSIS — M792 Neuralgia and neuritis, unspecified: Principal | ICD-10-CM

## 2021-07-21 MED ORDER — DULOXETINE 20 MG CAPSULE,DELAYED RELEASE
ORAL_CAPSULE | Freq: Every day | ORAL | 1 refills | 30 days | Status: CP
Start: 2021-07-21 — End: 2022-07-21
  Filled 2021-07-22: qty 30, 30d supply, fill #0

## 2021-07-21 NOTE — Unmapped (Signed)
8:46 AM     Spoke to patient's daughter--reviewed endocrinology recommendations.  Daughter thinks patient would prefer once daily basal insulin and weekly GLP-a.  Discussed current insulin regimen is 45 units twice daily, daughter will verify dose with patient and get back to provider regarding dosing.      Patient continues to complain of leg pain, skin pain--worse at night.  Patient receiving home physical therapy services and received rollator walker which is helping with ambulation.    Daughter agreeable to trial of Cymbalta 20 mg p.o. daily, prescription sent to [facility] pharmacy, referral to [facility] neurology.    Daughter to message or call clinic regarding dose of 70/30 insulin.  Daughter verbalized understanding, agreeable with plan of care.

## 2021-07-26 DIAGNOSIS — Z794 Long term (current) use of insulin: Principal | ICD-10-CM

## 2021-07-26 DIAGNOSIS — E11319 Type 2 diabetes mellitus with unspecified diabetic retinopathy without macular edema: Principal | ICD-10-CM

## 2021-07-26 MED ORDER — INSULIN GLARGINE (U-100) 100 UNIT/ML (3 ML) SUBCUTANEOUS PEN
Freq: Every evening | SUBCUTANEOUS | 12 refills | 30 days | Status: CP
Start: 2021-07-26 — End: 2022-07-26
  Filled 2021-07-26: qty 15, 30d supply, fill #0

## 2021-07-26 MED ORDER — TRULICITY 0.75 MG/0.5 ML SUBCUTANEOUS PEN INJECTOR
SUBCUTANEOUS | 1 refills | 28 days | Status: CP
Start: 2021-07-26 — End: ?
  Filled 2021-08-19: qty 2, 28d supply, fill #0

## 2021-07-26 NOTE — Unmapped (Signed)
Spoke to daughter and informed of following changes:    When able to get medication from pharmacy--stop BID 70/30 insulin( pt was taking 45 U in AM, 35 U in PM).    Start once per day long acting insulin: 50 U at night, start once weekly GLP1-a: trulicity or ozempic--start at lowest dose--1 injection per week, if tolerating dose can increase dose in 4-8 weeks.    Continue metformin and jardiance    Daughter verbalized understanding, agreeable with plan of care--aware can call/message clinic if has additional questions.

## 2021-08-03 ENCOUNTER — Ambulatory Visit: Admit: 2021-08-03 | Discharge: 2021-08-04 | Payer: PRIVATE HEALTH INSURANCE

## 2021-08-05 MED ORDER — PREDNISONE 20 MG TABLET
ORAL_TABLET | Freq: Every day | ORAL | 0 refills | 30 days
Start: 2021-08-05 — End: ?

## 2021-08-06 NOTE — Unmapped (Signed)
[facility] Refill Coordination Note    Specialty Medication(s) to be Shipped:   Inflammatory Disorders: Humira    Other medication(s) to be shipped: clindamycin, flonase, metformin, nortriptyline, trazodone and cetirizine     Anne Barber, DOB: 08-17-1960  Phone: [phone] (home)       All above HIPAA information was verified with patient's family member, Anne Barber.     Was a Nurse, learning disability used for this call? No    Completed refill call assessment today to schedule patient's medication shipment from the [facility] [phone]).  All relevant notes have been reviewed.     Specialty medication(s) and dose(s) confirmed: Regimen is correct and unchanged.   Changes to medications: Kaaren reports no changes at this time.  Changes to insurance: No  New side effects reported not previously addressed with a pharmacist or physician: None reported  Questions for the pharmacist: No    Confirmed patient received a Conservation officer, historic buildings and a Surveyor, mining with first shipment. The patient will receive a drug information handout for each medication shipped and additional FDA Medication Guides as required.       DISEASE/MEDICATION-SPECIFIC INFORMATION        For patients on injectable medications: Patient currently has 1 doses left.  Next injection is scheduled for 08/09/2021.    SPECIALTY MEDICATION ADHERENCE     Medication Adherence    Patient reported X missed doses in the last month: 0  Specialty Medication: Humira  Patient is on additional specialty medications: No  Any gaps in refill history greater than 2 weeks in the last 3 months: no  Demonstrates understanding of importance of adherence: yes  Informant: child/children  Reliability of informant: reliable  Confirmed plan for next specialty medication refill: delivery by pharmacy  Refills needed for supportive medications: not needed        Were doses missed due to medication being on hold? No    REFERRAL TO PHARMACIST Referral to the pharmacist: Not needed      [facility]     Shipping address confirmed in Epic.     Delivery Scheduled: Yes, Expected medication delivery date: 08/11/2021.     Medication will be delivered via Same Day Courier to the prescription address in Epic WAM.    Angelea Penny D Tilmon Wisehart   [facility] Specialty Technician

## 2021-08-11 MED FILL — NORTRIPTYLINE 10 MG CAPSULE: ORAL | 15 days supply | Qty: 30 | Fill #2

## 2021-08-11 MED FILL — METFORMIN 1,000 MG TABLET: ORAL | 90 days supply | Qty: 180 | Fill #2

## 2021-08-11 MED FILL — TRAZODONE 50 MG TABLET: ORAL | 90 days supply | Qty: 90 | Fill #2

## 2021-08-11 MED FILL — CETIRIZINE 10 MG TABLET: ORAL | 30 days supply | Qty: 30 | Fill #2

## 2021-08-11 MED FILL — HUMIRA PEN CITRATE FREE 40 MG/0.4 ML: SUBCUTANEOUS | 28 days supply | Qty: 4 | Fill #2

## 2021-08-11 MED FILL — FLUTICASONE PROPIONATE 50 MCG/ACTUATION NASAL SPRAY,SUSPENSION: NASAL | 30 days supply | Qty: 16 | Fill #3

## 2021-08-11 MED FILL — CLINDAMYCIN PHOSPHATE 1 % TOPICAL SWAB: TOPICAL | 30 days supply | Qty: 60 | Fill #2

## 2021-08-13 NOTE — Unmapped (Signed)
Returned call to Tiffany from [facility] gave verbal orders   for occupational therapy 1 week 1 2 week 2.

## 2021-08-18 DIAGNOSIS — M797 Fibromyalgia: Principal | ICD-10-CM

## 2021-08-18 DIAGNOSIS — M792 Neuralgia and neuritis, unspecified: Principal | ICD-10-CM

## 2021-08-18 DIAGNOSIS — M2550 Pain in unspecified joint: Principal | ICD-10-CM

## 2021-08-18 MED ORDER — DULOXETINE 30 MG CAPSULE,DELAYED RELEASE
ORAL_CAPSULE | Freq: Every day | ORAL | 1 refills | 30 days | Status: CP
Start: 2021-08-18 — End: 2022-08-18
  Filled 2021-08-19: qty 30, 30d supply, fill #0

## 2021-08-18 MED ORDER — PEN NEEDLE, DIABETIC 32 GAUGE X 5/32" (4 MM)
Freq: Two times a day (BID) | SUBCUTANEOUS | 12 refills | 50 days | Status: CP
Start: 2021-08-18 — End: ?

## 2021-08-18 NOTE — Unmapped (Signed)
Patient is requesting the following refill  Requested Prescriptions     Pending Prescriptions Disp Refills    pen needle, diabetic (ULTICARE PEN NEEDLE) 32 gauge x 5/32 (4 mm) Ndle 100 each 12     Sig: Use as directed two (2) times a day       Recent Visits  Date Type Provider Dept   07/13/21 Office Visit Deneise Lever, FNP [facility] [address] [facility]   05/31/21 Office Visit Deneise Lever, FNP [place] [facility]   02/24/21 Office Visit Desmond Dike, NP [place] [facility]   01/29/21 Office Visit Deneise Lever, FNP [place] [facility]   11/03/20 Office Visit Deneise Lever, FNP [place] [facility]   Showing recent visits within past 365 days with a meds authorizing provider and meeting all other requirements  Future Appointments  Date Type Provider Dept   10/13/21 Appointment Deneise Lever, FNP [place] [facility] [address] [facility]   Showing future appointments within next 365 days with a meds authorizing provider and meeting all other requirements

## 2021-08-18 NOTE — Unmapped (Signed)
Patient's daughter called and stated that the rheumatologist that the patient seen did not find anything abnormal but suggested a referral to pain management for fibromyalgia. Please review and advise, thank you.

## 2021-08-18 NOTE — Unmapped (Signed)
Spoke to daughter--no rheumatological etiology for pains, was prescribing prednisone--causes blood sugars to be elevated.    Last week-pt was in pain, today-pain less able to get up and move around more.  Agreeable to increase Cymbalta from 20 mg---30 mg, educated trulicity can cause nausea.    Reviewed medications use for diabetes:    25 mg Jardiance, metformin 1000 mg twice daily  Trulicity 0.75 mg weekly  Glargine 50 units nightly      Phone was disconnected midway through conversation with daughter.  We will increase Cymbalta from 20 mg to 30 mg prescription sent to preferred pharmacy  Referral sent to physical medicine and rehab at [facility].    Total time on phone 17 minutes.

## 2021-08-19 MED FILL — ULTICARE PEN NEEDLE 32 GAUGE X 5/32" (4 MM): SUBCUTANEOUS | 50 days supply | Qty: 100 | Fill #0

## 2021-08-19 MED FILL — INSULIN GLARGINE (U-100) 100 UNIT/ML (3 ML) SUBCUTANEOUS PEN: SUBCUTANEOUS | 30 days supply | Qty: 15 | Fill #1

## 2021-08-31 DIAGNOSIS — M797 Fibromyalgia: Principal | ICD-10-CM

## 2021-08-31 DIAGNOSIS — M2550 Pain in unspecified joint: Principal | ICD-10-CM

## 2021-08-31 DIAGNOSIS — M792 Neuralgia and neuritis, unspecified: Principal | ICD-10-CM

## 2021-08-31 NOTE — Unmapped (Signed)
Patient's daughter, [name], called to follow up on referral for pain management.     Please call back to assist further.

## 2021-09-02 ENCOUNTER — Ambulatory Visit: Admit: 2021-09-02 | Discharge: 2021-09-03 | Payer: PRIVATE HEALTH INSURANCE

## 2021-09-02 DIAGNOSIS — R059 Cough, unspecified type: Principal | ICD-10-CM

## 2021-09-02 DIAGNOSIS — E113513 Type 2 diabetes mellitus with proliferative diabetic retinopathy with macular edema, bilateral: Principal | ICD-10-CM

## 2021-09-02 MED ORDER — CETIRIZINE 10 MG TABLET
ORAL_TABLET | Freq: Every day | ORAL | 2 refills | 30 days | Status: CP
Start: 2021-09-02 — End: 2022-09-02
  Filled 2021-10-13: qty 30, 30d supply, fill #0

## 2021-09-02 NOTE — Unmapped (Signed)
Patient is requesting the following refill  Requested Prescriptions     Pending Prescriptions Disp Refills    cetirizine (ZYRTEC) 10 MG tablet 30 tablet 2     Sig: Take 1 tablet (10 mg total) by mouth daily.       Recent Visits  Date Type Provider Dept   07/13/21 Office Visit Deneise Lever, FNP [place] [facility] [address] [facility]   05/31/21 Office Visit Deneise Lever, FNP [place] [facility]   02/24/21 Office Visit Desmond Dike, NP [place] [facility]   01/29/21 Office Visit Deneise Lever, FNP [place] [facility]   11/03/20 Office Visit Deneise Lever, FNP [place] [facility]   Showing recent visits within past 365 days with a meds authorizing provider and meeting all other requirements  Future Appointments  Date Type Provider Dept   10/13/21 Appointment Deneise Lever, FNP [place] [facility] [address] [facility]   Showing future appointments within next 365 days with a meds authorizing provider and meeting all other requirements

## 2021-09-02 NOTE — Unmapped (Signed)
I was immediately available. I discussed all elements of findings, assessment, and plan with the resident and agree with the findings and plan as documented in the resident's note.
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
## 2021-09-13 DIAGNOSIS — Z794 Long term (current) use of insulin: Principal | ICD-10-CM

## 2021-09-13 DIAGNOSIS — E11319 Type 2 diabetes mellitus with unspecified diabetic retinopathy without macular edema: Principal | ICD-10-CM

## 2021-09-13 DIAGNOSIS — E113513 Type 2 diabetes mellitus with proliferative diabetic retinopathy with macular edema, bilateral: Principal | ICD-10-CM

## 2021-09-13 MED ORDER — FREESTYLE LIBRE 2 SENSOR KIT
11 refills | 0 days | Status: CP
Start: 2021-09-13 — End: ?

## 2021-09-13 NOTE — Unmapped (Signed)
Patient is requesting the following refill  Requested Prescriptions     Pending Prescriptions Disp Refills    flash glucose sensor (FREESTYLE LIBRE 2 SENSOR) kit 3 each 11     Sig: by Other route every ten (10) days.       Recent Visits  Date Type Provider Dept   07/13/21 Office Visit Deneise Lever, FNP [place] [facility] [address] [facility]   05/31/21 Office Visit Deneise Lever, FNP [place] [facility]   02/24/21 Office Visit Desmond Dike, NP [place] [facility]   01/29/21 Office Visit Deneise Lever, FNP [place] [facility]   11/03/20 Office Visit Deneise Lever, FNP [place] [facility]   Showing recent visits within past 365 days with a meds authorizing provider and meeting all other requirements  Future Appointments  Date Type Provider Dept   10/13/21 Appointment Deneise Lever, FNP  [facility] [address] [facility]   Showing future appointments within next 365 days with a meds authorizing provider and meeting all other requirements

## 2021-09-13 NOTE — Unmapped (Signed)
[facility] Refill Coordination Note    Specialty Medication(s) to be Shipped:   Inflammatory Disorders: Humira    Other medication(s) to be shipped: amlodipine  Aspirin  Atorvastatin  Duloxetine  jardiance  Losartan  trulicity       Anne Barber, DOB: Aug 12, 1960  Phone: [phone] (home)       All above HIPAA information was verified with patient's family member, yoana.     Was a Nurse, learning disability used for this call? No    Completed refill call assessment today to schedule patient's medication shipment from the [facility] [phone]).  All relevant notes have been reviewed.     Specialty medication(s) and dose(s) confirmed: Regimen is correct and unchanged.   Changes to medications: Aarti reports no changes at this time.  Changes to insurance: No  New side effects reported not previously addressed with a pharmacist or physician: None reported  Questions for the pharmacist: No    Confirmed patient received a Conservation officer, historic buildings and a Surveyor, mining with first shipment. The patient will receive a drug information handout for each medication shipped and additional FDA Medication Guides as required.       DISEASE/MEDICATION-SPECIFIC INFORMATION        For patients on injectable medications: Patient currently has 0 doses left.  Next injection is scheduled for 6/26.    SPECIALTY MEDICATION ADHERENCE     Medication Adherence    Patient reported X missed doses in the last month: 0  Specialty Medication: Humira  Patient is on additional specialty medications: No  Informant: child/children  Reliability of informant: reliable  Confirmed plan for next specialty medication refill: delivery by pharmacy  Refills needed for supportive medications: not needed              Were doses missed due to medication being on hold? No      REFERRAL TO PHARMACIST     Referral to the pharmacist: Not needed      [facility]     Shipping address confirmed in Epic.     Delivery Scheduled: Yes, Expected medication delivery date: 6/21.     Medication will be delivered via Same Day Courier to the prescription address in Epic WAM.    Westley Gambles   [facility] Specialty Technician

## 2021-09-14 DIAGNOSIS — Z794 Long term (current) use of insulin: Principal | ICD-10-CM

## 2021-09-14 DIAGNOSIS — E11319 Type 2 diabetes mellitus with unspecified diabetic retinopathy without macular edema: Principal | ICD-10-CM

## 2021-09-14 MED ORDER — FREESTYLE LIBRE 2 READER
Freq: Once | 11 refills | 0 days | Status: CP
Start: 2021-09-14 — End: 2021-09-15

## 2021-09-14 MED FILL — ASPIRIN 81 MG TABLET,DELAYED RELEASE: ORAL | 150 days supply | Qty: 150 | Fill #2

## 2021-09-14 MED FILL — TRULICITY 0.75 MG/0.5 ML SUBCUTANEOUS PEN INJECTOR: SUBCUTANEOUS | 28 days supply | Qty: 2 | Fill #1

## 2021-09-14 MED FILL — AMLODIPINE 5 MG TABLET: ORAL | 90 days supply | Qty: 90 | Fill #1

## 2021-09-14 MED FILL — DULOXETINE 30 MG CAPSULE,DELAYED RELEASE: ORAL | 30 days supply | Qty: 30 | Fill #1

## 2021-09-14 MED FILL — ATORVASTATIN 40 MG TABLET: ORAL | 90 days supply | Qty: 90 | Fill #3

## 2021-09-14 MED FILL — JARDIANCE 25 MG TABLET: ORAL | 90 days supply | Qty: 90 | Fill #1

## 2021-09-14 MED FILL — HUMIRA PEN CITRATE FREE 40 MG/0.4 ML: SUBCUTANEOUS | 28 days supply | Qty: 4 | Fill #3

## 2021-09-14 MED FILL — LOSARTAN 25 MG TABLET: ORAL | 90 days supply | Qty: 90 | Fill #1

## 2021-09-14 NOTE — Unmapped (Signed)
Dermatology Note     Assessment and Plan:      Hidradenitis suppurativa, Anne Barber I or II   - Previously improved after addition of Humira after failing doxycyline (lack of efficacy), spironolactone (side effect of elevated blood sugars) and clindamycin swabs.   - HS currently very well-controlled with no active lesions on exam today with addition of Humira.  - She has been prescribed humira since February 2022 with no issues until the recent 3 weeks of joint pains, shortness of breath, malaise and occasional chest pains. Outside workup was unrevealing.  - Patient endorses muscle pain and weakness since missing a Humira dose in February. The patient was seen by an outside rheumatologist at [facility] who thought she had PMR vs Fibromyalgia. The patient was extremely uncomfortable on exam today and in immense pain. She was noted to have inflammatory lesions on her hands that are supposedly transient. As such, placed an urgent referral to [facility]. Would love to get their expertise and input regarding this patient, as there is much more going on then her skin disease.  - Continue Humira 40 mg weekly   - Referrals placed for [facility] and [facility]     High risk medication use (humira)  - Quant gold & hepatitis labs up to date (06/09/21)    The patient was advised to call for an appointment should any new, changing, or symptomatic lesions develop.     RTC: Return in about 6 months (around 03/17/2022). or sooner as needed   _________________________________________________________________      Chief Complaint     Chief Complaint   Patient presents with   ??? Follow-up     Pt states HS is remaining the same.        HPI     Anne Barber is a 61 y.o. female who presents as a returning patient (last seen 06/30/2021) to Dermatology for follow up of HS . She was re-started on Humira approximately 6 months ago. Her HS lesions cleared up. She is currently dealing with immense pain since missing a dose of Humira in February. Workup with outside rheumatologist appeared negative.     The patient denies any other new or changing lesions or areas of concern.     Pertinent Past Medical History     HS    Past Medical History, Family History, Social History, Medication List, Allergies, and Problem List were reviewed in the rooming section of Epic.     ROS: Other than symptoms mentioned in the HPI, no fevers, chills, or other skin complaints    Physical Examination     GENERAL: Well-appearing female in no acute distress, resting comfortably.  NEURO: Alert and oriented, answers questions appropriately  PSYCH: Normal mood and affect  RESP: No increased work of breathing  SKIN: Examination of the chest, bilateral upper extremities, bilateral lower extremities, hands, buttocks, genitalia and groin was performed  - Red-violaceous erythematous plaques on palmar surface of L hand   - non-draining sinuses in the perineal area/vulva with evidence of prior HS lesions in this area    All areas not commented on are within normal limits or unremarkable  - female chaperone present      (Approved Template 12/09/2019)

## 2021-09-15 ENCOUNTER — Ambulatory Visit
Admit: 2021-09-15 | Discharge: 2021-09-16 | Payer: PRIVATE HEALTH INSURANCE | Attending: Student in an Organized Health Care Education/Training Program | Primary: Student in an Organized Health Care Education/Training Program

## 2021-09-15 DIAGNOSIS — L732 Hidradenitis suppurativa: Principal | ICD-10-CM

## 2021-09-15 DIAGNOSIS — Z79899 Other long term (current) drug therapy: Principal | ICD-10-CM

## 2021-09-15 DIAGNOSIS — M2550 Pain in unspecified joint: Principal | ICD-10-CM

## 2021-10-01 ENCOUNTER — Ambulatory Visit: Admit: 2021-10-01 | Discharge: 2021-10-02 | Payer: PRIVATE HEALTH INSURANCE

## 2021-10-01 DIAGNOSIS — M2550 Pain in unspecified joint: Principal | ICD-10-CM

## 2021-10-01 NOTE — Unmapped (Signed)
Rheumatology Clinic Initial Visit Note     Reason for consult: Anne Barber is seen in consultation at the request of Dr. Marylene Land for evaluation of arthralgia and myalgia.    Assessment and Plan: Anne Barber is a 61 y.o.  female with a PMH of obesity, HTN, HLD, DM with retinopathy, hidradenitis suppurativa on adalimumab, depression, and anxiety who arrives for evaluation of arthralgias and myalgias. In brief, she began developing diffuse myalgias and arthralgias in March, after missing 2 doses of adalimumab for her HS. She describes fatigue, generalized pain throughout muscles of her whole body, discomfort with light touch (such as when sheets rub on her skin), and warmth sensation in her muscles.  Her symptoms are very distressing to her.  Her joint pain does not sound inflammatory in nature.  She denies joint swelling, morning stiffness, or family history of autoimmune disease.  Prior rheumatologic work-up reviewed with +ANA (-ENA), -dsDNA, normal compliments, -RF, -CCP, and elevated inflammatory markers (nonspecific, in setting of obesity and DM).  She does not sleep well (sleeps only 4 hours nightly), is unable to exercise much due to pain, and reports low mood. Physical exam with diffuse tenderness in multiple muscle groups (pt is tearful with examination of joints) but no evidence of swelling, erythema, warmth, or synovitis of any joints.  We discussed the diagnosis of fibromyalgia at length today including pharmacologic and nonpharmacologic treatments.  We discussed importance of working with her PCP and pain management (referral in place) to improve her sleep quality as this can heighten pain sensation.  We also discussed the importance of aerobic exercise and management of comorbid anxiety and depression.    Fibromyalgia:  - She can follow up with her PCP and pain management for her fibromyalgia; Consider increasing duloxetine dose to 60 mg daily (maximal dose for pain relief)  - Referral to pain psychology placed    OVERVIEW OF TREATMENT STRATEGIES FOR FIBROMYALGIA SYNDROME     Non-Pharmacologic (without medications) Pharmacologic  (with medications)   3 main treatments for fibromyalgia  1. Good restorative sleep-- Discuss sleep hygiene, consider sleep study to evaluate other issues that may hinder sleep (like sleep apnea)  2. Regular, graded, low impact cardiovascular exercise--Walking/biking etc. Water aerobics can be particularly helpful.    3. Management of any co-morbid depression/anxiety. Consider referral to psychiatry if needed.      Other helpful options  1. Pain psychology/counseling/ cognitive behavioral therapy (CBT)  2. Mindfulness Training- Available through the [facility] Medicine clinic (patient can call to inquire or schedule appointment [phone])  3. Physical therapy/ exercise therapy   4. Massage therapy  5. Acupuncture   6. Tai Chi  7. Yoga FDA approved medications  1. Lyrica (pregabalin)   2. Cymbalta (duloxetine)  3. Savella (milnacipran)     Medications used ???off-label,??? meaning not FDA approved, but are used often for fibromyalgia  ?? SNRI Antidepressants   1. Venlafaxine  ?? Tri-cyclic antidepressants (particularly helpful for sleep)  1. Amitriptyline   2. Nortriptyline   ?? Anticonvulsants (anti-seizure)  1. Gabapentin  2. Topamax (topiramate)- rarely used, but occasionally helpful  ?? NSAID medications   ?? Muscle relaxers  ??  Analgesics  1. Tylenol  2. Lidocaine patches  3. Mentholated creams (like Icy-Hot or BioFreeze)    Tip: Fibromyalgia patients are susceptible to low vitamin D levels.  They are sometimes more sensitive to low vitamin D, which can increase fatigue and overall aching.  Supplementation of vitamin D when low can be  helpful in managing symptoms.  Opioid pain medications, like oxycodone and hydrocodone, are not recommended for the long term management of fibromyalgia pain.         Follow up: no rheumatology follow up required    Patient understands and agrees to plan above.    Patient was discussed with Dr. Evonnie Pat DO MPH  Rheumatology Fellow, PGY4    Initial HPI: Anne Barber is a 61 y.o.  female with a PMH of obesity, HTN, HLD, DM with retinopathy, hidradenitis suppurativa on adalimumab, depression, and anxiety who arrives for evaluation of arthralgias and myalgias.    She developed arthralgias and myalgias at the end of March. She has a history of hidradenitis suppurativa which was diagnosed 2 years ago, when she developed skin lesions under her abdomen. She has been on Humira since then. She began to develop significant pain after missing 2 doses of Humira in March. She describes a warmth sensation (with pain) in the anterior and posterior aspect of her knees, elbows, and arms. She states bed sheets felt uncomfortable on her skin. She feels like her muscles loosen up at times and she falls over due to instability. She reports pain in the shoulders, elbows, and hands as well. She is unable to lift her arms up to comb hair due to pain. She is unable to make a fist. She had transient numbness in her jaw. Sometimes she is unable to move and unable to get out of her bed. She feels all over muscular pain, which lasts for 3-4 days, and then improves on his own. She sometimes gets chest pain.    She was evaluated in [facility] ER during the 1st week of April, although they could not pinpoint the cause of her symptoms. She was subsequently referred for rheumatology evaluation, where she was thought to have fibromyalgia vs. PMR. She was initially prescribed steroids, although she never took them.     She states that no particular time of day where her pain is worse. She is unsure if she has joint swelling. She takes ibuprofen 600 mg every 6 hours prn for pain.    She does not sleep well. She sleeps an average of 4 hours nightly. She tosses and turns at night. She takes trazodone 50 mg nightly. She also takes duloxetine 30 mg nightly, which helps her sleep some. She walks on the treadmill for 10 minutes per day 1-2 times per week. She has been feeling depressed.     ROS:  - Constitutional: No fevers, +chills in the evening, +fatigue, +night sweats, +weight loss (10 lb in 1 month of intentional weight loss)  - Eyes: No pain, redness, or dry eyes. +decreased vision (glaucoma), +decreased peripheral vision, +retinopathy (chronic, from DM)  - HENT: No congestion, mouth/nasal sores, dry mouth, +decreased hearing in both ears  - Respiratory: No cough, shortness of breath, or wheezing  - Cardiovascular: No palpitations, +chest pain  - Gastrointestinal: No vomiting, abdominal pain, constipation, melena, or hematochezia; +nausea, +diarrhea (3-4x monthly)  - Genitourinary: No dysuria or hematuria  - Musculoskeletal: +Arthralgia, +neck pain  - Skin: No malar rash, psoriasis, sclerodactyly; No alopecia, nail pitting, or nail changes; No Raynaud phenomenon. +Easy bruising.  - Neurological: No dizziness, tingling, focal weakness. +Numbness in the hands, +intermittent headaches     Labs:  - 05/2021: -ANA, -dsDNA  - 07/2021: +ANA >1:2560, -ENA, -dsDNA, normal compliments, -RF, -CCP, ESR 45, CRP 15    No family hx  related to rheumatology noted    Allergies: Patient has no known allergies.  Immunizations:   Immunization History   Administered Date(s) Administered   ??? COVID-19 VAC,BIVALENT(44YR UP),PFIZER 05/08/2021   ??? COVID-19 VAC,BIVALENT(5-28YR),PFIZER 05/08/2021   ??? COVID-19 VACC,MRNA,(PFIZER)(PF) 05/08/2020   ??? INFLUENZA INJ MDCK PF, QUAD,(FLUCELVAX)(26MO AND UP EGG FREE) 01/20/2020, 01/29/2021   ??? Influenza Vaccine Quad (IIV4 PF) 18mo+ injectable 05/23/2017, 11/29/2017   ??? PNEUMOCOCCAL POLYSACCHARIDE 23 06/25/2014   ??? TdaP 06/25/2014      PMHx:   Past Medical History:   Diagnosis Date   ??? Anisocoria    ??? Cataract     Nuclear and Mild Cortical Cataracts, OU   ??? Depression    ??? Diabetes mellitus (CMS-HCC) Dx 1996    Type II   ??? Diabetic retinopathy (CMS-HCC)     PDR OU and mild DME avastin injections x6 right eye  and left eye x1,PRP 2015   ??? Dry eyes    ??? Fractures    ??? Glaucoma     History of Neovascular Glaucoma OD, indeterminate stage   ??? Hypertension    ??? PONV (postoperative nausea and vomiting)    ??? Shoulder injury    ??? Tuberculosis 1978    completed one year of treatment.      PSHx:   Past Surgical History:   Procedure Laterality Date   ??? ANKLE FRACTURE SURGERY     ??? CATARACT EXTRACTION EXTRACAPSULAR W/ INTRAOCULAR LENS IMPLANTATION Left 10/10/2019   ??? HAND SURGERY     ??? PR AQUEOUS SHUNT EXTRAOC EQUAT PLATE RSVR W/GRAFT Right 10/12/2013    Procedure: AQUEOUS SHUNT-EXTRAOCULAR RESERVOIR;  Surgeon: Leda Roys, MD;  Location: MAIN OR [facility];  Service: Ophthalmology   ??? PR COLONOSCOPY W/BIOPSY SINGLE/MULTIPLE Left 11/16/2012    Procedure: COLONOSCOPY, FLEXIBLE, PROXIMAL TO SPLENIC FLEXURE; WITH BIOPSY, SINGLE OR MULTIPLE;  Surgeon: Lauretta Grill, MD;  Location: [facility] GI PROCEDURES [facility];  Service: Gastroenterology   ??? PR DRAIN ANT CHMBR,REMV BLOOD Right 10/12/2013    Procedure: PARACENTESIS (SEPART PROC); W/REMOV BLD;  Surgeon: Leda Roys, MD;  Location: MAIN OR [facility];  Service: Ophthalmology   ??? PR EYE SURG POST SGMT PROC UNLISTED Left 08/10/2016    PPV/EL/AIR left eye for VH   ??? PR OPEN RX STERNUM FRACTURE N/A 03/01/2018    Procedure: OPEN TX STERNUM FX W/WO SKELETAL FIXA;  Surgeon: Cherie Dark, MD;  Location: MAIN OR [facility];  Service: Thoracic   ??? PR REINFORCE SCLERA EYE W GRAFT Right 10/12/2013    Procedure: SCLERAL REINFORCEMENT (SEPART PROC); W/GFT;  Surgeon: Leda Roys, MD;  Location: MAIN OR [facility];  Service: Ophthalmology   ??? PR UPPER GI ENDOSCOPY,BIOPSY N/A 11/16/2012    Procedure: UGI ENDOSCOPY; WITH BIOPSY, SINGLE OR MULTIPLE;  Surgeon: Lauretta Grill, MD;  Location: [facility]URES [facility];  Service: Gastroenterology   ??? PR UPPER GI ENDOSCOPY,BIOPSY N/A 05/08/2013    Procedure: UGI ENDOSCOPY; WITH BIOPSY, SINGLE OR MULTIPLE;  Surgeon: Clint Bolder, MD;  [facility];  Service: Gastroenterology   ??? PR VITRECTOMY,PANRETINAL LASER RX Left 09/24/2014    Procedure: VITRECTOMY, MECHANICAL, PARS PLANA APPROACH; WITH ENDOLASER PANRETINAL PHOTOCOAGULATION;  Surgeon: Rockwell Alexandria, MD;  Location: [facility] OR [facility];  Service: Ophthalmology   ??? PR VITRECTOMY,PANRETINAL LASER RX Left 08/10/2016    Procedure: VITRECTOMY, MECHANICAL, PARS PLANA APPROACH; WITH ENDOLASER PANRETINAL PHOTOCOAGULATION;  Surgeon: Nicholaus Corolla, MD;  Location: [facility] OR [facility];  Service:  Ophthalmology   ??? PR VITRECTOMY,PANRETINAL LASER RX Right 06/26/2019    Procedure: VITRECTOMY, MECHANICAL, PARS PLANA APPROACH; WITH ENDOLASER PANRETINAL PHOTOCOAGULATION;  Surgeon: Nicholaus Corolla, MD;  Location: [facility] OR [facility];  Service: Ophthalmology   ??? PR XCAPSL CTRC RMVL INSJ IO LENS PROSTH W/O ECP Right 09/26/2019    Procedure: EXTRACAPSULAR CATARACT REMOVAL W/INSERTION OF INTRAOCULAR LENS PROSTHESIS, MANUAL OR MECHANICAL TECHNIQUE WITHOUT ENDOSCOPIC CYCLOPHOTOCOAGULATION;  Surgeon: Arville Care, MD;  Location: [facility] OR [facility];  Service: Ophthalmology   ??? PR XCAPSL CTRC RMVL INSJ IO LENS PROSTH W/O ECP Left 10/10/2019    Procedure: EXTRACAPSULAR CATARACT REMOVAL W/INSERTION OF INTRAOCULAR LENS PROSTHESIS, MANUAL OR MECHANICAL TECHNIQUE WITHOUT ENDOSCOPIC CYCLOPHOTOCOAGULATION;  Surgeon: Arville Care, MD;  Location: [facility] OR [facility];  Service: Ophthalmology   ??? SHOULDER SURGERY  2016    right   ??? TUBAL LIGATION     ??? YAG CAPSULOTOMY - OU - BOTH EYES Bilateral 11/08/2019     Family Hx:   Family History   Problem Relation Age of Onset   ??? No Known Problems Mother    ??? No Known Problems Father    ??? Diabetes Sister    ??? No Known Problems Daughter    ??? No Known Problems Daughter    ??? Diabetes Brother    ??? Diabetes Grandchild         Type 2   ??? No Known Problems Maternal Aunt    ??? No Known Problems Maternal Uncle    ??? No Known Problems Paternal Aunt    ??? No Known Problems Paternal Uncle    ??? No Known Problems Maternal Grandmother    ??? No Known Problems Maternal Grandfather    ??? No Known Problems Paternal Grandmother    ??? Psoriasis Paternal Grandfather    ??? No Known Problems Other    ??? Clotting disorder Neg Hx    ??? Anesthesia problems Neg Hx    ??? Thyroid disease Neg Hx    ??? Amblyopia Neg Hx    ??? Blindness Neg Hx    ??? Cancer Neg Hx    ??? Cataracts Neg Hx    ??? Glaucoma Neg Hx    ??? Hypertension Neg Hx    ??? Macular degeneration Neg Hx    ??? Retinal detachment Neg Hx    ??? Strabismus Neg Hx    ??? Stroke Neg Hx    ??? Melanoma Neg Hx    ??? Basal cell carcinoma Neg Hx    ??? Squamous cell carcinoma Neg Hx    ??? Substance Abuse Disorder Neg Hx    ??? Mental illness Neg Hx      Social Hx:   Social History     Socioeconomic History   ??? Marital status: Single   Tobacco Use   ??? Smoking status: Never   ??? Smokeless tobacco: Never   Vaping Use   ??? Vaping Use: Never used   Substance and Sexual Activity   ??? Alcohol use: No   ??? Drug use: No   ??? Sexual activity: Not Currently     Birth control/protection: Post-menopausal   Other Topics Concern   ??? Do you use sunscreen? No   ??? Tanning bed use? No   ??? Are you easily burned? No   ??? Excessive sun exposure? No   ??? Blistering sunburns? 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 From [place] originally.        **  Merged History Encounter **     Social Determinants of Health     Financial Resource Strain: Low Risk    ??? Difficulty of Paying Living Expenses: Not hard at all   Food Insecurity: No Food Insecurity   ??? Worried About Programme researcher, broadcasting/film/video in the Last Year: Never true   ??? Ran Out of Food in the Last Year: Never true   Transportation Needs: No Transportation Needs   ??? Lack of Transportation (Medical): No   ??? Lack of Transportation (Non-Medical): No       Medications:   Current Outpatient Medications   Medication Sig Dispense Refill   ??? amLODIPine (NORVASC) 5 MG tablet Take 1 tablet (5 mg total) by mouth daily. 90 tablet 1   ??? aspirin (ECOTRIN) 81 MG tablet Take 1 tablet (81 mg total) by mouth daily. 150 tablet 2   ??? atorvastatin (LIPITOR) 40 MG tablet Take 1 tablet (40 mg total) by mouth daily. 90 tablet 3   ??? BD INSULIN SYRINGE ULT-FINE II 1 mL 31 gauge x 5/16 Syrg 1 Package by Miscellaneous route Two (2) times a day. 100 each 3   ??? blood sugar diagnostic (ONETOUCH ULTRA BLUE TEST STRIP) Strp USE TO CHECK BLOOD SUGAR UP TO 10 TIMES DAILY AS NEEDED 600 strip 1   ??? blood-glucose meter kit Use as instructed 1 each 0   ??? clindamycin (CLEOCIN T) 1 % lotion Apply topically every morning. 60 mL 5   ??? dorzolamide-timoloL (COSOPT) 22.3-6.8 mg/mL ophthalmic solution Administer 1 drop to both eyes Two (2) times a day. 10 mL 12   ??? dulaglutide (TRULICITY) 0.75 mg/0.5 mL injection pen Inject the contents of 1 pen (0.75 mg total) under the skin every seven (7) days. 2 mL 1   ??? DULoxetine (CYMBALTA) 30 MG capsule Take 1 capsule (30 mg total) by mouth daily. 30 capsule 1   ??? empagliflozin (JARDIANCE) 25 mg tablet Take 1 tablet (25 mg total) by mouth daily. 90 tablet 3   ??? empty container Misc Use as directed to dispose of Humira pens. 1 each 2   ??? flash glucose sensor (FREESTYLE LIBRE 2 SENSOR) kit Use to monitor blood glucose levels continuously. Change sensor every 10 days 3 each 11   ??? folic acid/multivit-min/lutein (CENTRUM SILVER ORAL) Take 1 tablet by mouth daily at 0600.      ??? HUMIRA PEN CITRATE FREE 40 MG/0.4 ML Inject 0.4 mL (40 mg total) under the skin every seven (7) days. 4 each 11   ??? HUMIRA PEN CITRATE FREE STARTER PACK FOR CROHN'S/UC/HS 3 X 80 MG/0.8 ML Inject the contents of 2 pens (160 mg) under the skin on day 1, THEN 1 pen (80 mg) on day 15. 3 each 0   ??? ibuprofen (MOTRIN) 600 MG tablet Take 1 tablet (600 mg total) by mouth every six (6) hours as needed for pain. 30 tablet 2   ??? insulin glargine (BASAGLAR, LANTUS) 100 unit/mL (3 mL) injection pen Inject 0.5 mL (50 Units total) under the skin nightly. 15 mL 12   ??? lancing device Misc USE 4 TIMES DAILY BEFORE MEALS AND NIGHTLY ( USAR CUATRO VECES AL DIA SPBM Y TODAS LAS NOCHES ) 1 each 1   ??? losartan (COZAAR) 25 MG tablet Take 1 tablet (25 mg total) by mouth daily. 90 tablet 3   ??? metFORMIN (GLUCOPHAGE) 1000 MG tablet Take 1 tablet (1,000 mg total) by mouth 2 (two) times a day with meals. 180  tablet 3   ??? omeprazole (PRILOSEC) 20 MG capsule Take 1 capsule (20 mg total) by mouth daily. 90 capsule 3   ??? pen needle, diabetic (ULTICARE PEN NEEDLE) 32 gauge x 5/32 (4 mm) Ndle Use as directed two (2) times a day 100 each 12   ??? traZODone (DESYREL) 50 MG tablet Take 1 tablet (50 mg total) by mouth nightly. 90 tablet 3   ??? alcohol swabs PadM Test daily before all meals/snacks and once before bedtime. (Patient not taking: Reported on 10/01/2021) 400 each 1   ??? cetirizine (ZYRTEC) 10 MG tablet Take 1 tablet (10 mg total) by mouth daily. (Patient not taking: Reported on 10/01/2021) 30 tablet 2   ??? clindamycin (CLEOCIN T) 1 % Swab Apply 1 application topically Two (2) times a day. (Patient not taking: Reported on 10/01/2021) 60 each 11   ??? dexamethasone (DECADRON) 0.1 % ophthalmic solution Administer 4 drops into the left ear Two (2) times a day for 30 days (Patient not taking: Reported on 10/01/2021) 5 mL 0   ??? dextran 70-hypromellose, PF, (TEARS NATURAL FREE) 0.1-0.3 % Dpet  (Patient not taking: Reported on 10/01/2021)     ??? dextromethorphan (DELSYM) 30 mg/5 mL liquid Take 10 mL (60 mg total) by mouth Two (2) times a day. As needed for cough. (Patient not taking: Reported on 10/01/2021) 150 mL 0   ??? fluticasone propionate (FLONASE) 50 mcg/actuation nasal spray 1 spray into each nostril two (2) times a day. (Patient not taking: Reported on 10/01/2021) 48 g 1   ??? predniSONE (DELTASONE) 10 MG tablet Take 50 mg daily taper by 10 mg daily then stop (Patient not taking: Reported on 10/01/2021)     ??? predniSONE (DELTASONE) 20 MG tablet Take 1 tablet (20 mg total) by mouth daily. (Patient not taking: Reported on 10/01/2021) 30 tablet 0     No current facility-administered medications for this visit.     Facility-Administered Medications Ordered in Other Visits   Medication Dose Route Frequency Provider Last Rate Last Admin   ??? matrix hemostatic sealant (FLOSEAL) topical            ??? matrix hemostatic sealant (FLOSEAL) topical                Vitals:    10/01/21 1429   BP: 105/71   Pulse: 99   Temp: 36.2 ??C (97.2 ??F)   TempSrc: Temporal   Weight: 85.7 kg (189 lb)       Physical Exam  - GENERAL: WD/WN, in no acute distress  - HEAD: normocephalic, atraumatic  - EYES: no scleral icterus, EOMI  - ENT: MMM, no nasal or oral lesions, oropharynx without erythema   - NECK: supple, no palpable lymphadenopathy  - CARDIO: RRR, no murmurs  - PULM: CTAB, symmetric air movement  - SKIN: no rashes, normal scalp, no nail changes  - NEURO: alert and oriented x 3    - MSK:  Unable to abduct both shoulders > 90 degrees  Diffusely tender to palpation of the anterior chest and posterior neck / back musculature, as well as muscles of proximal and distal BUE and BLE; tearful with palpation of muscle groups  Difficulty climbing up on th exam table  Otherwise full ROM of bilateral elbows, wrists, UE digits, knees, ankles, LE digits  No TTP, swelling, or erythema of bilateral shoulders, elbows, wrists, UE digits, knees, ankles, LE digits      Labs:    Lab Results   Component Value  Date    WBC 6.8 01/29/2021    RBC 4.51 01/29/2021    HGB 12.1 01/29/2021    HCT 37.2 01/29/2021    MCV 82.6 01/29/2021    MCH 26.9 01/29/2021    MCHC 32.6 01/29/2021    RDW 14.9 01/29/2021    PLT 187 01/29/2021    MPV 11.9 (H) 01/29/2021        Chemistry        Component Value Date/Time    NA 136 01/29/2021 1408    NA 142 02/28/2011 1510    K 4.2 01/29/2021 1408    K 4.5 02/28/2011 1510    CL 101 01/29/2021 1408    CL 106 02/28/2011 1510    CO2 26.0 01/29/2021 1408    CO2 26 02/28/2011 1510    BUN 15 01/29/2021 1408    BUN 11 02/28/2011 1510    CREATININE 0.70 01/29/2021 1408    CREATININE 0.8 10/30/2019 1322    GLU 344 (H) 01/29/2021 1408        Component Value Date/Time    CALCIUM 9.4 01/29/2021 1408    CALCIUM 9.1 02/28/2011 1510    ALKPHOS 104 01/29/2021 1408    ALKPHOS 101 02/28/2011 1510    AST 22 01/29/2021 1408    AST 23 02/28/2011 1510    ALT 16 01/29/2021 1408    ALT 28 02/28/2011 1510    BILITOT 0.3 01/29/2021 1408    BILITOT 0.6 02/28/2011 1510          Lab Results   Component Value Date    ANA Negative 12/30/2019       Lab Results   Component Value Date    ESR 56 (H) 12/30/2019       Lab Results   Component Value Date    CREATUR 39.7 11/03/2020       No results found for: EJO1, ERNP, ESCL, ESMA, ESSA, ESSB    Lab Results   Component Value Date    HBSAG Nonreactive 06/09/2021    HEPBSAB Nonreactive 06/09/2021    QFTTBGOLD Negative 06/09/2021    HEPCAB Nonreactive 11/27/2018       Lab Results   Component Value Date    NITRITE Negative 11/27/2018    PROTEINUA Negative 06/20/2017    RBCUA 1 06/20/2017    WBC 6.8 01/29/2021    BLOODU Negative 06/20/2017    KETONESU Negative 06/20/2017       Lab Results   Component Value Date    ANCAIFA Negative 12/30/2019    MPO Negative 12/30/2019    MPOQT 0.7 12/30/2019    PR3ELISA Negative 12/30/2019       OCT, Retina - OU - Both Eyes  Laterality: both eyes. Patient cooperation was good.     Notes  Performed by H. Pohlman, COA    Spectralis optical coherence tomography macula of both eyes  Right eye: Intact foveal contour. No subretinal or intraretinal fluid.   Photoreceptors intact.  Left eye: Intact foveal contour. No subretinal. Tiny parafoveal cysts.   Photoreceptors intact.

## 2021-10-01 NOTE — Unmapped (Signed)
I saw and evaluated the patient, participating in the key portions of the service.  I reviewed the resident’s note.  I agree with the resident’s findings and plan.     Krisandra Bueno, MD

## 2021-10-01 NOTE — Unmapped (Signed)
Patient daughter dropped forms [facility].  States you are aware she is dropping them.  To bulky to scan with sticky notes.  Put in your box.  Thanks

## 2021-10-01 NOTE — Unmapped (Signed)
I think your pain is most likely due to fibromyalgia which is a condition where you have amplified pain.  I am providing you with information from the [employer] of Rheumatology as well as my own recommendations on treatment which focuses on three pillars of treatment including physical activity, good restorative health, and mental health.      The three pillars of treating fibromyalgia are the following:    1. Regular physical activity where you get your heart beating a little faster and you breath a little harder than normal.   I recommend you start an aerobic program such as walking. Start with 10 minutes 5 days/week.    After 2 weeks, gradually add 3-5 minutes per session every 1-2 weeks.  Keep a diary of your progress.  Reward yourself for success!   Walk With Ease is an excellent walking program.   Resources:   Exerciseismedicine.org  Everybodywalk.org    2. Good restorative sleep.   Practice good sleep practices including going to bed at the same time every night, only using your bed for sleep, limit caffeine 4-6 hours before bedtime, limit TV and computer use for 1-2 hours before bed, etc.  Some people benefit from a sleep study to look for sleep apnea. You can discuss this more with your primary care physician.  Resource: http://blog.BeepThis.co.uk    3. Individuals with fibromyalgia very often have symptoms of depression, anxiety, and symptoms of post-traumatic stress from prior traumatic event or abuse. It is important to address these symptoms to help improve your pain. You can talk more to your primary care physician or to a therapist.     FIBROMYALGIA (Information from the [employer] of Rheumatology)    Fast Facts  Fibromyalgia affects 2 - 4 percent of people, women more often than men.  Fibromyalgia is not an autoimmune or inflammation based illness, but research suggests the nervous system is involved.  Doctors diagnose fibromyalgia based on all the patient???s relevant symptoms (what you feel), no longer just on the number of tender places during an examination.  There is no test to detect this disease, but you may need lab tests or X-rays to rule out other health problems.  Though there is no cure, medications can reduce symptoms in some patients.  Patients also may feel better with proper self-care, such as exercise and getting enough sleep.    Fibromyalgia is a common neurologic health problem that causes widespread pain and tenderness (sensitivity to touch). The pain and tenderness tend to come and go, and move about the body. Most often, people with this chronic (long-term) illness are fatigued (very tired) and have sleep problems. The diagnosis can be made with a careful examination.    Fibromyalgia is most common in women, though it can occur in men. It most often starts in middle adulthood, but can occur in the teen years and in old age. You are at higher risk for fibromyalgia if you have a rheumatic disease (health problem that affects the joints, muscles and bones). These include osteoarthritis, lupus, rheumatoid arthritis, or ankylosing spondylitis.    What is fibromyalgia?  Fibromyalgia is a neurologic chronic health condition that causes pain all over the body and other symptoms. Other symptoms of fibromyalgia that patients most often have are:    Tenderness to touch or pressure affecting muscles and sometimes joints or even the skin  Severe fatigue  Sleep problems (waking up unrefreshed)  Problems with memory or thinking clearly    Some patients  also may have:    Depression or anxiety  Migraine or tension headaches  Digestive problems: irritable bowel syndrome (commonly called IBS) or gastroesophageal reflux disease (often referred to as GERD)  Irritable or overactive bladder  Pelvic pain  Temporomandibular disorder - often called TMJ (a set of symptoms including face or jaw pain, jaw clicking, and ringing in the ears)    What causes fibromyalgia?  The causes of fibromyalgia are unclear. They may be different in different people. Current research suggests involvement of the nervous system, particularly the central nervous system (brain and spinal cord). Fibromyalgia is not from an autoimmune, inflammation, joint, or muscle disorder. Fibromyalgia may run in families. There likely are certain genes that can make people more prone to getting fibromyalgia and the other health problems that can occur with it. Genes alone, though, do not cause fibromyalgia.    There is most often some triggering factor that sets off fibromyalgia. It may be spine problems, arthritis, injury, or other type of physical stress. Emotional stress also may trigger this illness. The result is a change in the way the body ???talks??? with the spinal cord and brain. Levels of brain chemicals and proteins may change. More recently, Fibromyalgia has been described as Central Pain Amplification disorder, meaning the volume of pain sensation in the brain is turned up too high.    Although Fibromyalgia can affect quality of life, it is still considered medically benign. It does not cause any heart attacks, stroke, cancer, physical deformities, or loss of life.    How is fibromyalgia diagnosed?  A doctor will suspect fibromyalgia based on your symptoms. Doctors may require that you have tenderness to pressure or tender points at a specific number of certain spots before saying you have fibromyalgia, but they are not required to make the diagnosis (see the Box). A physical exam can be helpful to detect tenderness and to exclude other causes of muscle pain. There are no diagnostic tests (such as X-rays or blood tests) for this problem. Yet, you may need tests to rule out another health problem that can be confused with fibromyalgia.    Because widespread body pain is the main feature of fibromyalgia, health care providers will ask you to describe your pain. This may help tell the difference between fibromyalgia and other diseases with similar symptoms. Other conditions such as hypothyroidism (underactive thyroid gland) and polymyalgia rheumatica sometimes mimic fibromyalgia. Blood tests can tell if you have either of these problems. Sometimes, fibromyalgia is confused with rheumatoid arthritis or lupus. But, again, there is a difference in the symptoms, physical findings and blood tests that will help your health care provider detect these health problems. Unlike fibromyalgia, these rheumatic diseases cause inflammation in the joints and tissues.    Criteria Needed for a Fibromyalgia Diagnosis  Pain and symptoms over the past week, based on the total of number of painful areas out of 19 parts of the body plus level of severity of these symptoms:  a. Fatigue  b. Waking unrefreshed  c. Cognitive (memory or thought) problems  Symptoms lasting at least three months at a similar level  No other health problem that would explain the pain and other symptoms    How is fibromyalgia treated?  There is no cure for fibromyalgia. However, symptoms can be treated with both non-drug and medication based treatments. Many times the best outcomes are achieved by using multiple types of treatments.    Non-Drug Therapies: People with fibromyalgia should use non-drug treatments  as well as any medicines their doctors suggest. Research shows that the most effective treatment for fibromyalgia is physical exercise. Physical exercise should be used in addition to any drug treatment. Patients benefit most from regular aerobic exercises. Other body-based therapies, including Tai Chi and yoga, can ease fibromyalgia symptoms. Although you may be in pain, low impact physical exercise will not be harmful.    Cognitive behavioral therapy is a type of therapy focused on understanding how thoughts and behaviors affect pain and other symptoms. CBT and related treatments, such as mindfulness, can help patients learn symptom reduction skills that lessen pain. Mindfulness is a Engineer, drilling that cultivates present moment awareness. Mindfulness based stress reduction has been shown to significantly improve symptoms of fibromyalgia.    Other complementary and alternative therapies (sometimes called CAM or integrative medicine), such as acupuncture, chiropractic and massage therapy, can be useful to manage fibromyalgia symptoms. Many of these treatments, though, have not been well tested in patients with fibromyalgia.    It is important to address risk factors and triggers for fibromyalgia including sleep disorders, such as sleep apnea, and mood problems such as stress, anxiety, panic disorder, and depression. This may require involvement of other specialists such as a Sleep Medicine doctor, Psychiatrist, and therapist.    Medications: The U.S. Food and Drug Administration has approved three drugs for the treatment of fibromyalgia. They include two drugs that change some of the brain chemicals (serotonin and norepinephrine) that help control pain levels: duloxetine (Cymbalta) and milnacipran (Savella). Older drugs that affect these same brain chemicals also may be used to treat fibromyalgia. These include amitriptyline (Elavil) and cyclobenzaprine (Flexeril). Other antidepressant drugs can be helpful in some patients. Side effects vary by the drug. Ask your doctor about the risks and benefits of your medicine.    The other drug approved for fibromyalgia is pregabalin (Lyrica). Pregabalin and another drug, gabapentin (Neurontin), work by blocking the over activity of nerve cells involved in pain transmission. These medicines may cause dizziness, sleepiness, swelling and weight gain.    It is strongly recommended to avoid opioid narcotic medications for treating fibromyalgia. The reason for this is that research evidence shows these drugs are not of helpful to most people with fibromyalgia, and will cause greater pain sensitivity or make pain persist. Tramadol (Ultram) may be used to treat fibromyalgia pain if short-term use of an opioid narcotic is needed. Over-the-counter medicines such as acetaminophen (Tylenol) or nonsteroidal anti-inflammatory drugs (commonly called NSAIDs) like ibuprofen (Advil, Motrin) or naproxen (Aleve, Anaprox) are not effective for fibromyalgia pain. Yet, these drugs may be useful to treat the pain triggers of fibromyalgia. Thus, they are most useful in people who have other causes for pain such as arthritis in addition to fibromyalgia.    For sleep problems, some of the medicines that treat pain also improve sleep. These include cyclobenzaprine (Flexeril), amitriptyline (Elavil), gabapentin (Neurontin) or pregabalin (Lyrica). It is not recommended that patients with fibromyalgia take sleeping medicines like zolpidem (Ambien) or benzodiazepine medications.    Living with fibromyalgia  Even with the many treatment options, patient self-care is vital to improving symptoms and daily function. In concert with medical treatment, healthy lifestyle behaviors can reduce pain, increase sleep quality, lessen fatigue and help you cope better with fibromyalgia. With proper treatment and self-care, you can get better and live a more normal life. Here are some self-care tips for living with fibromyalgia:    Make time to relax each day. Deep-breathing exercises and meditation  will help reduce the stress that can bring on symptoms.  Set a regular sleep pattern. Go to bed and wake up at the same time each day. Getting enough sleep lets your body repair itself, physically and mentally. Also, avoid daytime napping and limit caffeine intake, which can disrupt sleep. Nicotine is a stimulant, so those fibromyalgia patients with sleep problems should stop smoking.  Exercise often. This is a very important part of fibromyalgia treatment. While difficult at first, regular exercise often reduces pain symptoms and fatigue. Patients should follow the saying, ???Start low, go slow.??? Slowly add daily fitness into your routine. For instance, take the stairs instead of the elevator, or park further away from the store. As your symptoms decrease with drug treatments, start increasing your activity. Add in some walking, swimming, water aerobics and/or stretching exercises, and begin to do things that you stopped doing because of your pain and other symptoms. It takes time to create a comfortable routine. Just get moving, stay active and don't give up!  Educate yourself. Nationally recognized organizations like the Arthritis Foundation and the [employer] Fibromyalgia Association are great resources for information. Share this information with family, friends and co-workers.  Look forward, not backward. Focus on what you need to do to get better, not what caused your illness.    The role of the rheumatologist  Fibromyalgia is not a form of arthritis (joint disease). It does not cause inflammation or damage to joints, muscles or other tissues. However, because fibromyalgia can cause chronic pain and fatigue similar to arthritis, some people may advise you to see a rheumatologist. As a result, often a rheumatologist detects this disease (and rules out rheumatic diseases). For long term care, you do not need to follow with a rheumatologist. Your primary care physician can provide all the other care and treatment of fibromyalgia that you need.    Additional Information  The [employer] of Rheumatology has compiled this list to give you a starting point for your own additional research. The ACR does not endorse or maintain these websites and is not responsible for any information or claims provided on them. It is always best to talk with your rheumatologist for more information and before making any decisions about your care.    [employer] of Arthritis and Musculoskeletal and Skin Diseases  National Fibromyalgia Association  National Fibromyalgia and Chronic Pain Association  National Fibromyalgia Partnership, Inc.  The American Fibromyalgia Syndrome Association, Inc.    Updated March 2017 by Anson Oregon, MD and reviewed by the [facility] of Rheumatology Committee on Communications and Marketing.    This information is provided for general education only. Individuals should consult a qualified health care provider for professional medical advice, diagnosis and treatment of a medical or health condition.    ?? 2017 [employer] of Rheumatology.

## 2021-10-05 NOTE — Unmapped (Signed)
Left voicemail to inform her forms are ready for pick up.

## 2021-10-06 DIAGNOSIS — M2550 Pain in unspecified joint: Principal | ICD-10-CM

## 2021-10-06 DIAGNOSIS — Z794 Long term (current) use of insulin: Principal | ICD-10-CM

## 2021-10-06 DIAGNOSIS — M792 Neuralgia and neuritis, unspecified: Principal | ICD-10-CM

## 2021-10-06 DIAGNOSIS — E11319 Type 2 diabetes mellitus with unspecified diabetic retinopathy without macular edema: Principal | ICD-10-CM

## 2021-10-06 MED ORDER — DULOXETINE 60 MG CAPSULE,DELAYED RELEASE
ORAL_CAPSULE | Freq: Every day | ORAL | 1 refills | 90 days | Status: CP
Start: 2021-10-06 — End: 2022-10-06
  Filled 2021-10-13: qty 90, 90d supply, fill #0

## 2021-10-06 MED ORDER — TRULICITY 0.75 MG/0.5 ML SUBCUTANEOUS PEN INJECTOR
SUBCUTANEOUS | 1 refills | 28 days
Start: 2021-10-06 — End: ?

## 2021-10-06 NOTE — Unmapped (Addendum)
Daughter left voicemail this afternoon states Rheumatology was going to consult with you regarding a dose increase of Cymbalta.     Per Rheumatology Note   - She can follow up with her PCP and pain management for her fibromyalgia; Consider increasing duloxetine dose to 60 mg daily (maximal dose for pain relief)  - Referral to pain psychology placed.    She would like a call back once you have determine if you will be increasing medication.           5:19 PM   Rx sent to pharmacy, problem list updated.    - [place]

## 2021-10-07 MED ORDER — TRULICITY 0.75 MG/0.5 ML SUBCUTANEOUS PEN INJECTOR
SUBCUTANEOUS | 3 refills | 28 days | Status: CP
Start: 2021-10-07 — End: ?
  Filled 2021-10-13: qty 2, 28d supply, fill #0

## 2021-10-07 NOTE — Unmapped (Signed)
Patient is requesting the following refill  Requested Prescriptions     Pending Prescriptions Disp Refills    dulaglutide (TRULICITY) 0.75 mg/0.5 mL injection pen 2 mL 1     Sig: Inject the contents of 1 pen (0.75 mg total) under the skin every seven (7) days.       Recent Visits  Date Type Provider Dept   07/13/21 Office Visit Deneise Lever, FNP [facility] [address] [facility]   05/31/21 Office Visit Deneise Lever, FNP [facility]   02/24/21 Office Visit Desmond Dike, NP [facility]   01/29/21 Office Visit Deneise Lever, FNP [facility]   11/03/20 Office Visit Deneise Lever, FNP [facility]   Showing recent visits within past 365 days with a meds authorizing provider and meeting all other requirements  Future Appointments  Date Type Provider Dept   10/13/21 Appointment Deneise Lever, FNP [facility] [address] [facility]   Showing future appointments within next 365 days with a meds authorizing provider and meeting all other requirements       Labs: A1c:   HGB A1C, POC (%)   Date Value   11/16/2016 11.3 (H)     Hemoglobin A1C (%)   Date Value   05/31/2021 8.2 (A)

## 2021-10-07 NOTE — Unmapped (Signed)
Spoke with [name] she would like prescription sent to [facility].   She also would like to have patients problem list updated and would like to have fibromyalgia added to patients list.

## 2021-10-11 ENCOUNTER — Ambulatory Visit
Admit: 2021-10-11 | Discharge: 2021-10-12 | Payer: PRIVATE HEALTH INSURANCE | Attending: Student in an Organized Health Care Education/Training Program | Primary: Student in an Organized Health Care Education/Training Program

## 2021-10-11 MED ORDER — DORZOLAMIDE 22.3 MG-TIMOLOL 6.8 MG/ML EYE DROPS
Freq: Two times a day (BID) | OPHTHALMIC | 12 refills | 100 days | Status: CP
Start: 2021-10-11 — End: ?

## 2021-10-11 NOTE — Unmapped (Signed)
[facility] Refill Coordination Note    Specialty Medication(s) to be Shipped:   Inflammatory Disorders: Humira    Other medication(s) to be shipped:  cetirizine, dorzolamide-timolol, duloxetine, flonase, lantus, omeprazole, trulicity  and pen needles     Anne Barber, DOB: Jan 26, 1961  Phone: [phone] (home)       All above HIPAA information was verified with patient's family member, daughter.     Was a Nurse, learning disability used for this call? No    Completed refill call assessment today to schedule patient's medication shipment from the [facility] [phone]).  All relevant notes have been reviewed.     Specialty medication(s) and dose(s) confirmed: Regimen is correct and unchanged.   Changes to medications: Roby reports no changes at this time.  Changes to insurance: No  New side effects reported not previously addressed with a pharmacist or physician: None reported  Questions for the pharmacist: No    Confirmed patient received a Conservation officer, historic buildings and a Surveyor, mining with first shipment. The patient will receive a drug information handout for each medication shipped and additional FDA Medication Guides as required.       DISEASE/MEDICATION-SPECIFIC INFORMATION        For patients on injectable medications: Patient currently has 0 doses left.  Next injection is scheduled for 10/18/21.    SPECIALTY MEDICATION ADHERENCE     Medication Adherence    Patient reported X missed doses in the last month: 0  Specialty Medication: HUMIRA  Patient is on additional specialty medications: No              Were doses missed due to medication being on hold? No    Humira 40/0.4 mg/ml: 0 days of medicine on hand        REFERRAL TO PHARMACIST     Referral to the pharmacist: Not needed      [facility]     Shipping address confirmed in Epic.     Delivery Scheduled: Yes, Expected medication delivery date: 10/14/21.     Medication will be delivered via UPS to the prescription address in Epic WAM.    Unk Lightning   [facility] Specialty Technician
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
## 2021-10-13 ENCOUNTER — Ambulatory Visit: Admit: 2021-10-13 | Discharge: 2021-10-14 | Payer: PRIVATE HEALTH INSURANCE

## 2021-10-13 DIAGNOSIS — Z794 Long term (current) use of insulin: Principal | ICD-10-CM

## 2021-10-13 DIAGNOSIS — E11319 Type 2 diabetes mellitus with unspecified diabetic retinopathy without macular edema: Principal | ICD-10-CM

## 2021-10-13 LAB — ALBUMIN / CREATININE URINE RATIO
ALBUMIN QUANT URINE: <0.3 mg/dL
CREATININE, URINE: 58.7 mg/dL

## 2021-10-13 MED FILL — HUMIRA PEN CITRATE FREE 40 MG/0.4 ML: SUBCUTANEOUS | 28 days supply | Qty: 4 | Fill #4

## 2021-10-13 MED FILL — ULTICARE PEN NEEDLE 32 GAUGE X 5/32" (4 MM): SUBCUTANEOUS | 50 days supply | Qty: 100 | Fill #1

## 2021-10-13 MED FILL — FLUTICASONE PROPIONATE 50 MCG/ACTUATION NASAL SPRAY,SUSPENSION: NASAL | 30 days supply | Qty: 16 | Fill #4

## 2021-10-13 MED FILL — OMEPRAZOLE 20 MG CAPSULE,DELAYED RELEASE: ORAL | 90 days supply | Qty: 90 | Fill #2

## 2021-10-13 MED FILL — INSULIN GLARGINE (U-100) 100 UNIT/ML (3 ML) SUBCUTANEOUS PEN: SUBCUTANEOUS | 30 days supply | Qty: 15 | Fill #2

## 2021-10-13 NOTE — Unmapped (Signed)
I agree with the resident and have made myself available to the resident to discuss the patient's findings, assessment, and plan.    Arsenio Loader, MD  Glaucoma Fellow, Instructor in Ophthalmology  10/13/21

## 2021-10-13 NOTE — Unmapped (Signed)
Assessment and Plan:     Anne Barber was seen today for follow-up.    Diagnoses and all orders for this visit:    Type 2 diabetes mellitus with retinopathy, with long-term current use of insulin, macular edema presence unspecified, unspecified laterality, unspecified retinopathy severity (CMS-HCC)        Barriers to recommended plan: {barrierstocare:74100}    No follow-ups on file.      Subjective:     HPI: Anne Barber is a 61 y.o. female here for Follow-up (Voices she feels pain all over her body. ).    Fibromyalgia:  When wakes up has pain B hands, B elbows, back of B knees.  Some days, hard time ambulating performing self care due to pain  Has not started 60 mg Cymbalta  Some days no pain, but as day progresses has pain  Has appointment with Pain management 09/23  Has had OT, PT at home-limited due to pain    DM:  Adherent with 25 mg jardiance, 50 U lantus, 1000mg  metformin BID,       I have reviewed past medical, surgical, medications, allergies, social and family histories today and updated them in Epic where appropriate.    ROS:   Review of Systems     Review of systems negative unless otherwise noted as per HPI.      Objective:     Vitals:    10/13/21 1612   BP: 123/72   Pulse: 79   Temp: 36.1 ??C (97 ??F)   SpO2: 97%     Body mass index is 34.59 kg/m??.    Physical Exam       Medication adherence and barriers to the treatment plan have been addressed. Opportunities to optimize healthy behaviors have been discussed. Patient / caregiver voiced understanding.   I personally spent *** minutes face-to-face and non-face-to-face in the care of this patient, which includes all pre, intra, and post visit time on the date of service.  Cleon Dew, DNP, FNP-C  [facility] at [facility]  [phone] (F)    Note - This record has been created using [employer]. Chart creation errors have been sought, but may not always have been located. Such creation errors do not reflect on the standard of medical care. lb)   04/30/21 92.4 kg (203 lb 11.2 oz)   02/24/21 90.7 kg (200 lb)      Review of systems negative unless otherwise noted as per HPI.      Objective:     Vitals:    10/13/21 1612   BP: 123/72   Pulse: 79   Temp: 36.1 ??C (97 ??F)   SpO2: 97%     Body mass index is 34.59 kg/m??.    Physical Exam  Vitals and nursing note reviewed.   Constitutional:       Appearance: Normal appearance.   HENT:      Head: Normocephalic and atraumatic.   Cardiovascular:      Rate and Rhythm: Normal rate and regular rhythm.      Pulses: Normal pulses.      Heart sounds: Normal heart sounds.   Pulmonary:      Effort: Pulmonary effort is normal.      Breath sounds: Normal breath sounds.   Musculoskeletal:      Cervical back: Normal range of motion and neck supple.      Comments: Gait steady, slow   Skin:     General: Skin is warm.  Capillary Refill: Capillary refill takes less than 2 seconds.   Neurological:      General: No focal deficit present.      Mental Status: She is alert and oriented to person, place, and time. Mental status is at baseline.   Psychiatric:         Mood and Affect: Mood normal.         Behavior: Behavior normal.         Thought Content: Thought content normal.         Judgment: Judgment normal.          Medication adherence and barriers to the treatment plan have been addressed. Opportunities to optimize healthy behaviors have been discussed. Patient / caregiver voiced understanding.   I personally spent 35 minutes face-to-face and non-face-to-face in the care of this patient, which includes all pre, intra, and post visit time on the date of service.  Cleon Dew, DNP, FNP-C  [facility] at [facility]  [phone] (F)    Note - This record has been created using [employer]. Chart creation errors have been sought, but may not always have been located. Such creation errors do not reflect on the standard of medical care.

## 2021-11-09 NOTE — Unmapped (Signed)
[facility] Refill Coordination Note    Specialty Medication(s) to be Shipped:   Inflammatory Disorders: Humira    Other medication(s) to be shipped: cetirizine, basaglar, metformin, trazodone, trulicity     Anne Barber, DOB: 13-Feb-1961  Phone: [phone] (home)       All above HIPAA information was verified with patient's family member, daughter.     Was a Nurse, learning disability used for this call? No    Completed refill call assessment today to schedule patient's medication shipment from the [facility] [phone]).  All relevant notes have been reviewed.     Specialty medication(s) and dose(s) confirmed: Regimen is correct and unchanged.   Changes to medications: [name] reports no changes at this time.  Changes to insurance: No  New side effects reported not previously addressed with a pharmacist or physician: None reported  Questions for the pharmacist: No    Confirmed patient received a Conservation officer, historic buildings and a Surveyor, mining with first shipment. The patient will receive a drug information handout for each medication shipped and additional FDA Medication Guides as required.       DISEASE/MEDICATION-SPECIFIC INFORMATION        For patients on injectable medications: Patient currently has 1 doses left.  Next injection is scheduled for 8/21.    SPECIALTY MEDICATION ADHERENCE     Medication Adherence    Patient reported X missed doses in the last month: 0  Specialty Medication: HUMIRA(CF) PEN 40 mg/0.4 mL  Patient is on additional specialty medications: No  Patient is on more than two specialty medications: No  Any gaps in refill history greater than 2 weeks in the last 3 months: no  Demonstrates understanding of importance of adherence: yes  Informant: patient                          Were doses missed due to medication being on hold? No    Humira 40mg /0.42ml: Patient has 0 days of medication on hand    REFERRAL TO PHARMACIST     Referral to the pharmacist: Not needed      [facility]     Shipping address confirmed in Epic.     Delivery Scheduled: Yes, Expected medication delivery date: 8/18.     Medication will be delivered via Same Day Courier to the prescription address in Epic WAM.    Olga Millers   [facility] Specialty Technician

## 2021-11-12 MED FILL — INSULIN GLARGINE (U-100) 100 UNIT/ML (3 ML) SUBCUTANEOUS PEN: SUBCUTANEOUS | 30 days supply | Qty: 15 | Fill #3

## 2021-11-12 MED FILL — TRULICITY 0.75 MG/0.5 ML SUBCUTANEOUS PEN INJECTOR: SUBCUTANEOUS | 28 days supply | Qty: 2 | Fill #1

## 2021-11-12 MED FILL — HUMIRA PEN CITRATE FREE 40 MG/0.4 ML: SUBCUTANEOUS | 28 days supply | Qty: 4 | Fill #5

## 2021-11-12 MED FILL — METFORMIN 1,000 MG TABLET: ORAL | 90 days supply | Qty: 180 | Fill #3

## 2021-11-12 MED FILL — CETIRIZINE 10 MG TABLET: ORAL | 30 days supply | Qty: 30 | Fill #1

## 2021-11-12 MED FILL — TRAZODONE 50 MG TABLET: ORAL | 90 days supply | Qty: 90 | Fill #3

## 2021-12-06 DIAGNOSIS — Z794 Long term (current) use of insulin: Principal | ICD-10-CM

## 2021-12-06 DIAGNOSIS — I6523 Occlusion and stenosis of bilateral carotid arteries: Principal | ICD-10-CM

## 2021-12-06 DIAGNOSIS — I10 Essential (primary) hypertension: Principal | ICD-10-CM

## 2021-12-06 DIAGNOSIS — E11319 Type 2 diabetes mellitus with unspecified diabetic retinopathy without macular edema: Principal | ICD-10-CM

## 2021-12-06 MED ORDER — AMLODIPINE 5 MG TABLET
ORAL_TABLET | Freq: Every day | ORAL | 3 refills | 90 days | Status: CP
Start: 2021-12-06 — End: 2022-12-06
  Filled 2021-12-10: qty 90, 90d supply, fill #0

## 2021-12-06 MED ORDER — ATORVASTATIN 40 MG TABLET
ORAL_TABLET | Freq: Every day | ORAL | 3 refills | 90 days | Status: CP
Start: 2021-12-06 — End: 2022-12-06
  Filled 2021-12-10: qty 90, 90d supply, fill #0

## 2021-12-06 NOTE — Unmapped (Signed)
I reviewed this patient case and all documentation provided by the learner and was readily available for consultation during their interaction with the patient.  I agree with the assessment and plan listed below.    Anne Barber [facility] Specialty Pharmacist      [facility] Clinical Assessment & Refill Coordination Note    Anne Barber, [place]: [birth year]  Phone: [phone] (home)     All above HIPAA information was verified with patient's family member,  [name].     Was a Nurse, learning disability used for this call? No    Specialty Medication(s):   Inflammatory Disorders: Humira     Current Outpatient Medications   Medication Sig Dispense Refill    alcohol swabs PadM Test daily before all meals/snacks and once before bedtime. (Patient not taking: Reported on 10/01/2021) 400 each 1    amLODIPine (NORVASC) 5 MG tablet Take 1 tablet (5 mg total) by mouth daily. 90 tablet 1    aspirin (ECOTRIN) 81 MG tablet Take 1 tablet (81 mg total) by mouth daily. 150 tablet 2    atorvastatin (LIPITOR) 40 MG tablet Take 1 tablet (40 mg total) by mouth daily. 90 tablet 3    BD INSULIN SYRINGE ULT-FINE II 1 mL 31 gauge x 5/16 Syrg 1 Package by Miscellaneous route Two (2) times a day. 100 each 3    blood sugar diagnostic (ONETOUCH ULTRA BLUE TEST STRIP) Strp USE TO CHECK BLOOD SUGAR UP TO 10 TIMES DAILY AS NEEDED 600 strip 1    blood-glucose meter kit Use as instructed 1 each 0    cetirizine (ZYRTEC) 10 MG tablet Take 1 tablet (10 mg total) by mouth daily. (Patient not taking: Reported on 10/01/2021) 30 tablet 2    clindamycin (CLEOCIN T) 1 % lotion Apply topically every morning. 60 mL 5    clindamycin (CLEOCIN T) 1 % Swab Apply 1 application topically Two (2) times a day. (Patient not taking: Reported on 10/01/2021) 60 each 11    dexamethasone (DECADRON) 0.1 % ophthalmic solution Administer 4 drops into the left ear Two (2) times a day for 30 days (Patient not taking: Reported on 10/01/2021) 5 mL 0    dextran 70-hypromellose, PF, (TEARS NATURAL FREE) 0.1-0.3 % Dpet  (Patient not taking: Reported on 10/01/2021)      dextromethorphan (DELSYM) 30 mg/5 mL liquid Take 10 mL (60 mg total) by mouth Two (2) times a day. As needed for cough. (Patient not taking: Reported on 10/01/2021) 150 mL 0    dorzolamide-timoloL (COSOPT) 22.3-6.8 mg/mL ophthalmic solution Administer 1 drop to both eyes Two (2) times a day. (Patient not taking: Reported on 10/13/2021) 10 mL 12    dulaglutide (TRULICITY) 0.75 mg/0.5 mL injection pen Inject the contents of 1 pen (0.75 mg total) under the skin every seven (7) days. 2 mL 3    DULoxetine (CYMBALTA) 60 MG capsule Take 1 capsule (60 mg total) by mouth daily. 90 capsule 1    empagliflozin (JARDIANCE) 25 mg tablet Take 1 tablet (25 mg total) by mouth daily. 90 tablet 3    empty container Misc Use as directed to dispose of Humira pens. 1 each 2    flash glucose sensor (FREESTYLE LIBRE 2 SENSOR) kit Use to monitor blood glucose levels continuously. Change sensor every 10 days 3 each 11    fluticasone propionate (FLONASE) 50 mcg/actuation nasal spray 1 spray into each nostril two (2) times a day. 48 g  1    folic acid/multivit-min/lutein (CENTRUM SILVER ORAL) Take 1 tablet by mouth daily at 0600.       HUMIRA PEN CITRATE FREE 40 MG/0.4 ML Inject 0.4 mL (40 mg total) under the skin every seven (7) days. 4 each 11    HUMIRA PEN CITRATE FREE STARTER PACK FOR CROHN'S/UC/HS 3 X 80 MG/0.8 ML Inject the contents of 2 pens (160 mg) under the skin on day 1, THEN 1 pen (80 mg) on day 15. 3 each 0    ibuprofen (MOTRIN) 600 MG tablet Take 1 tablet (600 mg total) by mouth every six (6) hours as needed for pain. 30 tablet 2    insulin glargine (BASAGLAR, LANTUS) 100 unit/mL (3 mL) injection pen Inject 0.5 mL (50 Units total) under the skin nightly. 15 mL 12    lancing device Misc USE 4 TIMES DAILY BEFORE MEALS AND NIGHTLY ( USAR CUATRO VECES AL DIA SPBM Y TODAS LAS NOCHES ) 1 each 1    losartan (COZAAR) 25 MG tablet Take 1 tablet (25 mg total) by mouth daily. 90 tablet 3    metFORMIN (GLUCOPHAGE) 1000 MG tablet Take 1 tablet (1,000 mg total) by mouth 2 (two) times a day with meals. 180 tablet 3    omeprazole (PRILOSEC) 20 MG capsule Take 1 capsule (20 mg total) by mouth daily. 90 capsule 3    pen needle, diabetic (ULTICARE PEN NEEDLE) 32 gauge x 5/32 (4 mm) Ndle Use as directed two (2) times a day 100 each 12    predniSONE (DELTASONE) 10 MG tablet Take 50 mg daily taper by 10 mg daily then stop (Patient not taking: Reported on 10/01/2021)      predniSONE (DELTASONE) 20 MG tablet Take 1 tablet (20 mg total) by mouth daily. (Patient not taking: Reported on 10/01/2021) 30 tablet 0    traZODone (DESYREL) 50 MG tablet Take 1 tablet (50 mg total) by mouth nightly. 90 tablet 3     No current facility-administered medications for this visit.     Facility-Administered Medications Ordered in Other Visits   Medication Dose Route Frequency Provider Last Rate Last Admin    matrix hemostatic sealant (FLOSEAL) topical             matrix hemostatic sealant (FLOSEAL) topical                 Changes to medications: [name] reports no changes at this time.    No Known Allergies    Changes to allergies: No    SPECIALTY MEDICATION ADHERENCE       Humira 40 mg: 0 days of medicine on hand       Medication Adherence    Patient reported X missed doses in the last month: 0  Specialty Medication: Humira                           Specialty medication(s) dose(s) confirmed: Regimen is correct and unchanged.     Are there any concerns with adherence? No    Adherence counseling provided? Not needed    CLINICAL MANAGEMENT AND INTERVENTION      Clinical Benefit Assessment:    Do you feel the medicine is effective or helping your condition? Yes    Clinical Benefit counseling provided? Not needed    Adverse Effects Assessment:    Are you experiencing any side effects? No    Are you experiencing difficulty administering your medicine? No  Quality of Life Assessment:    Quality of Life    Rheumatology  Oncology  Dermatology  1. What impact has your specialty medication had on the symptoms of your skin condition (i.e. itchiness, soreness, stinging)?: Tremendous  2. What impact has your specialty medication had on your comfort level with your skin?: Tremendous  Cystic Fibrosis           Acute Infection Status:    Acute infections noted within Epic:  No active infections  Patient reported infection: None    Therapy Appropriateness:    Is therapy appropriate and patient progressing towards therapeutic goals? Yes, therapy is appropriate and should be continued    DISEASE/MEDICATION-SPECIFIC INFORMATION      For patients on injectable medications: Patient currently has 0 doses left.  Next injection is scheduled for 9/18.    PATIENT SPECIFIC NEEDS     Does the patient have any physical, cognitive, or cultural barriers? No    Is the patient high risk? No    Does the patient require a Care Management Plan? No     SOCIAL DETERMINANTS OF HEALTH     At the [facility], we have learned that life circumstances - like trouble affording food, housing, utilities, or transportation can affect the health of many of our patients.   That is why we wanted to ask: are you currently experiencing any life circumstances that are negatively impacting your health and/or quality of life? Patient declined to answer    Social Determinants of Health     Financial Resource Strain: Low Risk  (05/31/2021)    Overall Financial Resource Strain (CARDIA)     Difficulty of Paying Living Expenses: Not hard at all   Internet Connectivity: Not on file   Food Insecurity: No Food Insecurity (05/31/2021)    Hunger Vital Sign     Worried About Running Out of Food in the Last Year: Never true     Ran Out of Food in the Last Year: Never true   Tobacco Use: Low Risk  (10/13/2021)    Patient History     Smoking Tobacco Use: Never     Smokeless Tobacco Use: Never     Passive Exposure: Not on file   Housing/Utilities: Low Risk  (05/31/2021)    Housing/Utilities     Within the past 12 months, have you ever stayed: outside, in a car, in a tent, in an overnight shelter, or temporarily in someone else's home (i.e. couch-surfing)?: No     Are you worried about losing your housing?: No     Within the past 12 months, have you been unable to get utilities (heat, electricity) when it was really needed?: No   Alcohol Use: Not At Risk (11/05/2019)    Alcohol Use     How often do you have a drink containing alcohol?: Never     How many drinks containing alcohol do you have on a typical day when you are drinking?: 1 - 2     How often do you have 5 or more drinks on one occasion?: Never   Transportation Needs: No Transportation Needs (05/31/2021)    PRAPARE - Transportation     Lack of Transportation (Medical): No     Lack of Transportation (Non-Medical): No   Substance Use: Low Risk  (12/11/2019)    Substance Use     Taken prescription drugs for non-medical reasons: Never     Taken illegal drugs: Never     Patient indicated  they have taken drugs in the past year for non-medical reasons: Yes, [positive answer(s)]: Not on file   Health Literacy: Low Risk  (11/20/2020)    Health Literacy     : Never   Physical Activity: Not on file   Interpersonal Safety: Not on file   Stress: Not on file   Intimate Partner Violence: Not on file   Depression: Not at risk (08/29/2017)    PHQ-2     PHQ-2 Score: 0   Social Connections: Not on file       Would you be willing to receive help with any of the needs that you have identified today? Not applicable       SHIPPING     Specialty Medication(s) to be Shipped:   Inflammatory Disorders: Humira    Other medication(s) to be shipped:  Lantus, Trulicity, cetrizine, losartan, amlodipine, atorvastatin, Jardiance, pen needles     Changes to insurance: No    Delivery Scheduled: Yes, Expected medication delivery date: 9/15.     Medication will be delivered via Same Day Courier to the confirmed prescription address in [facility].    The patient will receive a drug information handout for each medication shipped and additional FDA Medication Guides as required.  Verified that patient has previously received a Conservation officer, historic buildings and a Surveyor, mining.    The patient or caregiver noted above participated in the development of this care plan and knows that they can request review of or adjustments to the care plan at any time.      All of the patient's questions and concerns have been addressed.    Gaye Alken   [facility] Specialty Pharmacist

## 2021-12-10 MED FILL — INSULIN GLARGINE (U-100) 100 UNIT/ML (3 ML) SUBCUTANEOUS PEN: SUBCUTANEOUS | 30 days supply | Qty: 15 | Fill #4

## 2021-12-10 MED FILL — TRULICITY 0.75 MG/0.5 ML SUBCUTANEOUS PEN INJECTOR: SUBCUTANEOUS | 28 days supply | Qty: 2 | Fill #2

## 2021-12-10 MED FILL — JARDIANCE 25 MG TABLET: ORAL | 90 days supply | Qty: 90 | Fill #2

## 2021-12-10 MED FILL — LOSARTAN 25 MG TABLET: ORAL | 90 days supply | Qty: 90 | Fill #2

## 2021-12-10 MED FILL — HUMIRA PEN CITRATE FREE 40 MG/0.4 ML: SUBCUTANEOUS | 28 days supply | Qty: 4 | Fill #6

## 2021-12-10 MED FILL — CETIRIZINE 10 MG TABLET: ORAL | 30 days supply | Qty: 30 | Fill #2

## 2021-12-10 MED FILL — ULTICARE PEN NEEDLE 32 GAUGE X 5/32" (4 MM): SUBCUTANEOUS | 50 days supply | Qty: 100 | Fill #2

## 2021-12-17 ENCOUNTER — Ambulatory Visit: Admit: 2021-12-17 | Discharge: 2021-12-18 | Payer: PRIVATE HEALTH INSURANCE

## 2021-12-17 DIAGNOSIS — M2550 Pain in unspecified joint: Principal | ICD-10-CM

## 2021-12-17 DIAGNOSIS — Z0289 Encounter for other administrative examinations: Principal | ICD-10-CM

## 2021-12-17 DIAGNOSIS — M797 Fibromyalgia: Principal | ICD-10-CM

## 2021-12-17 MED ORDER — PREGABALIN 50 MG CAPSULE
ORAL_CAPSULE | ORAL | 0 refills | 60 days | Status: CP
Start: 2021-12-17 — End: 2022-02-15
  Filled 2021-12-29: qty 113, 60d supply, fill #0

## 2021-12-17 MED ORDER — PREGABALIN 25 MG CAPSULE
Freq: Two times a day (BID) | ORAL | 0 refills | 0 days | Status: CN
Start: 2021-12-17 — End: ?

## 2021-12-17 NOTE — Unmapped (Signed)
Spanish Interpreter: Alecia Lemming, [id number]

## 2021-12-17 NOTE — Unmapped (Addendum)
It was great to see you today. We discussed medical management with gabapentin, lyrica or low dose naltrexone. At this time, we will start you on lyrica.    -As we talked about, we will start a new medication called Lyrica (also known as pregabalin).     Lyrica   Week Morning Afternoon Bedtime   1 0 0 1 pill   2 1 pill 0 1 pill      1 pill     If you have sleepiness or dizziness, you can increase the number of days you are at each dose before moving up to the next higher dose.  These symptoms are generally short-lived if you have them and they tend to improve in a few days.  Be cautious with driving/ operating heavy equipment while you are starting until you are aware of how your body reacts to the medication. Occasionally there is swelling in the feet or hands.    You need to take the Lyrica every day as scheduled in order to have it work on your pain.  Do not stop this medication abruptly, it needs to be slowly decreased.    We also discussed setting up a meeting with pain psychology as well as referral to water therapy.     We also extensively discussed patient's onset of pain after a brief discontinuation of humira. We will reach out to rheumatologist to evaluate for other causes of pain.

## 2021-12-17 NOTE — Unmapped (Signed)
AVS and Rxes reviewed.  Below controlled  Rxes sent electronically to pt's pharmacy.  Pt voiced understanding.     Disp Refills Start End    pregabalin (LYRICA) 50 MG capsule 113 capsule 0 12/17/2021 02/15/2022    Sig - Route: Take 1 capsule (50 mg total) by mouth daily for 7 days, THEN 1 capsule (50 mg total) Two (2) times a day. - Oral    Sent to pharmacy as: pregabalin 50 mg capsule (LYRICA)

## 2021-12-17 NOTE — Unmapped (Signed)
Department of Anesthesiology  [facility]  [address], [address]  [place], [place] [address]  [phone]      Date: December 17, 2021  Patient Name: Anne Barber  MRN: [id number]  PCP: Deneise Lever  Referring Provider: Marylene Land,*    Assessment:   Attending: Suelynn Quirke is a 61 y.o. female with a PMHx significant for DM. [facility].    Per chart review, the patient was referred to our clinic by Dermatology. Possible Fibromyalgia patient. Patient endorsed muscle pain and weakness since missing a Humira dose in February. The patient was seen by an outside rheumatologist at [facility] who thought she had PMR vs Fibromyalgia. The patient was extremely uncomfortable on exam and in immense pain. She was noted to have inflammatory lesions on her hands that were supposedly transient. As such, Derm placed an urgent referral to [facility] Rheumatology. Would love to get their expertise and input regarding this patient, as there was much more going on then her skin disease.  Patient does have a history of glaucoma and is unable to take steroids per daughter per ophthalmologist.    1. Arthralgia, unspecified joint/ 2. Fibromyalgia  Patient presents with diffuse body pain throughout and recent diagnosis of Fibromyalgia. Her pain changes in severity and location every couple of days which is consistent with nature of Fibromyalgia pain. She is currently taking Motrin prn and Cymbalta 60 mg daily with benefit. She has not tried Lyrica or gabapentin in the past. We discussed pharmaceutical management with gabapentin, Lyrica, or LDN in hopes of further analgesia. We checked recent labs and kidney functions were within normal limits. Upon exam, diffuse tenderness to palpation and hyperalgesia to light touch.  Patient holds her joints throughout the entire visit.  She also has significant swelling of her right index finger MCP joint.  Pain impacts her daily activities and limits functionality significantly. We discussed Pain Psych referral in hopes of improving coping skills as there is a component of anxiety and depression to her pain. We encouraged exercises and movement given diagnosis of Fibromyalgia and discusses placing a referral to aquatic PT for assistance with exercising and stretching. Patient was amenable to try Lyrica or gabapentin first, as well as referral to Pain Psych. We agreed to start Lyrica 50 mg BID and place referral to aquatic therapy in hopes of further analgesia. We could try LDN in the future, if patient does not tolerate Lyrica.     Of note patient's daughter reports a pinpoint onset of her pain related to her discontinuation and reinitiation of her Humira medication.  We discussed that as her mother has significant hyperalgesia to touch as well as significant pain upon very light palpation of her right index finger joint as well as her elbows, there may be a component of inflammation that does not necessarily fit the picture of fibromyalgia.  We reached out to her rheumatologist for further investigation to assess whether patient has another condition necessitating labs or work-up.  Patient may also need to switch this medication depending on what her dermatologist/rheumatologist decide due to her significant disability occurring weekly.     At this visit we will also obtain a urine drug screen and tox agreement as we may consider starting other analgesics in the future.    Patient does  appear to be utilizing pain medications appropriately and does  report that the medications do improve patient's  quality of life and functionality level.  At today's visit, the patient reports poor analgesia from their current medication regimen without significant adverse effects.        1. Fibromyalgia    2. Arthralgia, unspecified joint        General Recommendations: The pain condition that the patient suffers from is best treated with a multidisciplinary approach that involves an increase in physical activity to prevent de-conditioning and worsening of the pain cycle, as well as psychological counseling (formal and/or informal) to address the co-morbid psychological affects of pain.  Treatment will often involve judicious use of pain medications and interventional procedures to decrease the pain, allowing the patient to participate in the physical activity that will ultimately produce long-lasting pain reductions.  The goal of the multidisciplinary approach is to return the patient to a higher level of overall function and to restore their ability to perform activities of daily living.          Plan:     - Referral to Pain Psych  - Referral to aquatic therapy  - Start Lyrica 50 mg BID; titration instructions provided   - Continue Cymbalta 60 mg daily   - Also contacted her rheumatologist regarding further investigation of her condition as she has significant arthralgias and possible synovitis  - Obtained UDS and tox agreement     Future Considerations:  LDN    -Return in about 8 weeks (around 02/11/2022).    Orders Placed This Encounter   Procedures    Drug Screen, [facility], Urine     Order Specific Question:   Patient's Current Medications     Answer:   Not provided     Order Specific Question:   Release to patient     Answer:   Immediate [1]    Ambulatory referral to Pain Psychology     Standing Status:   Future     Standing Expiration Date:   12/18/2022     Referral Priority:   Routine     Referral Type:   Generic Referral     Number of Visits Requested:   1    Ambulatory referral to Physical Therapy     Standing Status:   Future     Standing Expiration Date:   12/18/2022     Referral Priority:   Routine     Referral Type:   Physical Therapy     Number of Visits Requested:   1    Misc nursing order (specify)     Tox agreement     Requested Prescriptions     Signed Prescriptions Disp Refills    pregabalin (LYRICA) 50 MG capsule 113 capsule 0     Sig: Take 1 capsule (50 mg total) by mouth daily for 7 days, THEN 1 capsule (50 mg total) Two (2) times a day.         Subjective:     HPI:  Ms. Overfelt is seen in consultation at the request of Marylene Land,*  For evaluation and recommendations regarding Her chronic pain.     Today, she reports being diagnosed with fibromyalgia recently and complains of diffuse pain located at her bilateral knee and shoulder at this time. The patient describes a nauseous pain. The patient reports constant pain throughout the day and notes some relief with medications. She states her pain waxes and wanes and moves to different locations throughout her body. The patient reports difficulty sleeping due to pain and notes worsened pain  in the mornings. Her daughter states she has difficulty with ADLs limited by pain and notes a history of depression component. She is hopeful for her mother to become more active again. She also reports having to help her mother hold her cups to drink water or coffee due to severe pain at times. The patient reports she difficulty with activities due to pain and states is affects her functionality. She states difficulty with getting up out of bed and completing hygiene activities due to pain. Her daughter states she has had PT and OT in the past.     She also complains of right sided head and facial pain in which she had workup that is thus far insignificant for anything. The patient also reports intermittent chest pain. The patient inquires about blacking out and falling due to these pains.     In terms of medications, the patient reports taking Motrin with some benefit. She reports taking Cymbalta 60 mg daily with benefit. She denies trying Lyrica or gabapentin. Patient's daughter does report that in the past she has given her mother a happy cookie and that has helped her with pain control. Her daughter inquires about steroids in the medications we discussed as her mother has a history of glaucoma and was recommended to not take any steroids by her ophthalmologist and inquires about adverse reactions with patient's other daily medications. The patient inquires about if medications would affect her kidneys and denies a history of kidney issues. The patient states she would like to try Lyrica or gabapentin first and then LDN if those do not work. She reports trying several OTC topicals and medications without benefit.    Of note patient's daughter reports that she started taking Humira after being diagnosed with hidradenitis suppurativa.  Patient states that she was doing okay on this medication however due to insurance or a lapse in prescription for 2 weeks she had discontinued this medication and started to have her onset of pain.  Patient's daughter states that after she started to retake her Humira she would become severely disabled for the next day or 2 and be bedridden due to severe pain and then would finally be able to perform daily activities on the third day.  Patient's daughter states she takes this medication once a week on Mondays.  She states they discussed her condition with her rheumatologist who pointed her in the direction of fibromyalgia as she has no other significant findings.         Pain Clinic? No    Current Medications:  Cymbalta 60 mg daily     Her pain started approximately  6 months ago.  Her pain involving bilateral shoulder, hands, hips, feet, and knee.    Her pain is described as aching, burning, pressing, shock, sore, and tender  Phebe Sanchez is  presenting to the clinic today with pain that is graded as 7/10 in intensity. Her highest level of pain is reported as 10/10 in intensity, least pain level is 5/10 and average pain level is 8/10.  Her pain started Suddenly.  Her pain symptoms are associated with numbness, tingling, weakness, and swelling.  Her pain is made worse with exercise, sitting, lying, standing, walking, and climbing stairs.  Her pain is present all of the time   Her pain is improved with medication, massage, and nothing.     Previous interventions include Medications    Workmans Compensation is not involved.  This is not involved with a lawsuit or lawyer  The treatment goals include Complete resolution with medications if necessary    Previous Medication Trials: duloxetine/cymbalta, fluoxetine, and ibuprofen    Past Medical History:   Diagnosis Date    Anisocoria     Cataract     Nuclear and Mild Cortical Cataracts, OU    Depression     Diabetes mellitus (CMS-HCC) Dx 1996    Type II    Diabetic retinopathy (CMS-HCC)     PDR OU and mild DME avastin injections x6 right eye  and left eye x1,PRP 2015    Dry eyes     Fractures     Glaucoma     History of Neovascular Glaucoma OD, indeterminate stage    Hypertension     PONV (postoperative nausea and vomiting)     Shoulder injury     Tuberculosis 1978    completed one year of treatment.      Past Surgical History:   Procedure Laterality Date    ANKLE FRACTURE SURGERY      CATARACT EXTRACTION EXTRACAPSULAR W/ INTRAOCULAR LENS IMPLANTATION Left 10/10/2019    HAND SURGERY      PR AQUEOUS SHUNT EXTRAOC EQUAT PLATE RSVR W/GRAFT Right 10/12/2013    Procedure: AQUEOUS SHUNT-EXTRAOCULAR RESERVOIR;  Surgeon: Leda Roys, MD;  Location: MAIN OR [facility];  Service: Ophthalmology    PR COLONOSCOPY W/BIOPSY SINGLE/MULTIPLE Left 11/16/2012    Procedure: COLONOSCOPY, FLEXIBLE, PROXIMAL TO SPLENIC FLEXURE; WITH BIOPSY, SINGLE OR MULTIPLE;  Surgeon: Lauretta Grill, MD;  Location: [facility] PROCED[facility];  Service: Gastroenterology    PR DRAIN ANT [facility] BLOOD Right 10/12/2013    Procedure: PARACENTESIS (SEPART PROC); W/REMOV BLD;  Surgeon: Leda Roys, MD;  Location: MAIN OR [facility];  Service: Ophthalmology    PR EYE SURG POST SGMT PROC UNLISTED Left 08/10/2016    PPV/EL/AIR left eye for [facility]    PR OPEN RX STERNUM FRACTURE N/A 03/01/2018    Procedure: OPEN TX STERNUM FX W/WO SKELETAL FIXA; Surgeon: Cherie Dark, MD;  Location: MAIN OR [facility];  Service: Thoracic    PR REINFORCE SCLERA EYE W GRAFT Right 10/12/2013    Procedure: SCLERAL REINFORCEMENT (SEPART PROC); W/GFT;  Surgeon: Leda Roys, MD;  Location: MAIN OR [facility];  Service: Ophthalmology    PR UPPER GI ENDOSCOPY,BIOPSY N/A 11/16/2012    Procedure: UGI ENDOSCOPY; WITH BIOPSY, SINGLE OR MULTIPLE;  Surgeon: Lauretta Grill, MD;  Location: [facility];  Service: Gastroenterology    PR UPPER GI ENDOSCOPY,BIOPSY N/A 05/08/2013    Procedure: UGI ENDOSCOPY; WITH BIOPSY, SINGLE OR MULTIPLE;  Surgeon: Clint Bolder, MD;  [facility]: [facility];  Service: Gastroenterology    PR VITRECTOMY,PANRETINAL LASER RX Left 09/24/2014    Procedure: VITRECTOMY, MECHANICAL, PARS PLANA APPROACH; WITH ENDOLASER PANRETINAL PHOTOCOAGULATION;  Surgeon: Rockwell Alexandria, MD;  Location: [facility] OR [facility];  Service: Ophthalmology    PR VITRECTOMY,PANRETINAL LASER RX Left 08/10/2016    Procedure: VITRECTOMY, MECHANICAL, PARS PLANA APPROACH; WITH ENDOLASER PANRETINAL PHOTOCOAGULATION;  Surgeon: Nicholaus Corolla, MD;  Location: [facility] OR [facility];  Service: Ophthalmology    PR Hessie Knows LASER RX Right 06/26/2019    Procedure: VITRECTOMY, MECHANICAL, PARS PLANA APPROACH; WITH ENDOLASER PANRETINAL PHOTOCOAGULATION;  Surgeon: Nicholaus Corolla, MD;  Location: [facility] OR [facility];  Service: Ophthalmology    PR XCAPSL CTRC RMVL INSJ IO LENS PROSTH W/O ECP Right 09/26/2019    Procedure: EXTRACAPSULAR CATARACT REMOVAL W/INSERTION OF INTRAOCULAR LENS PROSTHESIS, MANUAL OR MECHANICAL TECHNIQUE WITHOUT ENDOSCOPIC  CYCLOPHOTOCOAGULATION;  Surgeon: Arville Care, MD;  Location: [facility] OR [facility];  Service: Ophthalmology    PR XCAPSL CTRC RMVL INSJ IO LENS PROSTH W/O ECP Left 10/10/2019    Procedure: EXTRACAPSULAR CATARACT REMOVAL W/INSERTION OF INTRAOCULAR LENS PROSTHESIS, MANUAL OR MECHANICAL TECHNIQUE WITHOUT ENDOSCOPIC CYCLOPHOTOCOAGULATION;  Surgeon: Arville Care, MD;  Location: [facility] OR [facility];  Service: Ophthalmology    SHOULDER SURGERY  2016    right    TUBAL LIGATION      YAG CAPSULOTOMY - OU - BOTH EYES Bilateral 11/08/2019     Family History   Problem Relation Age of Onset    No Known Problems Mother     No Known Problems Father     Diabetes Sister     No Known Problems Daughter     No Known Problems Daughter     Diabetes Brother     Diabetes Grandchild         Type 2    No Known Problems Maternal Aunt     No Known Problems Maternal Uncle     No Known Problems Paternal Aunt     No Known Problems Paternal Uncle     No Known Problems Maternal Grandmother     No Known Problems Maternal Grandfather     No Known Problems Paternal Grandmother     Psoriasis Paternal Grandfather     No Known Problems Other     Clotting disorder Neg Hx     Anesthesia problems Neg Hx     Thyroid disease Neg Hx     Amblyopia Neg Hx     Blindness Neg Hx     Cancer Neg Hx     Cataracts Neg Hx     Glaucoma Neg Hx     Hypertension Neg Hx     Macular degeneration Neg Hx     Retinal detachment Neg Hx     Strabismus Neg Hx     Stroke Neg Hx     Melanoma Neg Hx     Basal cell carcinoma Neg Hx     Squamous cell carcinoma Neg Hx     Substance Abuse Disorder Neg Hx     Mental illness Neg Hx        Social History:  She reports that she has never smoked. She has never used smokeless tobacco. She reports that she does not drink alcohol and does not use drugs.    The patient is widowed  The patient has children: Yes, 2  The patient is living with children  Highest level of education: High school  Current Employment: Not working/unemployed and Disabled  Exercise: walking  Recreational Drugs: No  Treatment for Substance abuse: No  Use anothers prescription medications: No    Allergies as of 12/17/2021    (No Known Allergies)      Current Outpatient Medications   Medication Sig Dispense Refill    alcohol swabs PadM Test daily before all meals/snacks and once before bedtime. 400 each 1 amLODIPine (NORVASC) 5 MG tablet Take 1 tablet (5 mg total) by mouth daily. 90 tablet 3    aspirin (ECOTRIN) 81 MG tablet Take 1 tablet (81 mg total) by mouth daily. 150 tablet 2    atorvastatin (LIPITOR) 40 MG tablet Take 1 tablet (40 mg total) by mouth daily. 90 tablet 3    BD INSULIN SYRINGE ULT-FINE II 1 mL 31 gauge x 5/16 Syrg 1 Package by Miscellaneous route Two (2) times a day. 100 each 3  blood sugar diagnostic (ONETOUCH ULTRA BLUE TEST STRIP) Strp USE TO CHECK BLOOD SUGAR UP TO 10 TIMES DAILY AS NEEDED 600 strip 1    cetirizine (ZYRTEC) 10 MG tablet Take 1 tablet (10 mg total) by mouth daily. 30 tablet 2    clindamycin (CLEOCIN T) 1 % lotion Apply topically every morning. 60 mL 5    clindamycin (CLEOCIN T) 1 % Swab Apply 1 application topically Two (2) times a day. 60 each 11    dexamethasone (DECADRON) 0.1 % ophthalmic solution Administer 4 drops into the left ear Two (2) times a day for 30 days 5 mL 0    dextran 70-hypromellose, PF, (TEARS NATURAL FREE) 0.1-0.3 % Dpet       dextromethorphan (DELSYM) 30 mg/5 mL liquid Take 10 mL (60 mg total) by mouth Two (2) times a day. As needed for cough. 150 mL 0    dorzolamide-timoloL (COSOPT) 22.3-6.8 mg/mL ophthalmic solution Administer 1 drop to both eyes Two (2) times a day. 10 mL 12    dulaglutide (TRULICITY) 0.75 mg/0.5 mL injection pen Inject the contents of 1 pen (0.75 mg total) under the skin every seven (7) days. 2 mL 3    DULoxetine (CYMBALTA) 60 MG capsule Take 1 capsule (60 mg total) by mouth daily. 90 capsule 1    empagliflozin (JARDIANCE) 25 mg tablet Take 1 tablet (25 mg total) by mouth daily. 90 tablet 3    empty container Misc Use as directed to dispose of Humira pens. 1 each 2    flash glucose sensor (FREESTYLE LIBRE 2 SENSOR) kit Use to monitor blood glucose levels continuously. Change sensor every 10 days 3 each 11    fluticasone propionate (FLONASE) 50 mcg/actuation nasal spray 1 spray into each nostril two (2) times a day. 48 g 1    folic acid/multivit-min/lutein (CENTRUM SILVER ORAL) Take 1 tablet by mouth daily at 0600.       HUMIRA PEN CITRATE FREE 40 MG/0.4 ML Inject 0.4 mL (40 mg total) under the skin every seven (7) days. 4 each 11    HUMIRA PEN CITRATE FREE STARTER PACK FOR CROHN'S/UC/HS 3 X 80 MG/0.8 ML Inject the contents of 2 pens (160 mg) under the skin on day 1, THEN 1 pen (80 mg) on day 15. 3 each 0    ibuprofen (MOTRIN) 600 MG tablet Take 1 tablet (600 mg total) by mouth every six (6) hours as needed for pain. 30 tablet 2    insulin glargine (BASAGLAR, LANTUS) 100 unit/mL (3 mL) injection pen Inject 0.5 mL (50 Units total) under the skin nightly. 15 mL 12    losartan (COZAAR) 25 MG tablet Take 1 tablet (25 mg total) by mouth daily. 90 tablet 3    metFORMIN (GLUCOPHAGE) 1000 MG tablet Take 1 tablet (1,000 mg total) by mouth 2 (two) times a day with meals. 180 tablet 3    omeprazole (PRILOSEC) 20 MG capsule Take 1 capsule (20 mg total) by mouth daily. 90 capsule 3    pen needle, diabetic (ULTICARE PEN NEEDLE) 32 gauge x 5/32 (4 mm) Ndle Use as directed two (2) times a day 100 each 12    predniSONE (DELTASONE) 10 MG tablet       traZODone (DESYREL) 50 MG tablet Take 1 tablet (50 mg total) by mouth nightly. 90 tablet 3    blood-glucose meter kit Use as instructed 1 each 0    lancing device Misc USE 4 TIMES DAILY BEFORE MEALS AND NIGHTLY (  USAR CUATRO VECES AL DIA SPBM Y TODAS LAS NOCHES ) 1 each 1    predniSONE (DELTASONE) 20 MG tablet Take 1 tablet (20 mg total) by mouth daily. (Patient not taking: Reported on 10/01/2021) 30 tablet 0    pregabalin (LYRICA) 50 MG capsule Take 1 capsule (50 mg total) by mouth daily for 7 days, THEN 1 capsule (50 mg total) Two (2) times a day. 113 capsule 0     No current facility-administered medications for this visit.     Facility-Administered Medications Ordered in Other Visits   Medication Dose Route Frequency Provider Last Rate Last Admin    matrix hemostatic sealant (FLOSEAL) topical             matrix hemostatic sealant (FLOSEAL) topical                Lab Results   Component Value Date    CREATININE 0.70 01/29/2021       Lab Results   Component Value Date    ALKPHOS 104 01/29/2021    BILITOT 0.3 01/29/2021    PROT 7.8 01/29/2021    ALBUMIN 3.9 01/29/2021    ALT 16 01/29/2021    AST 22 01/29/2021       Lab Results   Component Value Date    PLT 187 01/29/2021         Urine toxiciology screen  No results found for: AMPHU, BARBU, BENZU, CANNAU, METHU, OPIAU, COCAU    OPIOID CONFIRMATION:  No results found for: CDIFFTOX, NBUPR, LABCO, HYDROCODONE, HYDROMORPH, MORPHINE, OXYCODONE, OXMU, MAMU, OPIU     BENZODIAZEPINE CONFIRMATION:  No results found for: CDIFFTOX, OHALP, CLONU, HDXYFLRAZUR, 7NHFLU, DESALKYCONF, LORAZURQT, HDXYTRIAZUR, MIDAZURQT, BNZU    Review of Systems:  GENERAL: snoring, night sweats, difficulty sleeping, chills  HEENT: blurred vision, deafness/hearing problems, ringing in ears  SKIN:none  PULMONARY:cough  ENDOCRINE: increased sweating, hot flashes  GASTROINTESTINAL:nausea, vomiting, diarrhea, constipation, heartburn, and gastric reflux  GENITOURINARY:frequent urination, difficulty with urination control  MUSCULOSKELETAL:joint aches/, joint swelling, spasticity, back pain, muscle aches, and muscle weakness  CARDIOVASCULAR:chest pain  NEUROLOGIC:coordination difficulty, migraines/headaches, and numbness/tingling  PSYCHIATRIC: anxiety, depression, low concentration, low energy, agitation, anger, emotional outbursts, lack of interests in activities, feeling hopeless/helpless, and isoliting yourself  She denies any homicidal or suicidal ideation.   HEMATOLOGIC: none  IMMUNOLOGIC: none      Objective:     PHYSICAL EXAM:  BP 125/79  - Pulse 94  - Temp 36.2 ??C (97.2 ??F) (Skin)  - Resp 16  - Ht 154.9 cm (5' 1)  - Wt 83.3 kg (183 lb 11.2 oz)  - SpO2 96%  - BMI 34.71 kg/m??   Wt Readings from Last 3 Encounters:   12/17/21 83.3 kg (183 lb 11.2 oz)   10/13/21 85.8 kg (189 lb 1.6 oz)   10/01/21 85.7 kg (189 lb)     GENERAL: Well developed, well-nourished obese female and is in no apparent distress. The patient is pleasant and interactive. Patient is a good historian.  No evidence of sedation or intoxication.  No overt pain behaviors.  HEENT: Normocephalic/atraumatic. Clear sclera.   CARDIOVASCULAR:  RRR, no murmur  RESPIRATORY:  Normal work of breathing, no supplemental 02  EXTREMITIES: No clubbing, cyanosis noted.  Joints appear significantly tender to touch. Appears to have possible synovitis/ swelling of right index finger MCP  GASTROINTESTINAL: Soft, nondistended  NEUROLOGIC:  Alert and oriented, speech fluent, normal language. Cranial nerves grossly intact. Sensation decreased left lateral of LLE.   MUSCULOSKELETAL:  Motor function 5/5 in upper and lower extremities. Patient rises from a seated position with minimal difficulty.  The patient was able to ambulate with minimal difficulty throughout the clinic today without the assistance of a walking aid-None.  Diffuse tenderness throughout with palpation. Hyperalgesia to light tough.   SKIN: No obvious rashes lesions or erythema on the exposed skin  PSYCHIATRIC:Appropriate, full range affect, no psychomotor retardation    Documentation assistance was provided by Waldemar Dickens Scribe, on December 17, 2021 at 9:53 AM for Dr. Jerrell Mylar, DO.    Plan was formulated and discussed with Dr. Fayrene Fearing.

## 2021-12-21 DIAGNOSIS — M797 Fibromyalgia: Principal | ICD-10-CM

## 2021-12-24 LAB — DRUG SCREEN, PAIN CLINIC, URINE
2-HYDROXY ETHYL FLURAZEPAM, UR: NOT DETECTED ng/mL
6-MONOACETYLMORPHINE, UR: NOT DETECTED ng/mL
7-AMINOCLONAZEPAM, UR: NOT DETECTED ng/mL
7-AMINOFLUNITRAZEPAM, UR: NOT DETECTED ng/mL
ALPHA-HYDROXY MIDAZOLAM, UR: NOT DETECTED ng/mL
ALPHA-HYDROXY TRIAZOLAM, UR: NOT DETECTED ng/mL
ALPHA-HYDROXYALPRAZOLAM GLUCURONIDE, UR: NOT DETECTED ng/mL
ALPRAZOLAM, UR: NOT DETECTED ng/mL
AMPHETAMINE, UR: NOT DETECTED ng/mL
BUPRENORPHINE, UR: NOT DETECTED ng/mL
CHLORDIAZEPOXIDE, UR: NOT DETECTED ng/mL
CLOBAZAM, UR: NOT DETECTED ng/mL
CLONAZEPAM, UR: NOT DETECTED ng/mL
COCAINE METABOLITES URINE: NEGATIVE ng/mL
CODEINE, UR: NOT DETECTED ng/mL
CODEINE-6-BETA-GLUCURONIDE, UR: NOT DETECTED ng/mL
COMMENT,URINE: NORMAL
CREATININE-O-ADULT: 52.6 mg/dL
DIAZEPAM, UR: NOT DETECTED ng/mL
DIHYDROCODEINE, UR: NOT DETECTED ng/mL
EDDP, UR: NOT DETECTED ng/mL
EPHEDRINE, UR: NOT DETECTED ng/mL
FENTANYL, UR: NOT DETECTED ng/mL
FLUNITRAZEPAM, UR: NOT DETECTED ng/mL
FLURAZEPAM, UR: NOT DETECTED ng/mL
HYDROCODONE, UR: NOT DETECTED ng/mL
HYDROMORPHONE, UR: NOT DETECTED ng/mL
HYDROMORPHONE-3-BETA-GLUCURONIDE, UR: NOT DETECTED ng/mL
LORAZEPAM GLUCURONIDE, UR: NOT DETECTED ng/mL
LORAZEPAM, UR: NOT DETECTED ng/mL
MDA, UR: NOT DETECTED ng/mL
MDEA, UR: NOT DETECTED ng/mL
MDMA, UR: NOT DETECTED ng/mL
MEPERIDINE, UR: NOT DETECTED ng/mL
METHADONE, UR: NOT DETECTED ng/mL
METHAMPHETAMINE, UR: NOT DETECTED ng/mL
METHYLPHENIDATE, UR: NOT DETECTED ng/mL
MIDAZOLAM, UR: NOT DETECTED ng/mL
MORPHINE, UR: NOT DETECTED ng/mL
MORPHINE-6-BETA-GLUCURONIDE, UR: NOT DETECTED ng/mL
N-DESMETHYLCLOBAZAM, UR: NOT DETECTED ng/mL
NALOXONE, UR: NOT DETECTED ng/mL
NORBUPRENOORPHINE GLUCURONIDE, UR: NOT DETECTED ng/mL
NORBUPRENORPHINE, UR: NOT DETECTED ng/mL
NORDIAZEPAM, UR: NOT DETECTED ng/mL
NORFENTANYL, UR: NOT DETECTED ng/mL
NORHYDROCODONE, UR: NOT DETECTED ng/mL
NORMEPERIDINE, UR: NOT DETECTED ng/mL
NOROXYCODONE, UR: NOT DETECTED ng/mL
NOROXYMORPHONE, UR: NOT DETECTED ng/mL
OXAZEPAM GLUCURONIDE, UR: NOT DETECTED ng/mL
OXAZEPAM, UR: NOT DETECTED ng/mL
OXIDANTS-O-ADULT: NEGATIVE
OXYCODONE, UR: NOT DETECTED ng/mL
OXYMORPHONE, UR: NOT DETECTED ng/mL
PH-O-ADULT: 5.5
PHENCYCLIDINE, UR: NOT DETECTED ng/mL
PHENTERMINE, UR: NOT DETECTED ng/mL
PRAZEPAM, UR: NOT DETECTED ng/mL
PROPOXYPHENE, UR: NOT DETECTED ng/mL
PSEUDOEPHEDRINE, UR: NOT DETECTED ng/mL
RITALINIC ACID, UR: NOT DETECTED ng/mL
SPECIFIC GRAVITY-O-ADULT: 1.009
TAPENTADOL, UR: NOT DETECTED ng/mL
TEMAZEPAM GLUCURONIDE, UR: NOT DETECTED ng/mL
TEMAZEPAM, UR: NOT DETECTED ng/mL
THC, SCREEN URINE: NEGATIVE ng/mL
TRAMADOL, UR: NOT DETECTED ng/mL
TRIAZOLAM, UR: NOT DETECTED ng/mL
URINE BARBITURATES MAYO: NEGATIVE ng/mL
ZOLPIDEM PHENYL-4-CARBOXYLIC ACID, UR: NOT DETECTED ng/mL
ZOLPIDEM, UR: NOT DETECTED ng/mL

## 2021-12-25 NOTE — Unmapped (Signed)
I saw and evaluated the patient, participating in the key portions of the service.  I reviewed the resident’s note.  I agree with the resident’s findings and plan.

## 2021-12-27 ENCOUNTER — Ambulatory Visit: Admit: 2021-12-27 | Discharge: 2021-12-27 | Payer: PRIVATE HEALTH INSURANCE

## 2021-12-27 DIAGNOSIS — M1380 Other specified arthritis, unspecified site: Principal | ICD-10-CM

## 2021-12-27 LAB — COMPREHENSIVE METABOLIC PANEL
ALBUMIN: 3.6 g/dL (ref 3.4–5.0)
ALKALINE PHOSPHATASE: 128 U/L — ABNORMAL HIGH (ref 46–116)
ALT (SGPT): 12 U/L (ref 10–49)
ANION GAP: 9 mmol/L (ref 5–14)
AST (SGOT): 22 U/L (ref <=34)
BILIRUBIN TOTAL: 0.2 mg/dL — ABNORMAL LOW (ref 0.3–1.2)
BLOOD UREA NITROGEN: 12 mg/dL (ref 9–23)
BUN / CREAT RATIO: 15
CALCIUM: 8.7 mg/dL (ref 8.7–10.4)
CHLORIDE: 104 mmol/L (ref 98–107)
CO2: 26 mmol/L (ref 20.0–31.0)
CREATININE: 0.8 mg/dL
EGFR CKD-EPI (2021) FEMALE: 83 mL/min/{1.73_m2} (ref >=60)
GLUCOSE RANDOM: 190 mg/dL — ABNORMAL HIGH (ref 70–179)
POTASSIUM: 4 mmol/L (ref 3.4–4.8)
PROTEIN TOTAL: 7.5 g/dL (ref 5.7–8.2)
SODIUM: 139 mmol/L (ref 135–145)

## 2021-12-27 LAB — CBC W/ AUTO DIFF
BASOPHILS ABSOLUTE COUNT: 0 10*9/L (ref 0.0–0.1)
BASOPHILS RELATIVE PERCENT: 0.5 %
EOSINOPHILS ABSOLUTE COUNT: 0.1 10*9/L (ref 0.0–0.5)
EOSINOPHILS RELATIVE PERCENT: 2 %
HEMATOCRIT: 34.3 % (ref 34.0–44.0)
HEMOGLOBIN: 11.2 g/dL — ABNORMAL LOW (ref 11.3–14.9)
LYMPHOCYTES ABSOLUTE COUNT: 1.3 10*9/L (ref 1.1–3.6)
LYMPHOCYTES RELATIVE PERCENT: 20.8 %
MEAN CORPUSCULAR HEMOGLOBIN CONC: 32.6 g/dL (ref 32.0–36.0)
MEAN CORPUSCULAR HEMOGLOBIN: 25.7 pg — ABNORMAL LOW (ref 25.9–32.4)
MEAN CORPUSCULAR VOLUME: 78.8 fL (ref 77.6–95.7)
MEAN PLATELET VOLUME: 9.5 fL (ref 6.8–10.7)
MONOCYTES ABSOLUTE COUNT: 0.3 10*9/L (ref 0.3–0.8)
MONOCYTES RELATIVE PERCENT: 5.3 %
NEUTROPHILS ABSOLUTE COUNT: 4.4 10*9/L (ref 1.8–7.8)
NEUTROPHILS RELATIVE PERCENT: 71.4 %
NUCLEATED RED BLOOD CELLS: 0 /100{WBCs} (ref <=4)
PLATELET COUNT: 272 10*9/L (ref 150–450)
RED BLOOD CELL COUNT: 4.35 10*12/L (ref 3.95–5.13)
RED CELL DISTRIBUTION WIDTH: 16.7 % — ABNORMAL HIGH (ref 12.2–15.2)
WBC ADJUSTED: 6.2 10*9/L (ref 3.6–11.2)

## 2021-12-27 MED ORDER — FOLIC ACID 1 MG TABLET
ORAL_TABLET | Freq: Every day | ORAL | 11 refills | 30 days | Status: CP
Start: 2021-12-27 — End: 2022-12-27
  Filled 2021-12-29: qty 30, 30d supply, fill #0

## 2021-12-27 MED ORDER — METHOTREXATE SODIUM 2.5 MG TABLET
ORAL_TABLET | ORAL | 0 refills | 44 days | Status: CP
Start: 2021-12-27 — End: 2022-02-09
  Filled 2021-12-29: qty 28, 28d supply, fill #0

## 2021-12-27 MED ADMIN — ketorolac (TORADOL) injection 30 mg: 30 mg | INTRAMUSCULAR | @ 20:00:00 | Stop: 2021-12-27

## 2021-12-27 NOTE — Unmapped (Unsigned)
Rheumatology Clinic Follow Up Visit Note     Assessment and Plan: Anne Barber is a 61 y.o.  female with a PMH of obesity, HTN, HLD, DM with retinopathy, hidradenitis suppurativa on adalimumab (since 04/2020), depression, and anxiety who arrives follow up evaluation of arthralgias and myalgias. In brief, she began developing diffuse myalgias and arthralgias in March, after missing 2 doses of adalimumab for her HS. She describes fatigue, generalized pain throughout muscles of her whole body, joint pain, discomfort with light touch (such as when sheets rub on her skin), and warmth sensation in her muscles. Today, she also reports pain and swelling in her left wrist and right shoulder. We performed musculoskeletal ultrasound on her left wrist today which was notable for synovitis with +CPD signal within the radiocarpal joint and tenosynovitis of the common extensor tendon. We attempted to ultrasound her right shoulder although views were limited by pain and patient's inability to position; however, no large effusions noted on posterior view. She certainly has fibromyalgia but there is also evidence of synovitis in her right wrist. 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 She is on adalimumab but noted to have +ANA 1:2560 (-ENA, -dsDNA, normal compliments). She may have antibody formation to adalimumab. We will start her on methotrexate for inflammatory arthritis, as well as send off adalimumab antibodies.    Seronegative inflammatory arthritis   Hidradenitis suppurativa  - Labs today including CBC, CMP, and adalimumab level/Ab  - Discussed short course of steroids for relief but patient is insistent several of her doctors told her to avoid prednisone due to her ocular issue (glaucoma?) and does not want prednisone; Given IM toradol instead for relief  - Start methotrexate 15 mg weekly x 2 weeks, then increase to 20 mg weekly if able to tolerate  - CBC with diff and CMP 4 weeks after methotrexate initiation  - Discussed the potential complications of this therapy, including: immunosuppression, bone marrow suppression, liver toxicity, renal impairment, GI side effects, stomatitis and alopecia. Discussed taking folic acid 1 mg daily to help with side effects  - Messaged dermatology regarding follow up for HS    Fibromyalgia:  - Continue follow up with pain management    Return in about 3 months (around 03/29/2022).     Patient understands and agrees to plan above.    Patient was discussed with Dr. Evonnie Pat DO MPH  Rheumatology Fellow, PGY5    Initial HPI: Anne Barber is a 61 y.o.  female with a PMH of obesity, HTN, HLD, DM with retinopathy, hidradenitis suppurativa on adalimumab, depression, and anxiety who arrives for evaluation of arthralgias and myalgias.    Since her last visit, she reports ongoing pain. She has been having severe pain at least 3x a week. She reports that the pain starts at the tip of her left hand, and extends to her wrists and shoulders. Her left wrist is currently swollen and painful. She reports pain in her shoulders and neck. She is unable to lie down due to pain. Even the pillow bothers her. She has been having pins and needle sensation in her feet. Last weekend, she has been unable to go up the steps due to pain. She is unable to lift her arms due to pain.     She wakes up around her 8 AM and makes her coffee. By 9 AM, after standing for an hour, her pain worsens. Her pain also worsens by nighttime  to where she is unable to lie down on the pillow. Light touch is painful.    On Sept 22nd, she had an episode of flushing and chest discomfort. She laid down and began to have extreme pain in her knees and legs.    ROS:  Otherwise no reported oral/nasal ulcers, cough, or rashes. +fevers/chills, +facial swelling, +chest pain     Labs:  - 05/2021: -ANA, -dsDNA  - 07/2021: +ANA >1:2560, -ENA, -dsDNA, normal compliments, -RF, -CCP, ESR 45, CRP 15    No family hx related to rheumatology noted    Allergies: Other  Immunizations:   Immunization History   Administered Date(s) Administered    COVID-19 VAC,BIVALENT(25YR UP),PFIZER 05/08/2021    COVID-19 VAC,BIVALENT(5-1YR),PFIZER 05/08/2021    COVID-19 VAC,MRNA,TRIS(12Y UP)(PFIZER)(GRAY CAP) 05/08/2020    COVID-19 VACC,MRNA,(PFIZER)(PF) 07/01/2019, 07/22/2019, 05/08/2020    INFLUENZA INJ MDCK PF, QUAD,(FLUCELVAX)(26MO AND UP EGG FREE) 01/20/2020, 01/29/2021    Influenza Vaccine Quad (IIV4 PF) 37mo+ injectable 05/23/2017, 11/29/2017    PNEUMOCOCCAL POLYSACCHARIDE 23 06/25/2014, 10/13/2021    SHINGRIX-ZOSTER VACCINE (HZV), RECOMBINANT,SUB-UNIT,ADJUVANTED IM 06/28/2020, 09/06/2020    TdaP 06/25/2014      PMHx:   Past Medical History:   Diagnosis Date    Anisocoria     Cataract     Nuclear and Mild Cortical Cataracts, OU    Depression     Diabetes mellitus (CMS-HCC) Dx 1996    Type II    Diabetic retinopathy (CMS-HCC)     PDR OU and mild DME avastin injections x6 right eye  and left eye x1,PRP 2015    Dry eyes     Fractures     Glaucoma     History of Neovascular Glaucoma OD, indeterminate stage    Hypertension     PONV (postoperative nausea and vomiting)     Shoulder injury     Tuberculosis 1978    completed one year of treatment.      PSHx:   Past Surgical History:   Procedure Laterality Date    ANKLE FRACTURE SURGERY      CATARACT EXTRACTION EXTRACAPSULAR W/ INTRAOCULAR LENS IMPLANTATION Left 10/10/2019    HAND SURGERY      PR AQUEOUS SHUNT EXTRAOC EQUAT PLATE RSVR W/GRAFT Right 10/12/2013    Procedure: AQUEOUS SHUNT-EXTRAOCULAR RESERVOIR;  Surgeon: Leda Roys, MD;  Location: MAIN OR [facility];  Service: Ophthalmology    PR COLONOSCOPY W/BIOPSY SINGLE/MULTIPLE Left 11/16/2012    Procedure: COLONOSCOPY, FLEXIBLE, PROXIMAL TO SPLENIC FLEXURE; WITH BIOPSY, SINGLE OR MULTIPLE;  Surgeon: Lauretta Grill, MD;  Location: [facility] GI PROCEDURES [facility];  Service: Gastroenterology    PR DRAIN ANT [facility] BLOOD Right 10/12/2013    Procedure: PARACENTESIS (SEPART PROC); W/REMOV BLD;  Surgeon: Leda Roys, MD;  Location: MAIN OR [facility];  Service: Ophthalmology    PR EYE SURG POST SGMT PROC UNLISTED Left 08/10/2016    PPV/EL/AIR left eye for [facility]    PR OPEN RX STERNUM FRACTURE N/A 03/01/2018    Procedure: OPEN TX STERNUM FX W/WO SKELETAL FIXA;  Surgeon: Cherie Dark, MD;  Location: MAIN OR [facility];  Service: Thoracic    PR REINFORCE SCLERA EYE W GRAFT Right 10/12/2013    Procedure: SCLERAL REINFORCEMENT (SEPART PROC); W/GFT;  Surgeon: Leda Roys, MD;  Location: MAIN OR [facility];  Service: Ophthalmology    PR UPPER GI ENDOSCOPY,BIOPSY N/A 11/16/2012    Procedure: UGI ENDOSCOPY; WITH BIOPSY, SINGLE OR MULTIPLE;  Surgeon: Lauretta Grill, MD;  Location: [facility] PROCEDURES [facility];  Service: Gastroenterology    PR UPPER GI ENDOSCOPY,BIOPSY N/A 05/08/2013    Procedure: UGI ENDOSCOPY; WITH BIOPSY, SINGLE OR MULTIPLE;  Surgeon: Clint Bolder, MD;  [facility];  Service: Gastroenterology    PR VITRECTOMY,PANRETINAL LASER RX Left 09/24/2014    Procedure: VITRECTOMY, MECHANICAL, PARS PLANA APPROACH; WITH ENDOLASER PANRETINAL PHOTOCOAGULATION;  Surgeon: Rockwell Alexandria, MD;  Location: [facility] OR [facility];  Service: Ophthalmology    PR VITRECTOMY,PANRETINAL LASER RX Left 08/10/2016    Procedure: VITRECTOMY, MECHANICAL, PARS PLANA APPROACH; WITH ENDOLASER PANRETINAL PHOTOCOAGULATION;  Surgeon: Nicholaus Corolla, MD;  Location: [facility] OR [facility];  Service: Ophthalmology    PR Hessie Knows LASER RX Right 06/26/2019    Procedure: VITRECTOMY, MECHANICAL, PARS PLANA APPROACH; WITH ENDOLASER PANRETINAL PHOTOCOAGULATION;  Surgeon: Nicholaus Corolla, MD;  Location: [facility] OR [facility];  Service: Ophthalmology    PR XCAPSL CTRC RMVL INSJ IO LENS PROSTH W/O ECP Right 09/26/2019    Procedure: EXTRACAPSULAR CATARACT REMOVAL W/INSERTION OF INTRAOCULAR LENS PROSTHESIS, MANUAL OR MECHANICAL TECHNIQUE WITHOUT ENDOSCOPIC CYCLOPHOTOCOAGULATION;  Surgeon: Arville Care, MD;  Location: [facility] OR [facility];  Service: Ophthalmology    PR XCAPSL CTRC RMVL INSJ IO LENS PROSTH W/O ECP Left 10/10/2019    Procedure: EXTRACAPSULAR CATARACT REMOVAL W/INSERTION OF INTRAOCULAR LENS PROSTHESIS, MANUAL OR MECHANICAL TECHNIQUE WITHOUT ENDOSCOPIC CYCLOPHOTOCOAGULATION;  Surgeon: Arville Care, MD;  Location: [facility] OR [facility];  Service: Ophthalmology    SHOULDER SURGERY  2016    right    TUBAL LIGATION      YAG CAPSULOTOMY - OU - BOTH EYES Bilateral 11/08/2019     Family Hx:   Family History   Problem Relation Age of Onset    No Known Problems Mother     No Known Problems Father     Diabetes Sister     No Known Problems Daughter     No Known Problems Daughter     Diabetes Brother     Diabetes Grandchild         Type 2    No Known Problems Maternal Aunt     No Known Problems Maternal Uncle     No Known Problems Paternal Aunt     No Known Problems Paternal Uncle     No Known Problems Maternal Grandmother     No Known Problems Maternal Grandfather     No Known Problems Paternal Grandmother     Psoriasis Paternal Grandfather     No Known Problems Other     Clotting disorder Neg Hx     Anesthesia problems Neg Hx     Thyroid disease Neg Hx     Amblyopia Neg Hx     Blindness Neg Hx     Cancer Neg Hx     Cataracts Neg Hx     Glaucoma Neg Hx     Hypertension Neg Hx     Macular degeneration Neg Hx     Retinal detachment Neg Hx     Strabismus Neg Hx     Stroke Neg Hx     Melanoma Neg Hx     Basal cell carcinoma Neg Hx     Squamous cell carcinoma Neg Hx     Substance Abuse Disorder Neg Hx     Mental illness Neg Hx      Social Hx:   Social History     Socioeconomic History    Marital status: Single   Tobacco Use    Smoking status: Never    Smokeless  tobacco: Never   Vaping Use    Vaping Use: Never used   Substance and Sexual Activity    Alcohol use: No    Drug use: No    Sexual activity: Not Currently     Birth control/protection: Post-menopausal   Other Topics Concern    Do you use sunscreen? No    Tanning bed use? No    Are you easily burned? No    Excessive sun exposure? No    Blistering sunburns? 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 From [place] originally.        ** Merged History Encounter **     Social Determinants of Health     Financial Resource Strain: Low Risk  (05/31/2021)    Overall Financial Resource Strain (CARDIA)     Difficulty of Paying Living Expenses: Not hard at all   Food Insecurity: No Food Insecurity (05/31/2021)    Hunger Vital Sign     Worried About Running Out of Food in the Last Year: Never true     Ran Out of Food in the Last Year: Never true   Transportation Needs: No Transportation Needs (05/31/2021)    PRAPARE - Therapist, art (Medical): No     Lack of Transportation (Non-Medical): No       Medications:   Current Outpatient Medications   Medication Sig Dispense Refill    alcohol swabs PadM Test daily before all meals/snacks and once before bedtime. 400 each 1    amLODIPine (NORVASC) 5 MG tablet Take 1 tablet (5 mg total) by mouth daily. 90 tablet 3    aspirin (ECOTRIN) 81 MG tablet Take 1 tablet (81 mg total) by mouth daily. 150 tablet 2    atorvastatin (LIPITOR) 40 MG tablet Take 1 tablet (40 mg total) by mouth daily. 90 tablet 3    BD INSULIN SYRINGE ULT-FINE II 1 mL 31 gauge x 5/16 Syrg 1 Package by Miscellaneous route Two (2) times a day. 100 each 3    blood sugar diagnostic (ONETOUCH ULTRA BLUE TEST STRIP) Strp USE TO CHECK BLOOD SUGAR UP TO 10 TIMES DAILY AS NEEDED 600 strip 1    cetirizine (ZYRTEC) 10 MG tablet Take 1 tablet (10 mg total) by mouth daily. 30 tablet 2    clindamycin (CLEOCIN T) 1 % lotion Apply topically every morning. 60 mL 5    clindamycin (CLEOCIN T) 1 % Swab Apply 1 application topically Two (2) times a day. 60 each 11    dexamethasone (DECADRON) 0.1 % ophthalmic solution Administer 4 drops into the left ear Two (2) times a day for 30 days 5 mL 0    dextran 70-hypromellose, PF, (TEARS NATURAL FREE) 0.1-0.3 % Dpet       dextromethorphan (DELSYM) 30 mg/5 mL liquid Take 10 mL (60 mg total) by mouth Two (2) times a day. As needed for cough. 150 mL 0    dorzolamide-timoloL (COSOPT) 22.3-6.8 mg/mL ophthalmic solution Administer 1 drop to both eyes Two (2) times a day. 10 mL 12    dulaglutide (TRULICITY) 0.75 mg/0.5 mL injection pen Inject the contents of 1 pen (0.75 mg total) under the skin every seven (7) days. 2 mL 3    DULoxetine (CYMBALTA) 60 MG capsule Take 1 capsule (60 mg total) by mouth daily. 90 capsule 1    empagliflozin (JARDIANCE) 25 mg tablet Take 1 tablet (25  mg total) by mouth daily. 90 tablet 3    empty container Misc Use as directed to dispose of Humira pens. 1 each 2    flash glucose sensor (FREESTYLE LIBRE 2 SENSOR) kit Use to monitor blood glucose levels continuously. Change sensor every 10 days 3 each 11    fluticasone propionate (FLONASE) 50 mcg/actuation nasal spray 1 spray into each nostril two (2) times a day. 48 g 1    folic acid/multivit-min/lutein (CENTRUM SILVER ORAL) Take 1 tablet by mouth daily at 0600.       HUMIRA PEN CITRATE FREE 40 MG/0.4 ML Inject 0.4 mL (40 mg total) under the skin every seven (7) days. 4 each 11    HUMIRA PEN CITRATE FREE STARTER PACK FOR CROHN'S/UC/HS 3 X 80 MG/0.8 ML Inject the contents of 2 pens (160 mg) under the skin on day 1, THEN 1 pen (80 mg) on day 15. 3 each 0    ibuprofen (MOTRIN) 600 MG tablet Take 1 tablet (600 mg total) by mouth every six (6) hours as needed for pain. 30 tablet 2    insulin glargine (BASAGLAR, LANTUS) 100 unit/mL (3 mL) injection pen Inject 0.5 mL (50 Units total) under the skin nightly. 15 mL 12    losartan (COZAAR) 25 MG tablet Take 1 tablet (25 mg total) by mouth daily. 90 tablet 3    metFORMIN (GLUCOPHAGE) 1000 MG tablet Take 1 tablet (1,000 mg total) by mouth 2 (two) times a day with meals. 180 tablet 3    omeprazole (PRILOSEC) 20 MG capsule Take 1 capsule (20 mg total) by mouth daily. 90 capsule 3    pen needle, diabetic (ULTICARE PEN NEEDLE) 32 gauge x 5/32 (4 mm) Ndle Use as directed two (2) times a day 100 each 12    pregabalin (LYRICA) 50 MG capsule Take 1 capsule (50 mg total) by mouth daily for 7 days, THEN 1 capsule (50 mg total) Two (2) times a day. 113 capsule 0    traZODone (DESYREL) 50 MG tablet Take 1 tablet (50 mg total) by mouth nightly. 90 tablet 3    blood-glucose meter kit Use as instructed 1 each 0    folic acid (FOLVITE) 1 MG tablet Take 1 tablet (1 mg total) by mouth daily. 30 tablet 11    lancing device Misc USE 4 TIMES DAILY BEFORE MEALS AND NIGHTLY ( USAR CUATRO VECES AL DIA SPBM Y TODAS LAS NOCHES ) 1 each 1    methotrexate 2.5 MG tablet Take 6 tablets (15 mg total) by mouth once a week for 14 days, THEN 8 tablets (20 mg total) once a week. 44 tablet 0    predniSONE (DELTASONE) 10 MG tablet  (Patient not taking: Reported on 12/27/2021)      predniSONE (DELTASONE) 20 MG tablet Take 1 tablet (20 mg total) by mouth daily. (Patient not taking: Reported on 10/01/2021) 30 tablet 0     No current facility-administered medications for this visit.     Facility-Administered Medications Ordered in Other Visits   Medication Dose Route Frequency Provider Last Rate Last Admin    matrix hemostatic sealant (FLOSEAL) topical             matrix hemostatic sealant (FLOSEAL) topical                Vitals:    12/27/21 1506   BP: 108/72   BP Site: L Arm   BP Position: Sitting   BP Cuff Size: Medium   Pulse:  95   Weight: 84.6 kg (186 lb 9.6 oz)   Height: 152.4 cm (5')         Physical Exam  - GENERAL: WD/WN, in mild distress 2/2 pain  - HEAD: normocephalic, atraumatic  - EYES: no scleral icterus, EOMI  - ENT: MMM, no nasal or oral lesions, oropharynx without erythema   - NECK: supple, no palpable lymphadenopathy  - CARDIO: RRR, no murmurs  - PULM: CTAB, symmetric air movement  - SKIN: no visible rashes on the head, neck, or extremities; did not examine axilla or groin  - NEURO: alert and oriented x 3    - MSK:  Unable to abduct both shoulders > 90 degrees   Significant pain with ROM of right shoulder  Left wrist with swelling, warmth, and painful ROM  Right 3rd PIP with TTP  Diffusely tender to palpation of the anterior chest and posterior neck / back musculature, as well as muscles of proximal and distal BUE and BLE; tearful during parts of examination 02/28/2011 1510    K 4.2 01/29/2021 1408    K 4.5 02/28/2011 1510    CL 101 01/29/2021 1408    CL 106 02/28/2011 1510    CO2 26.0 01/29/2021 1408    CO2 26 02/28/2011 1510    BUN 15 01/29/2021 1408    BUN 11 02/28/2011 1510    CREATININE 0.70 01/29/2021 1408    CREATININE 0.8 10/30/2019 1322    GLU 344 (H) 01/29/2021 1408        Component Value Date/Time    CALCIUM 9.4 01/29/2021 1408    CALCIUM 9.1 02/28/2011 1510    ALKPHOS 104 01/29/2021 1408    ALKPHOS 101 02/28/2011 1510    AST 22 01/29/2021 1408    AST 23 02/28/2011 1510    ALT 16 01/29/2021 1408    ALT 28 02/28/2011 1510    BILITOT 0.3 01/29/2021 1408    BILITOT 0.6 02/28/2011 1510          Lab Results   Component Value Date    ANA Negative 12/30/2019       Lab Results   Component Value Date    ESR 56 (H) 12/30/2019    RF 4.9 12/30/2019       Lab Results   Component Value Date    CREATUR 58.7 10/13/2021       No results found for: EJO1, ERNP, ESCL, ESMA, ESSA, ESSB    Lab Results   Component Value Date    HBSAG Nonreactive 06/09/2021    HEPBSAB Nonreactive 06/09/2021    QFTTBGOLD Negative 06/09/2021    HEPCAB Nonreactive 11/27/2018       Lab Results   Component Value Date    NITRITE Negative 11/27/2018    PROTEINUA Negative 06/20/2017    RBCUA 1 06/20/2017    WBC 6.8 01/29/2021    BLOODU Negative 06/20/2017    KETONESU Negative 06/20/2017       Lab Results   Component Value Date    ANCAIFA Negative 12/30/2019    MPO Negative 12/30/2019    MPOQT 0.7 12/30/2019    PR3ELISA Negative 12/30/2019       Humphrey Visual Field - OU - Both Eyes  Laterality: both eyes. Patient cooperation was good.     Notes   - OD: MD: -27.23; small inferior central island; GHT ONL (VFI: 19%)    - OS: MD: -25.81 small inf/central island; GHT ONL (VFI: 24%)- improved   from prior  OCT,  Optic Nerve - OU - Both Eyes  Laterality: both eyes. Patient cooperation was good.     Notes  PERFORMED BY LINDSAY JONES, COA    - OD:  AT 61 [DA 1.68], superior and inferior thinning - stable  - OS:  AT 78 [DA 1.61], superior and inferior thinning  - stable

## 2021-12-27 NOTE — Unmapped (Signed)
Methotrexate (Rheumatrex, Trexall, Otrexup, Rasuvo) (Information from the [employer] of Rheumatology)    Methotrexate is one of the most effective and commonly used medications in the treatment of rheumatoid arthritis and other forms of inflammatory arthritis, and also may be used to treat lupus, inflammatory myositis, vasculitis, and some forms of childhood arthritis. It is often used in combination with other medications to treat arthritis. It is known as a disease-modifying anti-rheumatic drug (DMARD), because it not only decreases the pain and swelling of arthritis, but it also can decrease damage to joints and long-term disability.    How to Take It  Methotrexate comes either as pills or as a subcutaneous injection. Methotrexate is usually taken as a single dose once per week, although occasionally the dose is split into two doses, taken once per week, to improve absorption or avoid side effects. Your doctor also may prescribe a folic acid (or folate) vitamin supplement to decrease the chance of side effects. Methotrexate should not be taken if kidney or liver function is not normal. Alcohol significantly increases the risk for liver damage while taking methotrexate, so alcohol should be avoided. Regular laboratory monitoring is required to monitor blood counts and your liver while taking methotrexate. Improvements in arthritis and other conditions usually are first seen in three - six weeks. The full benefit of this drug may not be seen until after 12 weeks of treatment.    Side Effects  Methotrexate can lower the ability of your immune system to fight infections. If you develop symptoms of an infection while using this medication, you should stop it and contact your doctor. The most common side effects are gastrointestinal upset and elevations of liver function tests.    About 1 - 3% of patients develop mouth sores (called stomatitis), rash, diarrhea, and abnormalities in blood counts. Some side effects do not cause symptoms, so it is important to have routine blood tests performed every 8 - 12 weeks.    Methotrexate may cause cirrhosis (scarring) of the liver, but this side effect is rare and most likely to occur in patients who already have liver problems or are using alcohol or taking other drugs that are toxic to the liver. Lung problems (persistent cough or unexplained shortness of breath) can occur rarely when taking methotrexate. Slow hair loss is seen in some patients, but hair grows back when the person stops taking this medication. This can often be managed by taking folic acid. It is important to remember that most patients do not experience side effects, and that, for those who do, many of the minor side effects will improve with time.      Tell Your Doctor  You should contact your doctor if you develop symptoms of an infection, such as a fever or cough, or if you think you are having any side effects. Be sure to let your doctor know if you are pregnant, planning to get pregnant, or if you are breastfeeding.  Methotrexate treatment should be discontinued for at least three months before attempting to become pregnant. Even though methotrexate should not be taken during pregnancy, it does not reduce a woman???s chance of becoming pregnant in the future. Men taking methotrexate should talk to their physician prior to attempts to conceive. If you are planning on having surgery or will be receiving chemotherapy or radiation therapy, talk to your doctor first.    Updated March 2017 by Cherylann Parr, MD and reviewed by the [facility] of Rheumatology Committee on Communications and  Marketing.    This patient fact sheet is provided for general education only. Individuals should consult a qualified health care provider for professional medical advice, diagnosis and treatment of a medical or health condition.    ?? 2017 [employer]

## 2021-12-28 DIAGNOSIS — M792 Neuralgia and neuritis, unspecified: Principal | ICD-10-CM

## 2021-12-28 DIAGNOSIS — M2550 Pain in unspecified joint: Principal | ICD-10-CM

## 2021-12-28 MED ORDER — DULOXETINE 60 MG CAPSULE,DELAYED RELEASE
ORAL_CAPSULE | Freq: Every day | ORAL | 1 refills | 90 days | Status: CP
Start: 2021-12-28 — End: 2022-12-28
  Filled 2021-12-29: qty 90, 90d supply, fill #0

## 2021-12-28 NOTE — Unmapped (Signed)
Patient is requesting the following refill  Requested Prescriptions     Pending Prescriptions Disp Refills    DULoxetine (CYMBALTA) 60 MG capsule 90 capsule 1     Sig: Take 1 capsule (60 mg total) by mouth daily.       Recent Visits  Date Type Provider Dept   10/13/21 Office Visit Deneise Lever, FNP [facility] [address] [facility]   07/13/21 Office Visit Johnn Hai, Loleta Rose, FNP [place] [facility] [address] [facility]   05/31/21 Office Visit Deneise Lever, FNP [place] [facility]   02/24/21 Office Visit Nile Dear, Emogene Morgan, NP [place] [facility]   01/29/21 Office Visit Deneise Lever, FNP [place] [facility]   Showing recent visits within past 365 days with a meds authorizing provider and meeting all other requirements  Future Appointments  Date Type Provider Dept   02/01/22 Appointment Deneise Lever, FNP  [facility] [address] [facility]   Showing future appointments within next 365 days with a meds authorizing provider and meeting all other requirements       Labs: PHQ9:   PHQ-9 PHQ-9 TOTAL SCORE   01/29/2021   1:00 PM 9      GAD7:   GAD7 Total Score GAD-7 Total Score   01/29/2021   1:00 PM 3

## 2021-12-28 NOTE — Unmapped (Signed)
Addended by: Dell Ponto on: 12/28/2021 12:27 PM     Modules accepted: Level of Service

## 2021-12-30 DIAGNOSIS — R059 Cough, unspecified type: Principal | ICD-10-CM

## 2021-12-30 MED ORDER — CETIRIZINE 10 MG TABLET
ORAL_TABLET | Freq: Every day | ORAL | 1 refills | 90 days | Status: CP
Start: 2021-12-30 — End: 2022-12-30
  Filled 2022-01-05: qty 30, 30d supply, fill #0

## 2021-12-30 NOTE — Unmapped (Signed)
[facility] Refill Coordination Note    Specialty Medication(s) to be Shipped:   Inflammatory Disorders: Humira    Other medication(s) to be shipped: cetirizine, basaglar, trulicity and omeprazole     Anne Barber, DOB: Mar 28, 1961  Phone: [phone] (home)       All above HIPAA information was verified with patient's family member, daughter.     Was a Nurse, learning disability used for this call? No    Completed refill call assessment today to schedule patient's medication shipment from the [facility] [phone]).  All relevant notes have been reviewed.     Specialty medication(s) and dose(s) confirmed: Regimen is correct and unchanged.   Changes to medications: [name] reports no changes at this time.  Changes to insurance: No  New side effects reported not previously addressed with a pharmacist or physician: None reported  Questions for the pharmacist: No    Confirmed patient received a Conservation officer, historic buildings and a Surveyor, mining with first shipment. The patient will receive a drug information handout for each medication shipped and additional FDA Medication Guides as required.       DISEASE/MEDICATION-SPECIFIC INFORMATION        For patients on injectable medications: Patient currently has 1 doses left.  Next injection is scheduled for 10/9.    SPECIALTY MEDICATION ADHERENCE     Medication Adherence    Patient reported X missed doses in the last month: 0  Specialty Medication: Humira  Patient is on additional specialty medications: No  Any gaps in refill history greater than 2 weeks in the last 3 months: no  Demonstrates understanding of importance of adherence: yes  Informant: child/children  Reliability of informant: reliable              Confirmed plan for next specialty medication refill: delivery by pharmacy  Refills needed for supportive medications: yes, ordered or provider notified              Were doses missed due to medication being on hold? No    Humira 40mg /0.62ml: Patient has 7 days of medication on hand    REFERRAL TO PHARMACIST     Referral to the pharmacist: Not needed      [facility]     Shipping address confirmed in Epic.     Delivery Scheduled: Yes, Expected medication delivery date: 10/11.     Medication will be delivered via Same Day Courier to the prescription address in Epic WAM.    Valere Dross   [facility] Specialty Technician

## 2022-01-05 MED FILL — OMEPRAZOLE 20 MG CAPSULE,DELAYED RELEASE: ORAL | 90 days supply | Qty: 90 | Fill #3

## 2022-01-05 MED FILL — INSULIN GLARGINE (U-100) 100 UNIT/ML (3 ML) SUBCUTANEOUS PEN: SUBCUTANEOUS | 30 days supply | Qty: 15 | Fill #5

## 2022-01-05 MED FILL — TRULICITY 0.75 MG/0.5 ML SUBCUTANEOUS PEN INJECTOR: SUBCUTANEOUS | 28 days supply | Qty: 2 | Fill #3

## 2022-01-05 MED FILL — HUMIRA PEN CITRATE FREE 40 MG/0.4 ML: SUBCUTANEOUS | 28 days supply | Qty: 4 | Fill #7

## 2022-01-27 DIAGNOSIS — G4700 Insomnia, unspecified: Principal | ICD-10-CM

## 2022-01-27 DIAGNOSIS — E11319 Type 2 diabetes mellitus with unspecified diabetic retinopathy without macular edema: Principal | ICD-10-CM

## 2022-01-27 DIAGNOSIS — Z794 Long term (current) use of insulin: Principal | ICD-10-CM

## 2022-01-27 DIAGNOSIS — I6523 Occlusion and stenosis of bilateral carotid arteries: Principal | ICD-10-CM

## 2022-01-27 MED ORDER — METFORMIN 1,000 MG TABLET
ORAL_TABLET | Freq: Two times a day (BID) | ORAL | 3 refills | 90 days | Status: CP
Start: 2022-01-27 — End: 2023-01-27
  Filled 2022-02-04: qty 180, 90d supply, fill #0

## 2022-01-27 MED ORDER — ASPIRIN 81 MG TABLET,DELAYED RELEASE
ORAL_TABLET | Freq: Every day | ORAL | 3 refills | 90 days | Status: CP
Start: 2022-01-27 — End: 2023-01-27

## 2022-01-27 MED ORDER — TRAZODONE 50 MG TABLET
ORAL_TABLET | Freq: Every evening | ORAL | 3 refills | 90 days | Status: CP
Start: 2022-01-27 — End: 2022-03-28
  Filled 2022-02-04: qty 90, 90d supply, fill #0

## 2022-01-27 MED ORDER — TRULICITY 0.75 MG/0.5 ML SUBCUTANEOUS PEN INJECTOR
SUBCUTANEOUS | 3 refills | 28 days | Status: CP
Start: 2022-01-27 — End: ?
  Filled 2022-02-04: qty 2, 28d supply, fill #0

## 2022-01-27 NOTE — Unmapped (Signed)
Patient is requesting the following refill  Requested Prescriptions     Pending Prescriptions Disp Refills    aspirin (ECOTRIN) 81 MG tablet 150 tablet 2     Sig: Take 1 tablet (81 mg total) by mouth daily.    metFORMIN (GLUCOPHAGE) 1000 MG tablet 180 tablet 3     Sig: Take 1 tablet (1,000 mg total) by mouth 2 (two) times a day with meals.    traZODone (DESYREL) 50 MG tablet 90 tablet 3     Sig: Take 1 tablet (50 mg total) by mouth nightly.    dulaglutide (TRULICITY) 0.75 mg/0.5 mL injection pen 2 mL 3     Sig: Inject the contents of 1 pen (0.75 mg total) under the skin every seven (7) days.       Recent Visits  Date Type Provider Dept   10/13/21 Office Visit Deneise Lever, FNP [facility] [address] [facility]   07/13/21 Office Visit Johnn Hai, Loleta Rose, FNP [facility] [address] [facility]   05/31/21 Office Visit Deneise Lever, FNP [facility]   02/24/21 Office Visit Nile Dear, Emogene Morgan, NP [facility]   01/29/21 Office Visit Deneise Lever, FNP [facility]   Showing recent visits within past 365 days with a meds authorizing provider and meeting all other requirements  Future Appointments  Date Type Provider Dept   02/01/22 Appointment Deneise Lever, FNP [facility] [address] [facility]   Showing future appointments within next 365 days with a meds authorizing provider and meeting all other requirements

## 2022-02-01 ENCOUNTER — Ambulatory Visit: Admit: 2022-02-01 | Discharge: 2022-02-02 | Payer: PRIVATE HEALTH INSURANCE

## 2022-02-01 DIAGNOSIS — K5900 Constipation, unspecified: Principal | ICD-10-CM

## 2022-02-01 DIAGNOSIS — E785 Hyperlipidemia, unspecified: Principal | ICD-10-CM

## 2022-02-01 DIAGNOSIS — L732 Hidradenitis suppurativa: Principal | ICD-10-CM

## 2022-02-01 DIAGNOSIS — I10 Essential (primary) hypertension: Principal | ICD-10-CM

## 2022-02-01 DIAGNOSIS — Z794 Long term (current) use of insulin: Principal | ICD-10-CM

## 2022-02-01 DIAGNOSIS — M2550 Pain in unspecified joint: Principal | ICD-10-CM

## 2022-02-01 DIAGNOSIS — I6523 Occlusion and stenosis of bilateral carotid arteries: Principal | ICD-10-CM

## 2022-02-01 DIAGNOSIS — M797 Fibromyalgia: Principal | ICD-10-CM

## 2022-02-01 DIAGNOSIS — K219 Gastro-esophageal reflux disease without esophagitis: Principal | ICD-10-CM

## 2022-02-01 DIAGNOSIS — E11319 Type 2 diabetes mellitus with unspecified diabetic retinopathy without macular edema: Principal | ICD-10-CM

## 2022-02-01 MED ORDER — POLYETHYLENE GLYCOL 3350 17 GRAM/DOSE ORAL POWDER
Freq: Every day | ORAL | 11 refills | 14 days | Status: CP
Start: 2022-02-01 — End: ?
  Filled 2022-02-04: qty 238, 14d supply, fill #0

## 2022-02-01 MED ORDER — METHOTREXATE SODIUM 2.5 MG TABLET
ORAL_TABLET | ORAL | 0 refills | 84 days | Status: CP
Start: 2022-02-01 — End: ?
  Filled 2022-02-04: qty 36, 28d supply, fill #0

## 2022-02-01 NOTE — Unmapped (Signed)
[facility] Refill Coordination Note    Specialty Medication(s) to be Shipped:   Inflammatory Disorders: Humira    Other medication(s) to be shipped:  trulicity , basaglar , aspirin , pen needles , foliic acid , metformin , trazodone , methotrexate and glycolax     Anne Barber, DOB: [birth year]  Phone: [phone] (home)       All above HIPAA information was verified with patient's family member, daughter .     Was a Nurse, learning disability used for this call? No    Completed refill call assessment today to schedule patient's medication shipment from the [facility] [phone]).  All relevant notes have been reviewed.     Specialty medication(s) and dose(s) confirmed: Regimen is correct and unchanged.   Changes to medications: Anne Barber reports no changes at this time.  Changes to insurance: No  New side effects reported not previously addressed with a pharmacist or physician: None reported  Questions for the pharmacist: No    Confirmed patient received a Conservation officer, historic buildings and a Surveyor, mining with first shipment. The patient will receive a drug information handout for each medication shipped and additional FDA Medication Guides as required.       DISEASE/MEDICATION-SPECIFIC INFORMATION        For patients on injectable medications: Patient currently has 0 doses left.  Next injection is scheduled for 02/09/22.    SPECIALTY MEDICATION ADHERENCE     Medication Adherence    Patient reported X missed doses in the last month: 0  Specialty Medication: Humira  Patient is on additional specialty medications: No                                Were doses missed due to medication being on hold? No        REFERRAL TO PHARMACIST     Referral to the pharmacist: Not needed      [facility]     Shipping address confirmed in Epic.     Delivery Scheduled: Yes, Expected medication delivery date: 11/10//23.     Medication will be delivered via Same Day Courier to the prescription address in Epic WAM.    Anne Barber   [facility] Specialty Technician

## 2022-02-01 NOTE — Unmapped (Signed)
Methotrexate refill  Last Visit Date: 12/27/2021  Next Visit Date: 04/22/2022    Lab Results   Component Value Date    ALT 12 12/27/2021    AST 22 12/27/2021    ALBUMIN 3.6 12/27/2021    CREATININE 0.80 12/27/2021     Lab Results   Component Value Date    WBC 6.2 12/27/2021    HGB 11.2 (L) 12/27/2021    HCT 34.3 12/27/2021    PLT 272 12/27/2021     Lab Results   Component Value Date    NEUTROPCT 71.4 12/27/2021    LYMPHOPCT 20.8 12/27/2021    MONOPCT 5.3 12/27/2021    EOSPCT 2.0 12/27/2021    BASOPCT 0.5 12/27/2021

## 2022-02-01 NOTE — Unmapped (Signed)
Assessment and Plan:     Anne Barber was seen today for follow-up.    Diagnoses and all orders for this visit:    Type 2 diabetes mellitus with retinopathy, with long-term current use of insulin, macular edema presence unspecified, unspecified laterality, unspecified retinopathy severity (CMS-HCC)          -     Last A1c 6.8 (7/23), 8.2 (3/23), 7.5 (11/22)        -     Metformin 1000 mg BID, Trulicity 0.75 mg weekly, 50 U lantus, 25 mg jardiance        -     POCT glycosylated hemoglobin (Hb A1C): 6.7        -     No hypoglycemia, advised to continue to monitor, if less than 70--can consider decrease amount of insulin  -     INFLUENZA VACCINE (QUAD) IM - 6 MO-ADULT - PF  -     HM DIABETES FOOT EXAM    Constipation, unspecified constipation type          -     Likely side effect of Trulicity, fluids, fiber, rx for miralax  -     polyethylene glycol (GLYCOLAX) 17 gram/dose powder; Take 17 g by mouth daily.    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         -     Taking Humira, methotrexate, 60 mg duloxetine, 50 mg lyrica BID         -     Followed by rheumatology, pain clinic, PT--has upcoming appointments     Gastroesophageal reflux disease, unspecified whether esophagitis present           -     Taking PPI--small and frequent meals, avoidance of trigger foods    Hearing loss: followed by [facility]  Glaucoma/retinal hemorrhage: followed by [facility]  Vaccine counseling-information on RSV vaccine, discussed COVID booster    Other orders  -     INFLUENZA VACCINE (QUAD) IM - 6 MO-ADULT - PF        Barriers to recommended plan: None identified    Return if symptoms worsen or fail to improve, for 3-4 months .      Subjective:     HPI: Anne Barber is a 61 y.o. female here for Follow-up.    Follow-up on chronic conditions:    DM:  Last A1c 6.8 (7/23), 8.2 (3/23), 7.5 (11/22)  Metformin 1000 mg BID, Trulicity 0.75 mg weekly, 50 U lantus, 25 mg jardiance  States highest blood sugar 140s, lowest 78, 98  Eating 2 meals per day. 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 Rheumatology though patient may have antibody formation to adalimumab. Started on methotrexate , managed by rheumatology, last visit 12/27/21, next visit 12/23    Fibromyalgia: Followed by pain management services. Taking 60 mg duloxetine, lyrica 50 mg BID, recommended aquatic PT, last visit 12/17/21, next visit 02/11/22  Has upcoming PT appointment    HS: Taking Humira, topical clindamycin, managed by dermatology, next visit 12/23, concern for antibody formation to this medication     Glaucoma/impair vision/vitreous hemorrhage/retinopathy: managed by [facility]-last visit 07/23: taking Cosopt    Overall, pt states pain levels slowly improving, has good days and bad days, ambulating more in home, has rolling walker to use at home when needed    Insomnia: Taking 50 mg trazodone, feels minimally effective for sleep-declined increase in dose    Flu shot today, discussed COVID booster and RSV vaccine  I have reviewed past medical, surgical, medications, allergies, social and family histories today and updated them in Epic where appropriate.    ROS:   PHQ-9 PHQ-9 TOTAL SCORE   02/01/2022  10:00 AM 2   01/29/2021   1:00 PM 9   01/20/2020   5:00 PM 7   11/05/2019   1:00 PM 11      GAD7 Total Score GAD-7 Total Score   02/01/2022  10:00 AM 0   01/29/2021   1:00 PM 3   01/20/2020   6:00 PM 7        Review of systems negative unless otherwise noted as per HPI.      Objective:     Vitals:    02/01/22 0949   BP: 128/72   Pulse: 93   Temp: 36.6 ??C (97.9 ??F)   SpO2: 98%     Body mass index is 35.13 kg/m??.    Physical Exam  Vitals and nursing note reviewed.   Constitutional:       Appearance: Normal appearance.   HENT:      Head: Normocephalic and atraumatic.   Cardiovascular:      Rate and Rhythm: Normal rate and regular rhythm.   Pulmonary:      Effort: Pulmonary effort is normal.      Breath sounds: Normal breath sounds.   Musculoskeletal:         General: Normal range of motion.      Cervical back: Normal range of motion and neck supple.   Skin:     General: Skin is warm.      Capillary Refill: Capillary refill takes less than 2 seconds.   Neurological:      General: No focal deficit present.      Mental Status: She is alert and oriented to person, place, and time. Mental status is at baseline.   Psychiatric:         Mood and Affect: Mood normal.         Behavior: Behavior normal.         Thought Content: Thought content normal.         Judgment: Judgment normal.            Medication adherence and barriers to the treatment plan have been addressed. Opportunities to optimize healthy behaviors have been discussed. Patient / caregiver voiced understanding.   I personally spent 35  minutes face-to-face and non-face-to-face in the care of this patient, which includes all pre, intra, and post visit time on the date of service.  Cleon Dew, DNP, FNP-C  [facility]  [phone] (F)    Note - This record has been created using [employer]. Chart creation errors have been sought, but may not always have been located. Such creation errors do not reflect on the standard of medical care.

## 2022-02-04 ENCOUNTER — Ambulatory Visit: Admit: 2022-02-04 | Discharge: 2022-02-05 | Payer: PRIVATE HEALTH INSURANCE

## 2022-02-04 DIAGNOSIS — M797 Fibromyalgia: Principal | ICD-10-CM

## 2022-02-04 MED ORDER — OXYCODONE-ACETAMINOPHEN 5 MG-325 MG TABLET
ORAL_TABLET | Freq: Every day | ORAL | 0 refills | 30 days | Status: CP | PRN
Start: 2022-02-04 — End: ?
  Filled 2022-02-04: qty 90, 90d supply, fill #0

## 2022-02-04 MED ORDER — DICLOFENAC 1 % TOPICAL GEL
Freq: Four times a day (QID) | TOPICAL | 0 refills | 13 days | Status: CP
Start: 2022-02-04 — End: 2023-02-04

## 2022-02-04 MED ORDER — PREGABALIN 25 MG CAPSULE
ORAL_CAPSULE | 0 refills | 0 days | Status: CP
Start: 2022-02-04 — End: ?

## 2022-02-04 MED FILL — HUMIRA PEN CITRATE FREE 40 MG/0.4 ML: SUBCUTANEOUS | 28 days supply | Qty: 4 | Fill #8

## 2022-02-04 MED FILL — FOLIC ACID 1 MG TABLET: ORAL | 30 days supply | Qty: 30 | Fill #1

## 2022-02-04 MED FILL — INSULIN GLARGINE (U-100) 100 UNIT/ML (3 ML) SUBCUTANEOUS PEN: SUBCUTANEOUS | 30 days supply | Qty: 15 | Fill #6

## 2022-02-04 MED FILL — ULTICARE PEN NEEDLE 32 GAUGE X 5/32" (4 MM): SUBCUTANEOUS | 50 days supply | Qty: 100 | Fill #3

## 2022-02-04 NOTE — Unmapped (Signed)
Department of Anesthesiology  [facility]  [address]  [place], [place] [address]  [phone]    Chronic Pain Follow-Up Note    Assessment and Plan  Attending: Ovee Pendleton is a 61 y.o. female with a PMHx significant for DM. She is being seen at the [facility] for arthralgia.    Per chart review, the patient was referred to our clinic by Dermatology. Possible Fibromyalgia patient. Patient endorsed muscle pain and weakness since missing a Humira dose in February. The patient was seen by an outside rheumatologist at [facility] who thought she had PMR vs Fibromyalgia. The patient was extremely uncomfortable on exam and in immense pain. She was noted to have inflammatory lesions on her hands that were supposedly transient. As such, Derm placed an urgent referral to [facility]. Would love to get their expertise and input regarding this patient, as there was much more going on then her skin disease.  Patient does have a history of glaucoma and is unable to take steroids per daughter per ophthalmologist.    1. Arthralgia, unspecified joint/ 2. Fibromyalgia  Patient presents with continued diffuse body pain throughout and recent diagnosis of Fibromyalgia. Her pain changes in severity and location every couple of days which is consistent with nature of Fibromyalgia pain. Upon exam, diffuse tenderness to palpation and hyperalgesia to light touch. Patient holds her joints throughout the entire visit.  Pain impacts her daily activities and limits functionality significantly. We encouraged exercises and movement given diagnosis of Fibromyalgia. She endorses benefit from the Lyrica that we initiated at last visit but does note that it causes some slight brain fog and drowsiness. Pt is interested in a dose decrease of Lyrica given it has provided some relief. Through shared decision making we also decided to start pt on low dose naltrexone and have percocet available PRN for breakthrough pain on days where the pain is very severe. She and daughter were agreeable to this plan. UDS and opioid agreement signed at last visit.       Patient does  appear to be utilizing pain medications appropriately and does  report that the medications do improve patient's quality of life and functionality level.  At today's visit, the patient reports poor analgesia from their current medication regimen without significant adverse effects.        1. Fibromyalgia          General Recommendations: The pain condition that the patient suffers from is best treated with a multidisciplinary approach that involves an increase in physical activity to prevent de-conditioning and worsening of the pain cycle, as well as psychological counseling (formal and/or informal) to address the co-morbid psychological affects of pain.  Treatment will often involve judicious use of pain medications and interventional procedures to decrease the pain, allowing the patient to participate in the physical activity that will ultimately produce long-lasting pain reductions.  The goal of the multidisciplinary approach is to return the patient to a higher level of overall function and to restore their ability to perform activities of daily living.      PLAN:  - Change Lyrica to 25 mg in the am and 50 mg nightly  - Start low dose naltrexone 4.5 mg daily, refilled x 1 month  - Start percocet 5-325 daily PRN for breakthrough pain, instructed pt to start with cutting pills in half and seeing how this helps her pain  - Start voltaren gel, apply to joints that  are painful      -Return in about 4 weeks (around 03/04/2022).    No orders of the defined types were placed in this encounter.    Requested Prescriptions     Signed Prescriptions Disp Refills    pregabalin (LYRICA) 25 MG capsule 90 capsule 0     Sig: Take 1 capsule in the morning and 2 capsules at night.    oxyCODONE-acetaminophen (PERCOCET) 5-325 mg per tablet 30 tablet 0     Sig: Take 1 tablet by mouth daily as needed for pain.    NON FORMULARY 30 each 1     Sig: Low dose Naltrexone 4.5 mg PO daily    diclofenac sodium (VOLTAREN) 1 % gel 100 g 0     Sig: Apply 2 g topically four (4) times a day.           HPI:  Ms. Schwantz is seen in consultation at the request of Marylene Land,*  For evaluation and recommendations regarding Her chronic pain.     At initial visit in September, the patient presents with diffuse body pain throughout and recent diagnosis of Fibromyalgia. Her pain changes in severity and location every couple of days which is consistent with nature of Fibromyalgia pain. She is currently taking Motrin prn and Cymbalta 60 mg daily with benefit. She has not tried Lyrica or gabapentin in the past. We discussed pharmaceutical management with gabapentin, Lyrica, or LDN in hopes of further analgesia. We checked recent labs and kidney functions were within normal limits. Upon exam, diffuse tenderness to palpation and hyperalgesia to light touch.  Patient holds her joints throughout the entire visit.  She also has significant swelling of her right index finger MCP joint.  Pain impacts her daily activities and limits functionality significantly. We discussed Pain Psych referral in hopes of improving coping skills as there is a component of anxiety and depression to her pain. We encouraged exercises and movement given diagnosis of Fibromyalgia and discusses placing a referral to aquatic PT for assistance with exercising and stretching. Patient was amenable to try Lyrica or gabapentin first, as well as referral to Pain Psych. We agreed to start Lyrica 50 mg BID and place referral to aquatic therapy in hopes of further analgesia. We could try LDN in the future, if patient does not tolerate Lyrica. Of note patient's daughter reports a pinpoint onset of her pain related to her discontinuation and reinitiation of her Humira medication.  We discussed that as her mother has significant hyperalgesia to touch as well as significant pain upon very light palpation of her right index finger joint as well as her elbows, there may be a component of inflammation that does not necessarily fit the picture of fibromyalgia.  We reached out to her rheumatologist for further investigation to assess whether patient has another condition necessitating labs or work-up.  Patient may also need to switch this medication depending on what her dermatologist/rheumatologist decide due to her significant disability occurring weekly.     Since last visit, the patient has followed with Rheumatology.     Today, pt endorses continued pain all over her body. She holds arm during entire interview and appears very uncomfortable. Daughter is with her and is her interpreter. She continues to endorse significant pain all over her body but worse in her right arm and hand today. She has followed with rheumatology who has started her on methotrexate and is working on titrating up. She states the lyrica has provided some  pain relief (maybe from a 9/10 to 6/10) but endorses some increased brain fog since starting it. She states the duloxetine makes her nauseous but takes it with food to help prevent this.    Pain Clinic? No    Current Medications:  Cymbalta 60 mg daily   Lyrica 50 mg BID    The patient states her pain is located neck, shoulders, hands, knees, feet, groin, low back and the severity of her pain ranges from 6/10 to 10/10.  Her pain currently is 8/10 and on average is 8/10.  She describes the sensation of her pain as aching, burning, dull, sore, tender. Her pain is present all of the time and worst at variable times. The patient???s pain impacts enjoyment of life, general activity, mood, recreational activities, sleep, walking, sitting, standing. Her interval history includes ER visit, PCP visit, death in the family, change in family structure, change in employment. Her pain has stayed the same, and she does have new pain to discuss today. She is not on blood thinners or anti-coagulants. In regards to medications currently taken for pain management, the patient is tolerating these medications well and complains of associated side effects: drowsiness/sleepiness, constipation, change in sleep patterns, dizziness, nausea/vomiting.    Previous interventions include Medications    Workmans Compensation is not involved.  This is not involved with a lawsuit or lawyer  The treatment goals include Complete resolution with medications if necessary    Previous Medication Trials: duloxetine/cymbalta, fluoxetine, and ibuprofen    Allergies as of 02/04/2022 - Reviewed 02/01/2022   Allergen Reaction Noted    Other  12/27/2021      Current Outpatient Medications   Medication Sig Dispense Refill    amLODIPine (NORVASC) 5 MG tablet Take 1 tablet (5 mg total) by mouth daily. 90 tablet 3    aspirin (ECOTRIN) 81 MG tablet Take 1 tablet (81 mg total) by mouth daily. 90 tablet 3    atorvastatin (LIPITOR) 40 MG tablet Take 1 tablet (40 mg total) by mouth daily. 90 tablet 3    BD INSULIN SYRINGE ULT-FINE II 1 mL 31 gauge x 5/16 Syrg 1 Package by Miscellaneous route Two (2) times a day. 100 each 3    blood sugar diagnostic (ONETOUCH ULTRA BLUE TEST STRIP) Strp USE TO CHECK BLOOD SUGAR UP TO 10 TIMES DAILY AS NEEDED 600 strip 1    blood-glucose meter kit Use as instructed 1 each 0    cetirizine (ZYRTEC) 10 MG tablet Take 1 tablet (10 mg total) by mouth daily. 90 tablet 1    clindamycin (CLEOCIN T) 1 % lotion Apply topically every morning. 60 mL 5    clindamycin (CLEOCIN T) 1 % Swab Apply 1 application topically Two (2) times a day. 60 each 11    dexamethasone (DECADRON) 0.1 % ophthalmic solution Administer 4 drops into the left ear Two (2) times a day for 30 days 5 mL 0    dextran 70-hypromellose, PF, (TEARS NATURAL FREE) 0.1-0.3 % Dpet       diclofenac sodium (VOLTAREN) 1 % gel Apply 2 g topically four (4) times a day. 100 g 0 dorzolamide-timoloL (COSOPT) 22.3-6.8 mg/mL ophthalmic solution Administer 1 drop to both eyes Two (2) times a day. 10 mL 12    dulaglutide (TRULICITY) 0.75 mg/0.5 mL injection pen Inject the contents of 1 pen (0.75 mg total) under the skin every seven (7) days. 2 mL 3    DULoxetine (CYMBALTA) 60 MG capsule Take 1 capsule (60 mg  total) by mouth daily. 90 capsule 1    empagliflozin (JARDIANCE) 25 mg tablet Take 1 tablet (25 mg total) by mouth daily. 90 tablet 3    empty container Misc Use as directed to dispose of Humira pens. 1 each 2    flash glucose sensor (FREESTYLE LIBRE 2 SENSOR) kit Use to monitor blood glucose levels continuously. Change sensor every 10 days 3 each 11    fluticasone propionate (FLONASE) 50 mcg/actuation nasal spray 1 spray into each nostril two (2) times a day. 48 g 1    folic acid (FOLVITE) 1 MG tablet Take 1 tablet (1 mg total) by mouth daily. 30 tablet 11    folic acid/multivit-min/lutein (CENTRUM SILVER ORAL) Take 1 tablet by mouth daily at 0600.       HUMIRA PEN CITRATE FREE 40 MG/0.4 ML Inject 0.4 mL (40 mg total) under the skin every seven (7) days. 4 each 11    HUMIRA PEN CITRATE FREE STARTER PACK FOR CROHN'S/UC/HS 3 X 80 MG/0.8 ML Inject the contents of 2 pens (160 mg) under the skin on day 1, THEN 1 pen (80 mg) on day 15. 3 each 0    ibuprofen (MOTRIN) 600 MG tablet Take 1 tablet (600 mg total) by mouth every six (6) hours as needed for pain. 30 tablet 2    insulin glargine (BASAGLAR, LANTUS) 100 unit/mL (3 mL) injection pen Inject 0.5 mL (50 Units total) under the skin nightly. 15 mL 12    lancing device Misc USE 4 TIMES DAILY BEFORE MEALS AND NIGHTLY ( USAR CUATRO VECES AL DIA SPBM Y TODAS LAS NOCHES ) 1 each 1    losartan (COZAAR) 25 MG tablet Take 1 tablet (25 mg total) by mouth daily. 90 tablet 3    metFORMIN (GLUCOPHAGE) 1000 MG tablet Take 1 tablet (1,000 mg total) by mouth 2 (two) times a day with meals. 180 tablet 3    methotrexate 2.5 MG tablet Take 8 tablets (20 mg total) by mouth once a week. 96 tablet 0    NON FORMULARY Low dose Naltrexone 4.5 mg PO daily 30 each 1    omeprazole (PRILOSEC) 20 MG capsule Take 1 capsule (20 mg total) by mouth daily. 90 capsule 3    oxyCODONE-acetaminophen (PERCOCET) 5-325 mg per tablet Take 1 tablet by mouth daily as needed for pain. 30 tablet 0    pen needle, diabetic (ULTICARE PEN NEEDLE) 32 gauge x 5/32 (4 mm) Ndle Use as directed two (2) times a day 100 each 12    polyethylene glycol (GLYCOLAX) 17 gram/dose powder Take 17 g by mouth daily. 238 g 11    predniSONE (DELTASONE) 10 MG tablet  (Patient not taking: Reported on 12/27/2021)      predniSONE (DELTASONE) 20 MG tablet Take 1 tablet (20 mg total) by mouth daily. (Patient not taking: Reported on 10/01/2021) 30 tablet 0    pregabalin (LYRICA) 25 MG capsule Take 1 capsule in the morning and 2 capsules at night. 90 capsule 0    traZODone (DESYREL) 50 MG tablet Take 1 tablet (50 mg total) by mouth nightly. 90 tablet 3     No current facility-administered medications for this visit.     Facility-Administered Medications Ordered in Other Visits   Medication Dose Route Frequency Provider Last Rate Last Admin    matrix hemostatic sealant (FLOSEAL) topical             matrix hemostatic sealant (FLOSEAL) topical  Lab Results   Component Value Date    CREATININE 0.80 12/27/2021       Lab Results   Component Value Date    ALKPHOS 128 (H) 12/27/2021    BILITOT 0.2 (L) 12/27/2021    PROT 7.5 12/27/2021    ALBUMIN 3.6 12/27/2021    ALT 12 12/27/2021    AST 22 12/27/2021       Lab Results   Component Value Date    PLT 272 12/27/2021         Urine toxiciology screen  No results found for: AMPHU, BARBU, BENZU, CANNAU, METHU, OPIAU, COCAU    OPIOID CONFIRMATION:  No results found for: CDIFFTOX, NBUPR, LABCO, HYDROCODONE, HYDROMORPH, MORPHINE, OXYCODONE, OXMU, MAMU, OPIU     BENZODIAZEPINE CONFIRMATION:  No results found for: CDIFFTOX, OHALP, CLONU, HDXYFLRAZUR, 7NHFLU, DESALKYCONF, LORAZURQT, HDXYTRIAZUR, MIDAZURQT, BNZU    Review of Systems:  General night sweats, difficulty sleeping  Cardiovascular chest pain, swelling of ankles/legs  Gastrointestinal nausea, constipation, and heartburn  Skin dryness  Endocrine increased sweating, thinning hair  Musculoskeletal joint aches/swelling, spasms/spacticity/cramps, back pain, muscle aches/weakness  Neurologic numbness/tingling  Psychiatric depression, low concentration, low energy, emotional outbursts, lack of interest in activities, feeling hopeless/helpless, isoliting yourself    She denies any homicidal or suicidal ideation.         PHYSICAL EXAM:  BP 102/69  - Pulse 86  - Temp 36.3 ??C (97.3 ??F) (Skin)  - Resp 16  - Ht 152.4 cm (5')  - Wt 81.6 kg (179 lb 14.4 oz)  - SpO2 98%  - BMI 35.13 kg/m??   Wt Readings from Last 3 Encounters:   02/04/22 81.6 kg (179 lb 14.4 oz)   02/01/22 81.6 kg (179 lb 14.4 oz)   12/27/21 84.6 kg (186 lb 9.6 oz)     GENERAL:  The patient is well developed, well-nourished, and appears to be in no apparent distress.   HEAD/NECK:    Normocephalic/atraumatic. clear sclera, pupils not pinpoint  CV:  Appears well perfused  LUNGS:   Normal work of breathing, no supplemental O2  EXTREMITIES:  No clubbing, cyanosis noted.  NEUROLOGIC:    The patient is alert and oriented, speech fluent, normal language.   MUSCULOSKELETAL:    Motor function  preserved. Good range of motion of all extremities.   GAIT:  The patient rises from a seated position with no difficulty and ambulates with nonantalgic gait without the assistance of a walking aid.   SKIN:   No obvious rashes, lesions, or erythema. Hyperalgesia to touch.  PSY:   Appropriate affect. No overt pain behaviors. No evidence of psychomotor retardation or agitation, no signs of intoxication.

## 2022-02-04 NOTE — Unmapped (Signed)
Pt spanish speaking. Pt had daughter at today's visit. Daughter refused [facility] interpreter services. Refusal for interpreter service form given to daughter for signature. Will scan into pt chart.

## 2022-02-04 NOTE — Unmapped (Addendum)
It was good to see you today!    We have changed some of your medications today. For the Lyrica (pregabalin) you should take 25mg  (1 capsule) in the morning and 50 mg (2 capsules) at night.    We have also started you on low dose naltrexone. You should take one of these every day for it to build up in your system.    We have also prescribed you percocet that you can take as needed when your pain is very severe. You can cut these tablets in half at first to see if that helps you.    Thank you for visiting the [facility] today.  It was nice to see you again.      PLEASE BRING ALL YOUR PAIN MEDICATION PILLS IN THE ORIGINAL CONTAINERS TO EVERY CLINIC VISIT     Opiates (narcotics) are medicines used to relieve moderate to severe pain. They may be used for a short time for pain, such as after surgery. Or they may be used for long-term pain. They don't cure a health problem. But they help you manage the pain.  Opiates relieve pain by changing the way your body feels pain and the way you feel about pain. Opiates are powerful medicines. You may need to take extra steps to stay safe. Taking too much (overdose) of an opiate can cause death.     DO:    -Read the medication guide - ask you doctor if you have not received one.    -Take your medicine exactly as prescribed.    -Keep your medicine away from children and pets, or anyone who might steal or             misuse it. Store it in a safe and secure place. Also secure any unfilled    prescriptions.      -Flush unused medicine down the toilet.     DON'T:    -Share your medicine with others.  - Take any medicine unless it was prescribed for you.    -Stop taking your medicine without talking to your doctor.  - Break, chew, crush, dissolve, or inject your medicine. Do not cut or tear a patch.    -Drink alcohol or use illegal drugs while taking this medicine.    -Drive or operate machinery unless you feel entirely normal taking your medication (no drowsiness, foggy-headedness, or slowed reaction time).    -Take your medication in combination with a benzodiazepine medication (nerve pill       such as Valium, Xanax, Klonopin, Ativan) due to increased risk of overdose and       death.     Side effects  You may report side effects to the FDA at 1-800-FDA-1088.  Common side effects include:  ??         Constipation. It is important to treat this early and effectively - your doctor can help!  ??         Feeling dizzy or lightheaded. You may feel like you might faint.  ??         Feeling sleepy.  ??         Nausea or vomiting.  Other effects could include:  ??         Sexual difficulties or loss of interest.  ??         Dry mouth and dental problems.  ??         Mood changes.  ??  Cognitive impairment (not thinking or responding normally).     What to know about taking this medicine  ??         Your body gets used to opiates if you take them all the time. You could have withdrawal symptoms when you stop taking them. Symptoms include nausea, sweating, chills, diarrhea, and shaking. But you can avoid these symptoms if you slowly stop taking the medicine as your doctor tells you to.  ??         You have a small chance of addiction if you take opiates as prescribed. Your risk is a bit higher if you have abused drugs in the past.  ??         Some opiates have acetaminophen in them. Check the labels on all the other medicines you take. This includes over-the-counter drugs. Many medicines have acetaminophen. Do not take others with acetaminophen in them unless your doctor has told you to. Taking too much acetaminophen can be harmful. Talk to your doctor or pharmacist if you have questions about this.  ??         Be sure you know how to safely get rid of any leftover medicine. Talk to your doctor or pharmacist about how to do this. Ask for written instructions.     When should you call for help?  Call 911 anytime you think you may need emergency care. For example, call if:  ??         You have trouble breathing.  ??         You have swelling of your face, lips, tongue, or throat.  ??         You have signs of an overdose. These include:  ??       Cold, clammy skin.  ??       Confusion.  ??       Severe nervousness or restlessness.  ??       Severe dizziness, drowsiness, or weakness.  ??       Slow breathing.  ??       Seizures.  Call your doctor now or seek immediate medical care if:  ??         You have hives.  Watch closely for changes in your health, and be sure to contact your doctor if:  ??         Your medicine is not helping with the pain.  ??         You are having side effects, such as constipation.     Where can you learn more?  Go to http://[facility]  Enter F734 in the search box to learn more about Learning About Opiates.  ?? 2006-2014 [employer]. Care instructions adapted under license by [facility]. This care instruction is for use with your licensed healthcare professional. If you have questions about a medical condition or this instruction, always ask your healthcare professional. [employer] disclaims any warranty or liability for your use of this information.  Content Version: 10.0.270728; Last Revised: Aug 02, 2011

## 2022-02-09 DIAGNOSIS — M797 Fibromyalgia: Principal | ICD-10-CM

## 2022-02-09 MED ORDER — PREGABALIN 25 MG CAPSULE
ORAL_CAPSULE | 0 refills | 0 days | Status: CP
Start: 2022-02-09 — End: ?

## 2022-02-09 MED ORDER — DICLOFENAC 1 % TOPICAL GEL
Freq: Four times a day (QID) | TOPICAL | 0 refills | 13 days | Status: CP
Start: 2022-02-09 — End: 2023-02-09

## 2022-02-09 MED ORDER — OXYCODONE-ACETAMINOPHEN 5 MG-325 MG TABLET
ORAL_TABLET | Freq: Every day | ORAL | 0 refills | 30 days | Status: CP | PRN
Start: 2022-02-09 — End: ?

## 2022-02-09 NOTE — Unmapped (Signed)
I saw and evaluated the patient, participating in the key portions of the service.  I reviewed the resident’s note.  I agree with the resident’s findings and plan.

## 2022-02-10 NOTE — Unmapped (Signed)
[facility] PT AQUATIC THERAPY CARY  OUTPATIENT PHYSICAL THERAPY  02/11/2022          Patient Name: Anne Barber  Date of Birth:October 23, 1960  Diagnosis: No diagnosis found.  Referring Provider: Caffie Pinto    Date of Onset of Impairment: No date available  Date PT Care Plan Established or Reviewed: No date available  Date PT Treatment Started: No date available     Plan of Care Effective Date:   Session Number:  1    ASSESSMENT & PLAN   Assessment  Assessment details:      Asani is a pleasant 61 y.o. female who presents for Physical Therapy Evaluation with ***. Primary impairments include ***. Clinical presentation today is consistent with ***. *** The patient will benefit from skilled Physical Therapy intervention to address the {jmkcomplexity:78453} impairments listed below and to assist the patient in maximizing her functional independence and safe return to prior level of function.             Impairments: decreased endurance, pain, decreased strength, decreased range of motion and impaired motor control      Personal Factors/Comorbidities: 3+    Specific Comorbidities: HTN, DM2, chronic pain, fibromyalgia, depression with aniexty, impaired vision, multijoint pain    Examination of Body Systems: musculoskeletal, activity/participation and communication    Clinical Presentation: stable    Clinical Decision Making: low    Prognosis: good prognosis    Negative Prognosis Rationale: medical status/condition, chronicity of condition and severity of symptoms.      Therapy Goals      Goals:        1. In 12 weeks the patient will demonstrate independent performance of HEP to maintain functional gains.   2. In 12 weeks the patient will demonstrate *** to indicate progression towards return to PLOF.   3. In 12 weeks the patient will score {JMKOUTCOMES:65779} to indicate improved activity tolerance.   4. In 12 weeks the patient will {JMKGOALS:65806}        Plan    Therapy options: will be seen for skilled physical therapy services    Planned therapy interventions: Aquatic Therapy, Location manager, Education - Patient, Endurance Activites, Functional Mobility, Gait Training, Home Exercise Program, Manual Therapy, Neuromuscular Re-education, Therapeutic Activities, Therapeutic Exercises, Education - Family/Caregiver, Dry Needling, E-Stim, TENS, Self-Care/Home Training, Civil engineer, contracting and Diaphragmatic/Pursed-lip Breathing    DME Equipment: Theraband.    Frequency: 1x week    Duration in weeks: 12    Education provided to: patient.    Education provided: HEP, Treatment options and plan, Symptom management, Safety education, Importance of Therapy, Anatomy, Body mechanics, Role of therapy in Rehabilitation, Posture, Community resources and Body awareness    Education results: verbalized good understanding, demonstrates understanding and needs reinforcement.    Communication/Consultation: N/A.            SUBJECTIVE   Interpreter Use: {JMKINTERP:89310}    History of Present Condition      History of Present Condition/Chief Complaint:       Fibromyalgia [M79.7]  - Primary  Reason for Exam  Comments: Water therapy - fibromyalgia                      Hunger vital sign:  1. Within the past 12 months, we worried whether our food would run out before we got money to buy more. {hungeroptions:71899::Never True}  2. Within the past 12 months, the food we bought just didn't last, and we didn't have  money to get more. {hungeroptions:71899::Never True}    If patient identifies with either of the above, patient was provided with local food resource guide and non perishables when possible.    OBJECTIVE     General Observations  Pleasant female, NAD. Ambulates with {jmkAD:76117}    Posture  Sitting: {KC SITTING POSTURE:45277::unremarkable}  Standing: {KC Standing Posture:45279::unremarkable}      Range of Motion - Lumbar Spine     No ROM Loss  (0%) Min ROM Loss  (1-25%) Mod ROM Loss  (26-65%) Major ROM Loss  (66-100%) Symptoms    Flexion     {JMKLUMBARROM:65831}   Extension     {JMKLUMBARROM:65831}   Side Bend Right     {JMKLUMBARROM:65831}   Side Bend Left     {JMKLUMBARROM:65831}   Sideglide Right     {JMKLUMBARROM:65831}   Sideglide Left     {JMKLUMBARROM:65831}   Observable movement coordination impairments of the lumbopelvic region with flexion and extension movements or while performing daily physical activities:  {YESNO:90459::NO}         Thoracic Active Range of Motion  Flexion {jmklimited:68672}   Extension {jmklimited:68672}   R Rotation {jmklimited:68672}   L Rotation {jmklimited:68672}   R Side Flexion {jmklimited:68672}   L Side Flexion {jmklimited:68672}       Neurological  Strength, tested in sitting   Right Left   Hip Flexion (L2) {JMKMMT:65804::5/5} {JMKMMT:65804::5/5}   Hip Abduction {JMKMMT:65804::5/5} {JMKMMT:65804::5/5}   Hip Adduction {JMKMMT:65804::5/5} {JMKMMT:65804::5/5}   Knee Extension (L3) {JMKMMT:65804::5/5} {JMKMMT:65804::5/5}   Knee Flexion {JMKMMT:65804::5/5} {JMKMMT:65804::5/5}   Ankle DF (L4) {JMKMMT:65804::5/5} {JMKMMT:65804::5/5}   Great toe extension {JMKMMT:65804::5/5} {JMKMMT:65804::5/5}   Ankle PF (S1) {JMKMMT:65804::5/5} {JMKMMT:65804::5/5}     Muscle Endurance Measures  Trunk flexor strength (double-leg lowering test): ***   Trunk extensors strength (Sorensen test): ***   Lateral abdominals and hip abductors (eg, side plank): ***   Hip and thigh muscle performance (star excursion balance tests): ***     Denies Sensory Changes ***     Muscle Stretch Reflexes   Right Left   Patella (L4) {lbmsr:43155::2+} {lbmsr:43155::2+}   Achilles (S1) {lbmsr:43155::2+} {lbmsr:43155::2+}       Special Tests   Right Left   Slump {jmkposneg:66113} {jmkposneg:66113}   SLR {jmkposneg:66113} {jmkposneg:66113}   Babinski {jmkposneg:66113} {jmkposneg:66113}   Clonus {jmkposneg:66113} {jmkposneg:66113}       Palpation  TTP: ***    Transfers  Sit to stand: ***    Balance  SLS on level ground:  - Right: *** seconds  - Left: *** seconds    Gait  {Gait Analysis:48459::non-antalgic}    Written Outcome Measures  Revised Oswestry: ***  A score of 0-20% indicates minimal disability. 21-40% indicates moderate disability. 41-60% indicates severe disability. 61-80% indicates crippling disability. 81-100% indicates the patient may be bed bound and careful evaluation is recommended. The Minimal Detectable Change (MDC) is 10 points.   FABQ: ***   PCS: ***     Functional Outcome Measures  TUG: ***  5 STS: ***  30s STS: ***   2 MWT: ***           {JMKLBPTBC:95011}  Active Education one-on-one education on the bio-psychosocial contributors to pain and self-management techniques, such as remaining active, pacing strategies, and back-protection techniques. Reinforced the inherent strength of the human spine.               TREATMENT RENDERED     Therapeutic Exercise:  *** Minutes   Performed with direct PT demonstration, instruction, supervision, and guidance.   -  Education on condition, prognosis, and PT POC  -     HEP Access Code: ***  Next Visit Plan: ***      Total Treatment Time: *** Minutes                          I attest that I have reviewed the above information.  Signed: Barron Alvine, PT, DPT  02/10/2022 9:02 AM        I reviewed the no-show/attendance policy with the patient and caregiver(s). The patient is aware that they must call to cancel appointments more than 24 hours in advance. They are also aware that if they late cancel or no-show three times, we reserve the right to cancel their remaining appointments. This policy is in place to allow [place] to best serve the needs of our caseload.    If patient returns to clinic with variance in plan of care, then it may be attributable to one or more of the following factors: preferred clinician availability, appointment time request availability, therapy pool appointment availability, major holiday with clinic closure, caregiver availability, patient transportation, conflicting medical appointment, inclement weather, and/or patient illness.    If patient does not return for follow up visit(s) related to this episode of care, this note will serve as their discharge note from Physical Therapy. patient returns to clinic with variance in plan of care, then it may be attributable to one or more of the following factors: preferred clinician availability, appointment time request availability, therapy pool appointment availability, major holiday with clinic closure, caregiver availability, patient transportation, conflicting medical appointment, inclement weather, and/or patient illness.    If patient does not return for follow up visit(s) related to this episode of care, this note will serve as their discharge note from Physical Therapy.

## 2022-02-11 ENCOUNTER — Ambulatory Visit: Admit: 2022-02-11 | Payer: PRIVATE HEALTH INSURANCE

## 2022-02-28 MED ORDER — DICLOFENAC 1 % TOPICAL GEL
Freq: Four times a day (QID) | TOPICAL | 0 refills | 13 days
Start: 2022-02-28 — End: 2023-02-28

## 2022-02-28 MED ORDER — PREGABALIN 25 MG CAPSULE
ORAL_CAPSULE | 0 refills | 0 days
Start: 2022-02-28 — End: ?

## 2022-03-01 DIAGNOSIS — Z794 Long term (current) use of insulin: Principal | ICD-10-CM

## 2022-03-01 DIAGNOSIS — E11319 Type 2 diabetes mellitus with unspecified diabetic retinopathy without macular edema: Principal | ICD-10-CM

## 2022-03-01 MED ORDER — INSULIN GLARGINE (U-100) 100 UNIT/ML (3 ML) SUBCUTANEOUS PEN
Freq: Every evening | SUBCUTANEOUS | 1 refills | 90 days | Status: CP
Start: 2022-03-01 — End: 2023-03-01
  Filled 2022-03-02: qty 45, 90d supply, fill #0

## 2022-03-01 MED ORDER — TRULICITY 0.75 MG/0.5 ML SUBCUTANEOUS PEN INJECTOR
SUBCUTANEOUS | 1 refills | 84 days | Status: CP
Start: 2022-03-01 — End: 2023-03-01
  Filled 2022-03-02: qty 6, 84d supply, fill #0

## 2022-03-01 MED ORDER — FOLIC ACID 1 MG TABLET
ORAL_TABLET | Freq: Every day | ORAL | 3 refills | 30 days | Status: CP
Start: 2022-03-01 — End: ?
  Filled 2022-03-02: qty 30, 30d supply, fill #0

## 2022-03-01 NOTE — Unmapped (Signed)
FA refill  Last Visit Date: 12/27/2021  Next Visit Date: 04/22/2022

## 2022-03-01 NOTE — Unmapped (Signed)
[facility] Refill Coordination Note    Specialty Medication(s) to be Shipped:   Inflammatory Disorders: Humira    Other medication(s) to be shipped: basaglar, folic acid, methotrexate, amlodipine, atorvastatin, cetirizine, jardiance, losartan, trulicity and glycolax     Anne Barber, DOB: [birth year]  Phone: [phone] (home)       All above HIPAA information was verified with patient's family member, daughter .     Was a Nurse, learning disability used for this call? No    Completed refill call assessment today to schedule patient's medication shipment from the [facility] [phone]).  All relevant notes have been reviewed.     Specialty medication(s) and dose(s) confirmed: Regimen is correct and unchanged.   Changes to medications: Henchy reports no changes at this time.  Changes to insurance: No  New side effects reported not previously addressed with a pharmacist or physician: None reported  Questions for the pharmacist: No    Confirmed patient received a Conservation officer, historic buildings and a Surveyor, mining with first shipment. The patient will receive a drug information handout for each medication shipped and additional FDA Medication Guides as required.       DISEASE/MEDICATION-SPECIFIC INFORMATION        For patients on injectable medications: Patient currently has 1 doses left.  Next injection is scheduled for 12/6.    SPECIALTY MEDICATION ADHERENCE     Medication Adherence    Patient reported X missed doses in the last month: 0  Specialty Medication: Humira  Patient is on additional specialty medications: No  Any gaps in refill history greater than 2 weeks in the last 3 months: no  Demonstrates understanding of importance of adherence: yes  Informant: child/children  Reliability of informant: reliable              Confirmed plan for next specialty medication refill: delivery by pharmacy  Refills needed for supportive medications: yes, ordered or provider notified              Were doses missed due to medication being on hold? No     Humira CF 40 mg/0.4 ml : 7 days on hand      REFERRAL TO PHARMACIST     Referral to the pharmacist: Not needed      [facility]     Shipping address confirmed in Epic.     Delivery Scheduled: Yes, Expected medication delivery date: 12/6.     Medication will be delivered via Same Day Courier to the prescription address in Epic WAM.    Valere Dross   [facility] Specialty Technician

## 2022-03-02 MED ORDER — DICLOFENAC 1 % TOPICAL GEL
Freq: Four times a day (QID) | TOPICAL | 0 refills | 13 days
Start: 2022-03-02 — End: 2023-03-02

## 2022-03-02 MED ORDER — PREGABALIN 25 MG CAPSULE
ORAL_CAPSULE | 0 refills | 0 days
Start: 2022-03-02 — End: ?

## 2022-03-02 MED FILL — CETIRIZINE 10 MG TABLET: ORAL | 30 days supply | Qty: 30 | Fill #1

## 2022-03-02 MED FILL — METHOTREXATE SODIUM 2.5 MG TABLET: ORAL | 28 days supply | Qty: 32 | Fill #1

## 2022-03-02 MED FILL — ATORVASTATIN 40 MG TABLET: ORAL | 90 days supply | Qty: 90 | Fill #1

## 2022-03-02 MED FILL — AMLODIPINE 5 MG TABLET: ORAL | 90 days supply | Qty: 90 | Fill #1

## 2022-03-02 MED FILL — JARDIANCE 25 MG TABLET: ORAL | 90 days supply | Qty: 90 | Fill #3

## 2022-03-02 MED FILL — LOSARTAN 25 MG TABLET: ORAL | 90 days supply | Qty: 90 | Fill #3

## 2022-03-02 MED FILL — POLYETHYLENE GLYCOL 3350 17 GRAM/DOSE ORAL POWDER: ORAL | 14 days supply | Qty: 238 | Fill #1

## 2022-03-02 MED FILL — HUMIRA PEN CITRATE FREE 40 MG/0.4 ML: SUBCUTANEOUS | 28 days supply | Qty: 4 | Fill #9

## 2022-03-03 ENCOUNTER — Other Ambulatory Visit: Payer: Self-pay

## 2022-03-03 ENCOUNTER — Emergency Department
Admission: EM | Admit: 2022-03-03 | Discharge: 2022-03-03 | Disposition: A | Discharge status: Home / Self Care | Payer: Medicaid Other | Attending: Emergency Medicine | Admitting: Emergency Medicine

## 2022-03-03 ENCOUNTER — Emergency Department: Payer: Medicaid Other

## 2022-03-03 ENCOUNTER — Encounter: Payer: Self-pay | Admitting: Emergency Medicine

## 2022-03-03 ENCOUNTER — Ambulatory Visit: Admit: 2022-03-03 | Discharge: 2022-03-04 | Payer: PRIVATE HEALTH INSURANCE

## 2022-03-03 DIAGNOSIS — M797 Fibromyalgia: Principal | ICD-10-CM

## 2022-03-03 DIAGNOSIS — R051 Acute cough: Secondary | ICD-10-CM | POA: Diagnosis not present

## 2022-03-03 DIAGNOSIS — I10 Essential (primary) hypertension: Secondary | ICD-10-CM | POA: Insufficient documentation

## 2022-03-03 DIAGNOSIS — R0789 Other chest pain: Secondary | ICD-10-CM | POA: Diagnosis present

## 2022-03-03 DIAGNOSIS — Z20822 Contact with and (suspected) exposure to covid-19: Secondary | ICD-10-CM | POA: Insufficient documentation

## 2022-03-03 LAB — BASIC METABOLIC PANEL
Anion gap: 6 (ref 5–15)
BUN: 12 mg/dL (ref 8–23)
CO2: 25 mmol/L (ref 22–32)
Calcium: 8.2 mg/dL — ABNORMAL LOW (ref 8.9–10.3)
Chloride: 106 mmol/L (ref 98–111)
Creatinine, Ser: 0.53 mg/dL (ref 0.44–1.00)
GFR, Estimated: >60 mL/min (ref >60)
Glucose, Bld: 139 mg/dL — ABNORMAL HIGH (ref 70–99)
Potassium: 3.9 mmol/L (ref 3.5–5.1)
Sodium: 137 mmol/L (ref 135–145)

## 2022-03-03 LAB — CBC
HCT: 33.4 % — ABNORMAL LOW (ref 36.0–46.0)
Hemoglobin: 10.3 g/dL — ABNORMAL LOW (ref 12.0–15.0)
MCH: 24.6 pg — ABNORMAL LOW (ref 26.0–34.0)
MCHC: 30.8 g/dL (ref 30.0–36.0)
MCV: 79.9 fL — ABNORMAL LOW (ref 80.0–100.0)
Platelets: 364 10*3/uL (ref 150–400)
RBC: 4.18 MIL/uL (ref 3.87–5.11)
RDW: 15.8 % — ABNORMAL HIGH (ref 11.5–15.5)
WBC: 5 10*3/uL (ref 4.0–10.5)
nRBC: 0 % (ref 0.0–0.2)

## 2022-03-03 LAB — RESP PANEL BY RT-PCR (RSV, FLU A&B, COVID)  RVPGX2
Influenza A by PCR: NEGATIVE
Influenza B by PCR: NEGATIVE
Resp Syncytial Virus by PCR: NEGATIVE
SARS Coronavirus 2 by RT PCR: NEGATIVE

## 2022-03-03 LAB — LIPASE, BLOOD: Lipase: 41 U/L (ref 11–51)

## 2022-03-03 LAB — TROPONIN I (HIGH SENSITIVITY): Troponin I (High Sensitivity): 6 ng/L (ref <18)

## 2022-03-03 MED ORDER — TRAMADOL 50 MG TABLET
ORAL_TABLET | ORAL | 0 refills | 0.00000 days | Status: CP
Start: 2022-03-03 — End: 2022-03-03

## 2022-03-03 MED ORDER — DICLOFENAC 1 % TOPICAL GEL
Freq: Four times a day (QID) | TOPICAL | 0 refills | 13 days | Status: CP
Start: 2022-03-03 — End: 2023-03-03
  Filled 2022-03-17: qty 100, 13d supply, fill #0

## 2022-03-03 MED ORDER — BENZONATATE 100 MG PO CAPS: 100.0000 mg | ORAL_CAPSULE | Freq: Three times a day (TID) | ORAL | 0 refills | Status: AC | PRN

## 2022-03-03 MED ORDER — BENZONATATE 100 MG PO CAPS: 100.0000 mg | ORAL_CAPSULE | Freq: Three times a day (TID) | ORAL | 0 refills | Status: DC | PRN

## 2022-03-03 NOTE — ED Provider Notes (Signed)
[facility] Provider Note   Event Date/Time   First MD Initiated Contact with Patient 03/03/22 1241     (approximate) History  Chest Pain  HPI Donna Crane is a 61 y.o. female with a stated past medical history of hypertension and fibromyalgia who presents for left-sided anterior chest pain that began approximately 2 weeks prior to arrival.  This pain is reproducible with palpation.  Patient states that she feels that she cannot take a full breath without significant pain.  Patient describes it as 8/10, aching, constant pain to the left anterior chest wall that radiates through the back.  Patient also endorses associated nonproductive cough over the last 2 weeks.  Patient also states pleuritic pain that is worse when she coughs or takes a deep breath. ROS: Patient currently denies any vision changes, tinnitus, difficulty speaking, facial droop, sore throat, abdominal pain, nausea/vomiting/diarrhea, dysuria, or weakness/numbness/paresthesias in any extremity   Physical Exam  Triage Vital Signs: ED Triage Vitals  Enc Vitals Group     BP 03/03/22 1138 (!) 159/79     Pulse Rate 03/03/22 1136 74     Resp 03/03/22 1136 18     Temp 03/03/22 1136 98 F (36.7 C)     Temp Source 03/03/22 1136 Oral     SpO2 03/03/22 1136 100 %     Weight --      Height --      Head Circumference --      Peak Flow --      Pain Score 03/03/22 1137 8     Pain Loc --      Pain Edu? --      Excl. in GC? --    Most recent vital signs: Vitals:   03/03/22 1136 03/03/22 1138  BP:  (!) 159/79  Pulse: 74   Resp: 18   Temp: 98 F (36.7 C)   SpO2: 100%    General: Awake, oriented x4. CV:  Good peripheral perfusion.  Resp:  Normal effort.  Abd:  No distention.  Other:  Elderly Hispanic obese female laying in bed in no acute distress.  Tenderness to palpation over the left anterior chest wall ED Results / Procedures / Treatments  Labs (all labs ordered are listed, but only abnormal  results are displayed) Labs Reviewed  BASIC METABOLIC PANEL - Abnormal; Notable for the following components:      Result Value   Glucose, Bld 139 (*)    Calcium 8.2 (*)    All other components within normal limits  CBC - Abnormal; Notable for the following components:   Hemoglobin 10.3 (*)    HCT 33.4 (*)    MCV 79.9 (*)    MCH 24.6 (*)    RDW 15.8 (*)    All other components within normal limits  RESP PANEL BY RT-PCR (RSV, FLU A&B, COVID)  RVPGX2  LIPASE, BLOOD  TROPONIN I (HIGH SENSITIVITY)  TROPONIN I (HIGH SENSITIVITY)   EKG ED ECG REPORT I, Merwyn Katos, the attending physician, personally viewed and interpreted this ECG. Date: 03/03/2022 EKG Time: 1132 Rate: 75 Rhythm: normal sinus rhythm QRS Axis: normal Intervals: normal ST/T Wave abnormalities: normal Narrative Interpretation: no evidence of acute ischemia RADIOLOGY ED MD interpretation: 2 view chest x-ray interpreted by me shows no evidence of acute abnormalities including no pneumonia, pneumothorax, or widened mediastinum -Agree with radiology assessment Official radiology report(s): DG Chest 2 View  Result Date: 03/03/2022 CLINICAL DATA:  Chest pain for 2 weeks.  EXAM: CHEST - 2 VIEW COMPARISON:  Chest two views 06/17/2021, CT chest 06/18/2021 FINDINGS: Postsurgical changes are again seen of plate and screw fixation of the sternum. Heart size is again mildly enlarged. Mediastinal contours are within normal limits. Mild calcification within aortic arch. Mildly decreased lung volumes. The lungs are clear. No pleural effusion or pneumothorax. Mild-to-moderate multilevel degenerative disc changes of the thoracic spine. Minimal anterior height loss of the T7 vertebral body, unchanged and chronic. IMPRESSION: 1. No active cardiopulmonary disease. 2. Mild cardiomegaly. Electronically Signed   By: Neita Garnet M.D.   On: 03/03/2022 12:06   PROCEDURES: Critical Care performed: No .1-3 Lead EKG Interpretation  Performed  by: Merwyn Katos, MD Authorized by: Merwyn Katos, MD     Interpretation: normal     ECG rate:  73   ECG rate assessment: normal     Rhythm: sinus rhythm     Ectopy: none     Conduction: normal    MEDICATIONS ORDERED IN ED: Medications - No data to display IMPRESSION / MDM / ASSESSMENT AND PLAN / ED COURSE  I reviewed the triage vital signs and the nursing notes.                             The patient is on the cardiac monitor to evaluate for evidence of arrhythmia and/or significant heart rate changes. Patient's presentation is most consistent with acute presentation with potential threat to life or bodily function. This patient presents with atypical chest pain, most likely secondary to musculoskeletal injury. Differential diagnosis includes rib fracture, costochondritis, sternal fracture. Low suspicion for ACS, acute PE (PERC negative), pericarditis / myocarditis, thoracic aortic dissection, pneumothorax, pneumonia or other acute infectious process. Presentation not consistent with other acute, emergent causes of chest pain at this time. No indication for cardiac enzyme testing. Plan to order CXR to evaluate for acute cardiopulmonary causes.  Plan: EKG, CXR, pain control  Dispo: Discharge home with home care   FINAL CLINICAL IMPRESSION(S) / ED DIAGNOSES   Final diagnoses:  Chest wall pain  Acute cough   Rx / DC Orders   ED Discharge Orders          Ordered    benzonatate (TESSALON PERLES) 100 MG capsule  3 times daily PRN,   Status:  Discontinued        03/03/22 1354    benzonatate (TESSALON PERLES) 100 MG capsule  3 times daily PRN        03/03/22 1407           Note:  This document was prepared using Dragon voice recognition software and may include unintentional dictation errors.   Merwyn Katos, MD 03/03/22 1440

## 2022-03-03 NOTE — Discharge Instructions (Addendum)
The best over the counter cough medicine for your symptoms would be dextromethorphan (delsym, robitussin). Please use these over the counter medications if the tessalon perles do not control your cough.

## 2022-03-03 NOTE — ED Triage Notes (Signed)
Pt presents to ED with /co of CP that has been ongoing for 2 weeks. Pain is reproducible on palapation. Pt denies N/V/D. Pt denies fevers or chills. Pt does endorse cough.

## 2022-03-03 NOTE — ED Provider Triage Note (Signed)
Emergency Medicine Provider Triage Evaluation Note  Donna Crane, a 61 y.o. female  was evaluated in triage.  Pt complains of anterior chest pain for the last 2 weeks.  Patient reports symptoms seem to refer through to the mid scapular region.  Symptoms are aggravated by palpation over the anterior chest and his Thoracic region.  She denies any associated NVD.  She does report cough aggravates her symptoms.  Review of Systems  Positive: CP Negative: NVD  Physical Exam  BP (!) 159/79   Pulse 74   Temp 98 F (36.7 C) (Oral)   Resp 18   SpO2 100%  Gen:   Awake, no distress  NAD Resp:  Normal effort CTA MSK:   Moves extremities without difficulty  Other:    Medical Decision Making  Medically screening exam initiated at 11:39 AM.  Appropriate orders placed.  Donna Crane was informed that the remainder of the evaluation will be completed by another provider, this initial triage assessment does not replace that evaluation, and the importance of remaining in the ED until their evaluation is complete.  Patient to the ED for evaluation of 2 weeks of intermittent anterior chest pain with referral to the scapulothoracic region.  Patient is a reporting reproducible pain with palpation and cough.   Lissa Hoard, PA-C 03/03/22 1141

## 2022-03-03 NOTE — Unmapped (Signed)
Patient C/O Generalized pain but worse in ankles, feet, knees, toes, elbows, wrists, hands & fingers. C/O chest pain, SOB, anxious. O2 Sat 96% on RA. Daughter reports, chest pain is chronic but worsened since increased dose Pregabalin &/or Oxycodone. Daughter questioning why she had to give [place] this information every time, it is documented in her record. Informed daughter that if patient's chest pain is acute it would be treated as emergent.     Reports, since increased dose Pregabalin &/or Oxycodone she has started getting spots or pimples on her face. Not sure if its an allergic reaction. C/O numbness tips of fingers. Reports she has nightmares, agitated and feels as if she is losing her mind when taking Oxycodone, even when she takes small pieces of tablet. Takes it because it calms her pain.    Medication:oxyCODONE-acetaminophen (PERCOCET) 5-325 mg per tablet   ZOX:WRUE 1 tablet by mouth daily as needed for pain.   Quantity on RX: #30  Filled on:02/10/22  Pill count today: # 14.5    Daughter declined offer of spanish interpreter.

## 2022-03-03 NOTE — Unmapped (Signed)
Patient placed unused Pregabalin 25 mg capsules, #42 in the [employer] drug deactivation system pouch.  Patient witnessed Joanette Gula, RN fill pouch halfway with warm water and wait 30 seconds.Joanette Gula, RN sealed pouch, gently shook contents  then discarded into a designated waste receptacle outside the view of the patient in a non public access area. Witnessed by Harmon Pier, RN.     Patient placed unused Oxycodone-Acetaminophen 5/325 mg tablets, #14.5 in the [employer] drug deactivation system pouch.  Patient witnessed Joanette Gula, RN fill pouch halfway with warm water and wait 30 seconds.Joanette Gula, RN sealed pouch, gently shook contents  then discarded into a designated waste receptacle outside the view of the patient in a non public access area. Witnessed by Harmon Pier, RN.

## 2022-03-03 NOTE — Unmapped (Signed)
Per Dr. Fayrene Fearing pt is concerned with cough, shortness of breath and chest pain, asked me to call pt PCP to see if there was any availability today.    I spoke with Noreene Larsson at [phone] there is no availability today at their office advised going to ED or urgent care, I informed pt.

## 2022-03-03 NOTE — Unmapped (Signed)
STOP oxycodone and lyrica  START tramadol 50 mg take 1/2 tab twice daily as needed  See your primary care doctor immediately for chest pain evaluation

## 2022-03-03 NOTE — Unmapped (Unsigned)
Department of Anesthesiology  [facility]  [address], [address]  [place], [place] [address]  [phone]    Chronic Pain Follow-Up Note    Assessment and Plan  Attending: [name] is a 61 y.o. female with a PMHx significant for DM. [facility].    Per chart review, the patient was referred to our clinic as well as rheumatology by Dermatology reg inflammatory lesions on her hands, muscle pain and weakness. The patient was seen by an outside rheumatologist at [facility] with ddx of PMR vs Fibromyalgia, she was also started on Humira. Patient does have a history of glaucoma and is unable to take steroids per daughter per ophthalmologist.    1. Arthralgia, unspecified joint/ 2. Fibromyalgia  Patient presents with continued diffuse muscle and joint pain with primary focus to forearms. Carries prior dx of  Fibromyalgia. Her pain changes in severity and location every couple of days which is consistent with nature of Fibromyalgia pain. Upon exam, there is diffuse tenderness to palpation and hyperalgesia to light touch to the muscle tissue and tenderness on joint evaluation. Pain impacts her daily activities and limits functionality significantly. We encouraged exercises and movement given diagnosis of Fibromyalgia. She endorses benefit from the Lyrica that we initiated at last visit but does note that it causes some slight brain fog and drowsiness. The patient was initiated on Lyrica 25 mg at night but after increasing it to 2 tabs per night reports having auditory hallucinations and feeleing out of it.Pt also reports excessive sedation and sleepiness with Percocet 5/35 even when using 1/2 tab po. The patient reports not being able to tolerate Lyrica and Percocet and requests medication change thus will try tramadol 25 mg (1/2 tab) bid prn, will consider escalating the dose if patient is able to tolerate it. The patient is accompanied by her daughter who serves as an interpreter, the patient and her daughter were agreeable to this plan. UDS and opioid agreement signed at last visit. The patient was also provided Rx for topical voltaren gel.     Of note the patient presented with c/o chest pain of few days in duration for which she was not evaluated yet. Her daughter reports this being chronic issue thus she and the patient was not concerned about it and did not seek help. Taking under consideration patient's comorbidities ans symptomatology concerning for cardiac origin I have advised them to immediately seek evaluation either in ED or by their PCP that very day, and they both expressed their understanding.       1. Fibromyalgia          General Recommendations: The pain condition that the patient suffers from is best treated with a multidisciplinary approach that involves an increase in physical activity to prevent de-conditioning and worsening of the pain cycle, as well as psychological counseling (formal and/or informal) to address the co-morbid psychological affects of pain.  Treatment will often involve judicious use of pain medications and interventional procedures to decrease the pain, allowing the patient to participate in the physical activity that will ultimately produce long-lasting pain reductions.  The goal of the multidisciplinary approach is to return the patient to a higher level of overall function and to restore their ability to perform activities of daily living.    PLAN:  STOP oxycodone   STOP lyrica  START tramadol 50 mg take 1/2 tab twice daily as needed  HPI:  Anne Barber is seen in consultation at the request of Anne Barber,*  For evaluation and recommendations regarding Her chronic pain.     At initial visit in November, the patient presented with continued diffuse body pain throughout and recent diagnosis of Fibromyalgia. At the time of the last visit the patient was provided with Lyrica and Percocet, however was not able to tolerate it. The patient was initiated on Lyrica 25 mg at night but after increasing it to 2 tabs per night reports having auditory hallucinations and feeleing out of it.Pt also reports excessive sedation and sleepiness with Percocet 5/35 even when using 1/2 tab po. The patient reports not being able to tolerate Lyrica and Percocet and requests medication change.     Of note the patient presented with c/o chest pain of few days in duration for which she was not evaluated yet. Her daughter reports this being chronic issue thus she and the patient was not concerned about it and did not seek help. Taking under consideration patient's comorbidities ans symptomatology concerning for cardiac origin I have advised them to immediately seek evaluation either in ED or by their PCP that very day, and they both expressed their understanding.     Since last visit, the patient has followed with PT.     Pain Clinic? No    The patient states her pain is located everywhere in muscles and joints and the severity of her pain ranges from 10/10 to 10/10.  Her pain currently is 10/10 and on average is 10/10.  She describes the sensation of her pain as aching, burning, dull, nauseating, sharp, shooting, stabbing, tender, throbbing. Her pain is present all of the time and worst all of the time. The patient???s pain impacts her entire life, mood, ability to perform ADLS and interact with family. Her interval history includes participation in PT. Her pain has stayed the same. She also reports substernal chest pain of few days duration with associated SOB.    Previous interventions include Medications  Workmans Compensation is not involved.  This is not involved with a lawsuit or lawyer  The treatment goals include Complete resolution with medications if necessary    Previous Medication Trials: duloxetine/cymbalta, fluoxetine, and ibuprofen    IMAGING:    CT 02/19/18  There are degenerative changes at the atlantoaxial joint. Prominent hypertrophic osteophytosis is present anteriorly at C2-3. Hypertrophic facet changes are particularly prominent at C3-4 through C6-7, left greater than right. There is a area of linear radiolucency most suggestive of a partially healed old fracture of the right lamina of C3   Allergies as of 03/03/2022 - Reviewed 02/11/2022   Allergen Reaction Noted    Other  12/27/2021      Current Outpatient Medications   Medication Sig Dispense Refill    amlodipine (NORVASC) 5 MG tablet Take 1 tablet (5 mg total) by mouth daily. 90 tablet 3    aspirin (ECOTRIN) 81 MG tablet Take 1 tablet (81 mg total) by mouth daily. 90 tablet 3    atorvastatin (LIPITOR) 40 MG tablet Take 1 tablet (40 mg total) by mouth daily. 90 tablet 3    BD INSULIN SYRINGE ULT-FINE II 1 mL 31 gauge x 5/16 Syrg 1 Package by Miscellaneous route Two (2) times a day. 100 each 3    blood sugar diagnostic (ONETOUCH ULTRA BLUE TEST STRIP) Strp USE TO CHECK BLOOD SUGAR UP TO 10 TIMES DAILY AS NEEDED 600 strip 1    blood-glucose meter kit  Use as instructed 1 each 0    cetirizine (ZYRTEC) 10 MG tablet Take 1 tablet (10 mg total) by mouth daily. 90 tablet 1    clindamycin (CLEOCIN T) 1 % lotion Apply topically every morning. 60 mL 5    clindamycin (CLEOCIN T) 1 % Swab Apply 1 application topically Two (2) times a day. 60 each 11    dexamethasone (DECADRON) 0.1 % ophthalmic solution Administer 4 drops into the left ear Two (2) times a day for 30 days 5 mL 0    dextran 70-hypromellose, PF, (TEARS NATURAL FREE) 0.1-0.3 % Dpet       diclofenac sodium (VOLTAREN) 1 % gel Apply 2 g topically four (4) times a day. 100 g 0    dorzolamide-timoloL (COSOPT) 22.3-6.8 mg/mL ophthalmic solution Administer 1 drop to both eyes Two (2) times a day. 10 mL 12    dulaglutide (TRULICITY) 0.75 mg/0.5 mL injection pen Inject 0.5 mL (0.75 mg total) under the skin every seven (7) days. 6 mL 1    DULoxetine (CYMBALTA) 60 MG capsule Take 1 capsule (60 mg total) by mouth daily. 90 capsule 1    empagliflozin (JARDIANCE) 25 mg tablet Take 1 tablet (25 mg total) by mouth daily. 90 tablet 3    empty container Misc Use as directed to dispose of Humira pens. 1 each 2    flash glucose sensor (FREESTYLE LIBRE 2 SENSOR) kit Use to monitor blood glucose levels continuously. Change sensor every 10 days 3 each 11    fluticasone propionate (FLONASE) 50 mcg/actuation nasal spray 1 spray into each nostril two (2) times a day. 48 g 1    folic acid (FOLVITE) 1 MG tablet Take 1 tablet (1 mg total) by mouth daily. 30 tablet 3    folic acid/multivit-min/lutein (CENTRUM SILVER ORAL) Take 1 tablet by mouth daily at 0600.       HUMIRA PEN CITRATE FREE 40 MG/0.4 ML Inject 0.4 mL (40 mg total) under the skin every seven (7) days. 4 each 11    HUMIRA PEN CITRATE FREE STARTER PACK FOR CROHN'S/UC/HS 3 X 80 MG/0.8 ML Inject the contents of 2 pens (160 mg) under the skin on day 1, THEN 1 pen (80 mg) on day 15. 3 each 0    insulin glargine (BASAGLAR, LANTUS) 100 unit/mL (3 mL) injection pen Inject 0.5 mL (50 Units total) under the skin nightly. 45 mL 1    lancing device Misc USE 4 TIMES DAILY BEFORE MEALS AND NIGHTLY ( USAR CUATRO VECES AL DIA SPBM Y TODAS LAS NOCHES ) 1 each 1    losartan (COZAAR) 25 MG tablet Take 1 tablet (25 mg total) by mouth daily. 90 tablet 3    metFORMIN (GLUCOPHAGE) 1000 MG tablet Take 1 tablet (1,000 mg total) by mouth 2 (two) times a day with meals. 180 tablet 3    methotrexate 2.5 MG tablet Take 8 tablets (20 mg total) by mouth once a week. 96 tablet 0    NON FORMULARY Low dose Naltrexone 4.5 mg PO daily 30 each 1    omeprazole (PRILOSEC) 20 MG capsule Take 1 capsule (20 mg total) by mouth daily. 90 capsule 3    oxyCODONE-acetaminophen (PERCOCET) 5-325 mg per tablet Take 1 tablet by mouth daily as needed for pain. 30 tablet 0    pen needle, diabetic (ULTICARE PEN NEEDLE) 32 gauge x 5/32 (4 mm) Ndle Use as directed two (2) times a day 100 each 12    polyethylene glycol (GLYCOLAX)  17 gram/dose powder Take 17 g by mouth daily. 238 g 11    predniSONE (DELTASONE) 10 MG tablet  (Patient not taking: Reported on 12/27/2021)      predniSONE (DELTASONE) 20 MG tablet Take 1 tablet (20 mg total) by mouth daily. (Patient not taking: Reported on 10/01/2021) 30 tablet 0    pregabalin (LYRICA) 25 MG capsule Take 1 capsule in the morning and 2 capsules at night. 90 capsule 0    traMADoL (ULTRAM) 50 mg tablet 1/2 tab twice daily as needed 30 tablet 0    traZODone (DESYREL) 50 MG tablet Take 1 tablet (50 mg total) by mouth nightly. 90 tablet 3     No current facility-administered medications for this visit.     Facility-Administered Medications Ordered in Other Visits   Medication Dose Route Frequency Provider Last Rate Last Admin    matrix hemostatic sealant (FLOSEAL) topical             matrix hemostatic sealant (FLOSEAL) topical                Lab Results   Component Value Date    CREATININE 0.80 12/27/2021       Lab Results   Component Value Date    ALKPHOS 128 (H) 12/27/2021    BILITOT 0.2 (L) 12/27/2021    PROT 7.5 12/27/2021    ALBUMIN 3.6 12/27/2021    ALT 12 12/27/2021    AST 22 12/27/2021       Lab Results   Component Value Date    PLT 272 12/27/2021         Urine toxiciology screen  No results found for: AMPHU, BARBU, BENZU, CANNAU, METHU, OPIAU, COCAU    OPIOID CONFIRMATION:  No results found for: CDIFFTOX, NBUPR, LABCO, HYDROCODONE, HYDROMORPH, MORPHINE, OXYCODONE, OXMU, MAMU, OPIU     BENZODIAZEPINE CONFIRMATION:  No results found for: CDIFFTOX, OHALP, CLONU, HDXYFLRAZUR, 7NHFLU, DESALKYCONF, LORAZURQT, HDXYTRIAZUR, MIDAZURQT, BNZU    Review of Systems:  As per HPI    PHYSICAL EXAM:  BP 157/85  - Pulse 76  - Temp 36.3 ??C (97.3 ??F) (Skin)  - Resp 16  - Ht 152.4 cm (5')  - Wt 82.6 kg (182 lb 3.2 oz)  - SpO2 96%  - BMI 35.58 kg/m??   Wt Readings from Last 3 Encounters:   03/03/22 82.6 kg (182 lb 3.2 oz)   02/04/22 81.6 kg (179 lb 14.4 oz) 02/01/22 81.6 kg (179 lb 14.4 oz)     GENERAL:  The patient is well developed, overweight female in no apparent distress.   HEAD/NECK:    Normocephalic/atraumatic.   CV:  Hypertensive, RR  LUNGS:   Normal work of breathing, no supplemental O2  EXTREMITIES:  No clubbing, cyanosis noted.  NEUROLOGIC:    The patient is alert and oriented, speech fluent (spanish)   MUSCULOSKELETAL:    Motor function  preserved. Diffuse tenderness and hyperalgesia in muscles of extremities as well as throughout the back  GAIT:  The patient rises from a seated position with mild difficulty and ambulates with nonantalgic gait without the assistance of a walking aid.   SKIN:   No obvious rashes, lesions, or erythema.  PSY:   Appropriate affect. Exhibits pain behaviors. No evidence of psychomotor retardation or agitation, no signs of intoxication. affect. No overt pain behaviors. No evidence of psychomotor retardation or agitation, no signs of intoxication.

## 2022-03-03 NOTE — Unmapped (Signed)
Reason for Disposition  ??? Caller has already spoken with another triager and has no further questions.    Answer Assessment - Initial Assessment Questions  N/A    Protocols used: No Contact or Duplicate Contact Call-A-AH

## 2022-03-07 NOTE — Unmapped (Signed)
[facility] Refill Coordination Note    Specialty Medication(s) to be Shipped:   Inflammatory Disorders: Humira    Other medication(s) to be shipped: No additional medications requested for fill at this time     Anne Barber, DOB: [birth year]  Phone: [phone] (home)       All above HIPAA information was verified with patient's family member, daughter .     Was a Nurse, learning disability used for this call? No    Completed refill call assessment today to schedule patient's medication shipment from the [facility] [phone]).  All relevant notes have been reviewed.     Specialty medication(s) and dose(s) confirmed: Regimen is correct and unchanged.   Changes to medications: [name] reports starting the following medications: Tramadol 50mg  .5tab by mouth daily as needed (she is taking twice daily); Benzonatate 100mg  1 cap by mouth three times daily  Changes to insurance: No  New side effects reported not previously addressed with a pharmacist or physician: None reported  Questions for the pharmacist: No    Confirmed patient received a Conservation officer, historic buildings and a Surveyor, mining with first shipment. The patient will receive a drug information handout for each medication shipped and additional FDA Medication Guides as required.       DISEASE/MEDICATION-SPECIFIC INFORMATION        For patients on injectable medications: Patient currently has 3 doses left.  Next injection is scheduled for 12/18, 12/25 and 1/1.    SPECIALTY MEDICATION ADHERENCE     Medication Adherence    Patient reported X missed doses in the last month: 0  Specialty Medication: HUMIRA(CF) PEN 40 mg/0.4 mL injection  Patient is on additional specialty medications: No  Patient is on more than two specialty medications: No                          Were doses missed due to medication being on hold? No     Humira CF 40 mg/0.4 ml : 21 days on hand      REFERRAL TO PHARMACIST     Referral to the pharmacist: Not needed      [name]     Shipping address confirmed in Epic.     Delivery Scheduled: Yes, Expected medication delivery date: 12/20.     Medication will be delivered via Same Day Courier to the prescription address in Epic WAM.    Joslyn Devon   [facility] Specialty Technician

## 2022-03-07 NOTE — Unmapped (Signed)
Using Cover My Meds submitted an urgent PA for tramadol. [name] (Key: [id number])    Your information has been sent to [employer].

## 2022-03-08 MED FILL — DULOXETINE 60 MG CAPSULE,DELAYED RELEASE: ORAL | 90 days supply | Qty: 90 | Fill #1

## 2022-03-08 NOTE — Unmapped (Signed)
Duplicate encounter

## 2022-03-08 NOTE — Unmapped (Signed)
Tramadol approved:   Rec'd [name] Key: [id number] - PA Case ID: [id number]  Need help? Call us at [phone]  Outcome   Approvedon December 11  Request Reference Number: [id number]. TRAMADOL HCL TAB 50MG  is approved through 09/06/2022. For further questions, call [employer] at [phone].  Drug    traMADol HCl 50MG  tablets   Form    [employer] Electronic Prior Authorization Form (2017 NCPDP)     Will notify pt's pharmacy

## 2022-03-12 MED ORDER — OXYCODONE-ACETAMINOPHEN 5 MG-325 MG TABLET
ORAL_TABLET | Freq: Every day | ORAL | 0 refills | 30 days | PRN
Start: 2022-03-12 — End: ?

## 2022-03-16 MED FILL — HUMIRA PEN CITRATE FREE 40 MG/0.4 ML: SUBCUTANEOUS | 28 days supply | Qty: 4 | Fill #10

## 2022-04-01 DIAGNOSIS — K219 Gastro-esophageal reflux disease without esophagitis: Principal | ICD-10-CM

## 2022-04-01 MED ORDER — OMEPRAZOLE 20 MG CAPSULE,DELAYED RELEASE
ORAL_CAPSULE | Freq: Every day | ORAL | 3 refills | 90 days | Status: CP
Start: 2022-04-01 — End: 2023-04-01
  Filled 2022-04-07: qty 90, 90d supply, fill #0

## 2022-04-01 NOTE — Unmapped (Signed)
[facility] Refill Coordination Note    Specialty Medication(s) to be Shipped:   Inflammatory Disorders: Humira    Other medication(s) to be shipped:   Omeprazole  ,   Pen  needles ,  Glycolax, Methotrexate  ,  Floic  acid,  and   Cetirizine        Anne Barber, DOB: [birth year]  Phone: [phone] (home)       All above HIPAA information was verified with patient's family member, daughter .     Was a Nurse, learning disability used for this call? No    Completed refill call assessment today to schedule patient's medication shipment from the [facility] [phone]).  All relevant notes have been reviewed.     Specialty medication(s) and dose(s) confirmed: Regimen is correct and unchanged.   Changes to medications: [name] reports no changes at this time.  Changes to insurance: No  New side effects reported not previously addressed with a pharmacist or physician: None reported  Questions for the pharmacist: No    Confirmed patient received a Conservation officer, historic buildings and a Surveyor, mining with first shipment. The patient will receive a drug information handout for each medication shipped and additional FDA Medication Guides as required.       DISEASE/MEDICATION-SPECIFIC INFORMATION        For patients on injectable medications: Patient currently has 1 doses left.  Next injection is scheduled for 04/04/22.    SPECIALTY MEDICATION ADHERENCE     Medication Adherence    Patient reported X missed doses in the last month: 0  Specialty Medication: HUMIRA(CF) PEN 40 mg/0.4 mL  Patient is on additional specialty medications: No  Patient is on more than two specialty medications: No  Any gaps in refill history greater than 2 weeks in the last 3 months: no  Demonstrates understanding of importance of adherence: yes                                Were doses missed due to medication being on hold? No     Humira CF 40 mg/0.4 ml : 7 days on hand      REFERRAL TO PHARMACIST     Referral to the pharmacist: Not needed      [facility]     Shipping address confirmed in Epic.     Delivery Scheduled: Yes, Expected medication delivery date: 04/07/22 .     Medication will be delivered via Same Day Courier to the prescription address in Epic WAM.    Anne Barber   [facility] Specialty Technician

## 2022-04-07 MED FILL — ULTICARE PEN NEEDLE 32 GAUGE X 5/32" (4 MM): SUBCUTANEOUS | 50 days supply | Qty: 100 | Fill #4

## 2022-04-07 MED FILL — POLYETHYLENE GLYCOL 3350 17 GRAM/DOSE ORAL POWDER: ORAL | 14 days supply | Qty: 238 | Fill #2

## 2022-04-07 MED FILL — METHOTREXATE SODIUM 2.5 MG TABLET: ORAL | 21 days supply | Qty: 28 | Fill #2

## 2022-04-07 MED FILL — CETIRIZINE 10 MG TABLET: ORAL | 30 days supply | Qty: 30 | Fill #2

## 2022-04-07 MED FILL — FOLIC ACID 1 MG TABLET: ORAL | 90 days supply | Qty: 90 | Fill #1

## 2022-04-07 MED FILL — HUMIRA PEN CITRATE FREE 40 MG/0.4 ML: SUBCUTANEOUS | 28 days supply | Qty: 4 | Fill #11

## 2022-04-11 MED ORDER — OXYCODONE-ACETAMINOPHEN 5 MG-325 MG TABLET
ORAL_TABLET | Freq: Every day | ORAL | 0 refills | 30 days | PRN
Start: 2022-04-11 — End: ?

## 2022-04-12 NOTE — Unmapped (Addendum)
Dermatology Note     Assessment and Plan:      Hidradenitis suppurativa, [name] I or II complicated by seronegative spondyloarthropathy:   - Previously improved after addition of Humira after failing doxycyline (lack of efficacy), spironolactone (side effect of elevated blood sugars) and clindamycin swabs.   - HS currently very fairly well-controlled with a few active lesions on exam today. The patient would like to explore attaining Remicade given continued disease activity and pain with humira injections.   - Continues to see Rheumatology for Fibromyalgia and seronegative spondyloarthropathy   - Continue Humira 40 mg weekly (refilled today), methotrexate (as prescribed by rheumatology). Will discuss switching to remicade with rheumatology.   - Start clobetasol ointment twice a day to persistent lesion on left inguinal crease     High risk medication use (humira)  - Quant gold & hepatitis labs up to date (06/09/21)    The patient was advised to call for an appointment should any new, changing, or symptomatic lesions develop.     RTC: Return in about 3 months (around 07/13/2022). or sooner as needed   _________________________________________________________________      Chief Complaint     Chief Complaint   Patient presents with    HS     Same       HPI     Anne Barber is a 62 y.o. female who presents as a returning patient (last seen 09/15/2021) to Dermatology for follow up of HS . Offered spanish interpreter but patient requested that daughter interpret. States that she continues to have lesions in groin that are persistent. Continues to take humira but has increased pain with injections and pain in vagina after injections. She endorses some SOB and states that she has previously seen the ED (explained to patient and daughter that if she has SOB and chest pains she should go the ED for workup; they deferred). Continues on methotrexate by rheumatology.     The patient denies any other new or changing lesions or areas of concern.     Pertinent Past Medical History     HS  Seronegative arthritis   Fibromyalgia    Past Medical History, Family History, Social History, Medication List, Allergies, and Problem List were reviewed in the rooming section of Epic.     ROS: Other than symptoms mentioned in the HPI, no fevers, chills, or other skin complaints    Physical Examination     GENERAL: Well-appearing female in no acute distress, resting comfortably.  NEURO: Alert and oriented, answers questions appropriately  PSYCH: Normal mood and affect  RESP: No increased work of breathing  SKIN: Examination of the bilateral lower extremities and groin was performed  - Erythematous inflamed nodule on left inguinal crease    All areas not commented on are within normal limits or unremarkable  - female chaperone present      (Approved Template 12/09/2019)

## 2022-04-13 ENCOUNTER — Ambulatory Visit
Admit: 2022-04-13 | Discharge: 2022-04-14 | Payer: PRIVATE HEALTH INSURANCE | Attending: Student in an Organized Health Care Education/Training Program | Primary: Student in an Organized Health Care Education/Training Program

## 2022-04-13 DIAGNOSIS — L732 Hidradenitis suppurativa: Principal | ICD-10-CM

## 2022-04-13 MED ORDER — CLOBETASOL 0.05 % TOPICAL OINTMENT
Freq: Two times a day (BID) | TOPICAL | 1 refills | 0.00000 days | Status: CP
Start: 2022-04-13 — End: 2023-04-13
  Filled 2022-04-21: qty 30, 14d supply, fill #0

## 2022-04-18 ENCOUNTER — Ambulatory Visit
Admit: 2022-04-18 | Payer: PRIVATE HEALTH INSURANCE | Attending: Rehabilitative and Restorative Service Providers" | Primary: Rehabilitative and Restorative Service Providers"

## 2022-04-18 ENCOUNTER — Ambulatory Visit
Admit: 2022-04-18 | Discharge: 2022-04-19 | Payer: PRIVATE HEALTH INSURANCE | Attending: Student in an Organized Health Care Education/Training Program | Primary: Student in an Organized Health Care Education/Training Program

## 2022-04-18 DIAGNOSIS — M545 Chronic bilateral low back pain without sciatica: Principal | ICD-10-CM

## 2022-04-18 DIAGNOSIS — G8929 Other chronic pain: Principal | ICD-10-CM

## 2022-04-18 DIAGNOSIS — R262 Difficulty in walking, not elsewhere classified: Principal | ICD-10-CM

## 2022-04-18 DIAGNOSIS — M797 Fibromyalgia: Principal | ICD-10-CM

## 2022-04-18 MED ORDER — DORZOLAMIDE 22.3 MG-TIMOLOL 6.8 MG/ML EYE DROPS
Freq: Two times a day (BID) | OPHTHALMIC | 12 refills | 100 days | Status: CP
Start: 2022-04-18 — End: ?
  Filled 2022-04-21: qty 10, 30d supply, fill #0

## 2022-04-18 NOTE — Unmapped (Signed)
[facility] PT AQUATIC THERAPY [facility] [place]  OUTPATIENT PHYSICAL THERAPY  04/18/2022  Note Type: Treatment Note       Patient Name: Anne Barber  Date of Birth:[birth year]  Diagnosis:   Encounter Diagnoses   Name Primary?    Chronic bilateral low back pain without sciatica Yes    Difficulty walking     Fibromyalgia      Referring Provider: Pcp, None Per Patient    Date of Onset of Impairment: 05/14/2021  Date PT Care Plan Established or Reviewed: 02/11/2022  Date PT Treatment Started: 02/11/2022     Plan of Care Effective Date:   Authorization Period: 03/28/2022 - 03/28/2023   Referral Status: Authorized   Requested visits: 27     Session Number:  2    ASSESSMENT & PLAN   Assessment  Assessment details:    Today:  Anne Barber presents for aquatic physical therapy for chronic multifocal pain in the presence of fibromyalgia. The aquatic environment allowed the patient to produce increased multi joint coordination with decreased compensatory strategies during 4-way ambulation. The buoyancy properties of water enabled the patient to complete squats,standing leg raises, and step-ups. She was antalgic requiring UE A and SBA with 4 step-ups due to knee pain. Session was limited slightly by pt's fear of water requiring the majority of exercises at barre with UE A, though this improved throughout session. Aquatic exercise intensity and duration adjusted to appropriately challenge and progress functional strength deficits. The patient will benefit from skilled Physical Therapy intervention to address the moderate impairments listed below and to assist the patient in maximizing her functional independence and safe return to prior level of function. POC to start in mid January due to family work schedule with transportation limitations.       PT evaluation:   02/11/22: Anne Barber is a pleasant 61 y.o. female who presents for Physical Therapy with chronic low back pain and multi joint pain including: neck, hip, knee, ankle, elbow and hand. Primary impairments include decreased functional endurance and mobility.  30s chair stand test demonstrates decreased functional leg strength. Patient reported outcome measure, ODI, demonstrated significant disability.     Pt continues to see Rheumatology for Fibromyalgia and seronegative spondyloarthropathy     03/03/22: ED: This patient presents with atypical chest pain, most likely secondary to musculoskeletal injury. Differential diagnosis includes rib fracture, costochondritis, sternal fracture. Low suspicion for ACS, acute PE (PERC negative), pericarditis / myocarditis, thoracic aortic dissection, pneumothorax, pneumonia or other acute infectious process. Presentation not consistent with other acute, emergent causes of chest pain at this time. No indication for cardiac enzyme testing. Plan to order CXR to evaluate for acute cardiopulmonary causes.      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                 Impairments: decreased endurance, pain, decreased strength, decreased range of motion, impaired motor control, gait deviation, fall risk, impaired balance, impaired coordination, impaired flexibility, impaired ADLs, decreased mobility, core weakness and obesity    Other impairment: low vision, hearing impairment    Personal Factors/Comorbidities: 3+    Specific Comorbidities: HTN, DM2, chronic pain, fibromyalgia, depression with aniexty, impaired vision, multijoint pain    Examination of Body Systems: musculoskeletal, activity/participation and communication    Clinical Presentation: evolving    Clinical Decision Making: moderate    Prognosis: good prognosis    Positive  Prognosis Rationale: caregiver/family support.  Negative Prognosis Rationale: medical status/condition, chronicity of condition and severity of symptoms.      Therapy Goals      Goals:        1. In 12 weeks the patient will demonstrate independent performance of HEP to maintain functional gains.   2. In 12 weeks the patient will complete an additional 3 STS transfers during the 30s chair stand test to met the MDC and to demonstrate increased functional leg strength.  3. In 12 weeks the patient will score a 10 point change on the ODI to demonstrate the MDC (0-100%; lower score indicates a lesser level of disability) and to indicate improved activity tolerance.         Plan    Therapy options: will be seen for skilled physical therapy services    Planned therapy interventions: Aquatic Therapy, Location manager, Education - Patient, Endurance Activites, Functional Mobility, Gait Training, Home Exercise Program, Manual Therapy, Neuromuscular Re-education, Therapeutic Activities, Therapeutic Exercises, Education - Family/Caregiver, Dry Needling, E-Stim, TENS, Self-Care/Home Training, Civil engineer, contracting and Diaphragmatic/Pursed-lip Breathing    DME Equipment: [employer].    Frequency: biweekly.    Duration in weeks: 12    Education provided to: patient and family.    Education provided: HEP, Treatment options and plan, Symptom management, Safety education, Importance of Therapy, Anatomy, Body mechanics, Role of therapy in Rehabilitation, Posture, Community resources and Body awareness    Education results: verbalized good understanding, demonstrates understanding and needs reinforcement.    Communication/Consultation: N/A.              SUBJECTIVE   Interpreter Use: [employer] Interpreter    History of Present Condition      History of Present Condition/Chief Complaint:       Multi joint pain, Fibromyalgia [M79.7]  - Primary  Reason for Exam  Comments: Water therapy        Subjective:     Pt's daughter acting as interpretor: Pt reports she is having B wrist pain, which she uses a cream for. Daughter reports The pain can be bad one day and better the next   Pt also noted toe pain bilaterally.   No longer having pain with taking a deep breath, but notes feeling air coming out simultaneous with L side chest pain, demonstrates a small gasp/inhale.    Pain  Current pain rating: 8  At best pain rating: 7  At worst pain rating: 10  Location: B wrists, toes, left sided lumbar spine, right knee, right ankle  Quality: aching  Relieving factors: medications  Aggravating factors: on the move and walking  Pain Related Behaviors: crying  Progression: no change  Red flags: none.    Precautions and Equipment  Precautions: None  Current Braces/Orthoses: None  Equipment Currently Used: Rolling walker  Prior Functional Status:     Functional Limitation(s)-No physical limitations  Current functional status: use of assistive device, unsteady gait, limited sitting tolerance, limited household activities, limited bending, difficulty with transfers, limited exercise, limited lifting, limited standing tolerance and limited walking tolerance  Social Support    Communication Preference: verbal and written  Barriers to Learning: hearing    Diagnostic Tests    Diagnostic Test Comments:       See EMR    Treatments  Previous treatment: home health PT      Patient Goals  Patient goals for therapy: decreased pain, improved ambulation and improved balance        OBJECTIVE  General Observations  Pleasant female, NAD. Ambulates with rolling walker    Posture  Sitting: slouched  Standing: reduced lumbar lordosis and pain posturing       Range of Motion - Lumbar Spine     No ROM Loss  (0%) Min ROM Loss  (1-25%) Mod ROM Loss  (26-65%) Major ROM Loss  (66-100%)   Symptoms    Flexion   x  Increased pain   Extension    x Increased pain   Side Bend Right   x  Increased pain   Side Bend Left   x  Increased pain   Sideglide Right   x  Increased pain   Sideglide Left   x  Increased pain   Observable movement coordination impairments of the lumbopelvic region with flexion and extension movements or while performing daily physical activities:  YES         Neurological  Strength, tested in sitting Right Left   Hip Flexion (L2) 4/5 4/5   Hip Abduction 4/5 4/5   Hip Adduction 4/5 4/5   Knee Extension (L3) 3+/5 3+/5   Knee Flexion 4/5 4/5   Ankle DF (L4) 4/5 4/5   Great toe extension 5/5 5/5   Ankle PF (S1) 5/5 5/5       Denies Sensory Changes      Palpation  TTP: B lumbar paraspinals     Transfers  Sit to stand: mod independent with increased time     Gait  Very antalgic gait. Needs UE assistance on RW. Decreased speed. Shuffle pattern.     Written Outcome Measures  Oswestry Score: 37 / 50 or 74 %   A score of 0-20% indicates minimal disability. 21-40% indicates moderate disability. 41-60% indicates severe disability. 61-80% indicates crippling disability. 81-100% indicates the patient may be bed bound and careful evaluation is recommended. The Minimal Detectable Change (MDC) is 10 points.     Functional Outcome Measures  30s STS: 2 reps with a lot of pain    TUG: 45 s     Aquatic Physical Therapy Attestation:   Naviana Pandit will benefit from aquatic therapy given clinical presentation of: pain limiting functional movement or exercise tolerance, strength limiting functional movement or exercise, limited tolerance to weightbearing, decreased coordination, motor control deficits, static and/or dynamic balance deficits, and gait abnormalities    During initial Physical Therapy Evaluation the patient was screened and identified the following precautions:   HTN (controlled) - patient and/or therapist will monitor BP  DM2 (controlled) - patient will monitor BG and bring snacks    The following conditions require medical provider clearance prior to aquatic therapy:   No conditions identified     The following conditions were not reported or observed: nausea/vomiting, fever greater than 100 deg, diarrhea/bowel incontinence in the last two weeks, viral/bacterial/fungal disease. The patient verbalizes understanding that the session will be cancelled if any of the above conditions develop. YES    Aquatic Therapy Education:  - Discussed POC: bridge to land therapy  - Advised patient to wear shoes and bring assistive device if applicable while ambulating on pool deck   - Advised patient to bring water and snacks as appropriate   - Advised patient only members of the [facility] are permitted to use amenities such as the hot tub and sauna  - Land re-evaluation scheduled within 30 days of initial pool session by evaluating therapist: yes  - Anticipated method of entry:  Requires use of lift for  enter/exit (400 lb weight limit)  - Focus for aquatic therapist: Bridge to land therapy, Pain reduction, Improve weight bearing tolerance, LE Strengthening, Gait Training, and Core Strengthening   * nervous about water, but willing to try with therapist close by    TREATMENT RENDERED         Total Treatment Time: 45 Minutes    Aquatic Therapy: 45 Minutes  Performed with direct PT instruction, demonstration, supervision, and guidance  - Reassessment of symptoms     Ambulation: in 3 ft depth water x 3-4 laps each  - forward  - sidestepping  - backward     Standing LE: in 3 ft depth water x 10 reps bilaterally  - hip abd  - hip flex  - hip ext  - mini squats  - step ups on 4 inches (Fw and lateral B)  - march in place at barre  - STS from bench on 4 step    Seated LE on pool bench 2x1 min ea bilaterally  - bicycle   - ff/ext scissors  - abd/add scissors            Therapeutic Interventions Charges  $$ Aquatic Therapy [mins]: 45                 I attest that I have reviewed the above information.  Signed: Cassell Smiles, PT, DPT  04/18/2022 4:44 PM        I reviewed the no-show/attendance policy with the patient and caregiver(s). The patient is aware that they must call to cancel appointments more than 24 hours in advance. They are also aware that if they late cancel or no-show three times, we reserve the right to cancel their remaining appointments. This policy is in place to allow [place] to best serve the needs of our caseload.    If patient returns to clinic with variance in plan of care, then it may be attributable to one or more of the following factors: preferred clinician availability, appointment time request availability, therapy pool appointment availability, major holiday with clinic closure, caregiver availability, patient transportation, conflicting medical appointment, inclement weather, and/or patient illness.    If patient does not return for follow up visit(s) related to this episode of care, this note will serve as their discharge note from Physical Therapy.

## 2022-04-19 NOTE — Unmapped (Signed)
I have made myself available to the resident to discuss the patient's findings, assessment, and plan.       Sedalia Muta, MD  [facility] Department of Ophthalmology
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
## 2022-04-22 ENCOUNTER — Ambulatory Visit: Admit: 2022-04-22 | Discharge: 2022-04-22 | Payer: PRIVATE HEALTH INSURANCE

## 2022-04-22 DIAGNOSIS — M797 Fibromyalgia: Principal | ICD-10-CM

## 2022-04-22 DIAGNOSIS — M47819 Spondylosis without myelopathy or radiculopathy, site unspecified: Principal | ICD-10-CM

## 2022-04-22 LAB — CBC W/ AUTO DIFF
BASOPHILS ABSOLUTE COUNT: 0 10*9/L (ref 0.0–0.1)
BASOPHILS RELATIVE PERCENT: 0.4 %
EOSINOPHILS ABSOLUTE COUNT: 0.1 10*9/L (ref 0.0–0.5)
EOSINOPHILS RELATIVE PERCENT: 2.3 %
HEMATOCRIT: 34.8 % (ref 34.0–44.0)
HEMOGLOBIN: 11.1 g/dL — ABNORMAL LOW (ref 11.3–14.9)
LYMPHOCYTES ABSOLUTE COUNT: 1.1 10*9/L (ref 1.1–3.6)
LYMPHOCYTES RELATIVE PERCENT: 21.5 %
MEAN CORPUSCULAR HEMOGLOBIN CONC: 31.9 g/dL — ABNORMAL LOW (ref 32.0–36.0)
MEAN CORPUSCULAR HEMOGLOBIN: 24.8 pg — ABNORMAL LOW (ref 25.9–32.4)
MEAN CORPUSCULAR VOLUME: 77.8 fL (ref 77.6–95.7)
MEAN PLATELET VOLUME: 8.9 fL (ref 6.8–10.7)
MONOCYTES ABSOLUTE COUNT: 0.4 10*9/L (ref 0.3–0.8)
MONOCYTES RELATIVE PERCENT: 6.9 %
NEUTROPHILS ABSOLUTE COUNT: 3.5 10*9/L (ref 1.8–7.8)
NEUTROPHILS RELATIVE PERCENT: 68.9 %
PLATELET COUNT: 237 10*9/L (ref 150–450)
RED BLOOD CELL COUNT: 4.48 10*12/L (ref 3.95–5.13)
RED CELL DISTRIBUTION WIDTH: 17 % — ABNORMAL HIGH (ref 12.2–15.2)
WBC ADJUSTED: 5.1 10*9/L (ref 3.6–11.2)

## 2022-04-22 LAB — CREATININE
CREATININE: 0.63 mg/dL
EGFR CKD-EPI (2021) FEMALE: >90 mL/min/{1.73_m2} (ref >=60)

## 2022-04-22 LAB — AST: AST (SGOT): 21 U/L (ref <=34)

## 2022-04-22 LAB — ALT: ALT (SGPT): 9 U/L — ABNORMAL LOW (ref 10–49)

## 2022-04-22 LAB — BUN: BLOOD UREA NITROGEN: 10 mg/dL (ref 9–23)

## 2022-04-22 LAB — ALBUMIN: ALBUMIN: 3.3 g/dL — ABNORMAL LOW (ref 3.4–5.0)

## 2022-04-22 MED ORDER — DICLOFENAC 1 % TOPICAL GEL
Freq: Four times a day (QID) | TOPICAL | 0 refills | 13 days
Start: 2022-04-22 — End: 2023-04-22

## 2022-04-22 NOTE — Unmapped (Signed)
Rheumatology Clinic Follow Up Visit Note     Assessment and Plan: Anne Barber is a 62 y.o.  female with a PMH of obesity, HTN, HLD, DM with retinopathy, hidradenitis suppurativa on adalimumab (since 04/2020), depression, and anxiety who arrives follow up of SpA. In brief, she began developing diffuse myalgias and arthralgias in 05/2021, after missing 2 doses of adalimumab for her HS. Referred to [facility] rheum and [place] eval of left wrist showed significant synovitis of L wrist with +CPD and tenosynovitis of the common extensor tendon. 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 Adalimumab antibodies ordered (however, not collected) and MTX added 12/2021. Since her last visit, she appears improved with respect to joint pain, however still has persistent synovitis on exam (with difficulty making a fist) and pain in the L wrist with ROM. She also reports abdominal pain / injection site reaction with adalimumab and is wondering about switching to an infusion such as infliximab so she no longer has to inject herself. She follows with derm for HS and was noted to have a persistent lesion on her groin for which they feel additional control could be achieved with switch to infliximab. We discussed side effects of infliximab therapy, including immunosuppression, dosing regimen, need for monitoring of LFTs, and risk of infusion reaction. Pt understands and is amenable.    Seronegative spondyloarthritis  Hidradenitis suppurativa  - Methotrexate monitoring labs today  - Stop adalimumab, start infliximab 5 mg q8 weeks   - Continue methotrexate 20 mg weekly with folic acid  - XR of hands, feet and SI joints to evaluate for erosive disease and sacroiliitis   - Continue follow up with dermatology for HS    Fibromyalgia:  - Continue follow up with pain management    Return in about 4 months (around 08/21/2022).     Patient understands and agrees to plan above.    Patient was seen and discussed with Dr. Scarlette Calico    I personally spent 42 minutes face-to-face and non-face-to-face in the care of this patient, which includes all pre, intra, and post visit time on the date of service.  All documented time was specific to the E/M visit and does not include any procedures that may have been performed.    Eliezer Lofts DO MPH  Rheumatology Fellow, PGY5    HPI: Anne Barber is a 62 y.o.  female with a PMH of obesity, HTN, HLD, DM with retinopathy, hidradenitis suppurativa on adalimumab, depression, and anxiety who arrives for follow up of inflammatory arthritis. She was last seen in 12/2021, at which time [place] evaluation revealed synovitis of her L wrist. Therefore, methotrexate was added to adalimumab.    Since her last visit, she has been feeling slightly better. She reports pain in the right knee, L wrist, hands (MCP joints), b/l ankles, and neck. She reports variable amount of morning stiffness, ranging 15 minutes to 3-4 hours. Her stiffness improves with activity and usually improves by 1 pm after she takes her medicines. She reports good adherence with methotrexate 20 mg weekly and adalimumab weekly. She is interested in switching to an  infusion so she does not have to inject herself anymore. She reports injection site soreness in her abdomen, as well as pain in her pelvis the same day as her injection. She also reports difficulty traveling with adalimumab, as well as issues sometimes getting it from the pharmacy on time.    She reports ongoing blurry vision. She sees opthalmology for her glaucoma. She denies eye  pain or redness. She denies pain in the hips or buttocks. She denies pain behind the heel or under her feet. She has a persistent groin lesion from her HS, but declined intralesional kenalog injection when dermatology offered it to her at the last visit.    ROS:  Otherwise no reported fevers, chills, oral/nasal ulcers, chest pain, SOB, cough.    Labs:  - 05/2021: -ANA, -dsDNA  - 07/2021: +ANA >1:2560, -ENA, -dsDNA, normal compliments, -RF, -CCP, ESR 45, CRP 15    No family hx related to rheumatology noted    Allergies: Other  Immunizations:   Immunization History   Administered Date(s) Administered    COVID-19 VAC,BIVALENT(52YR UP),PFIZER 05/08/2021    COVID-19 VAC,BIVALENT(5-55YR),PFIZER 05/08/2021    COVID-19 VAC,MRNA,TRIS(12Y UP)(PFIZER)(GRAY CAP) 05/08/2020    COVID-19 VACC,MRNA,(PFIZER)(PF) 07/01/2019, 07/22/2019, 05/08/2020    INFLUENZA INJ MDCK PF, QUAD,(FLUCELVAX)(36MO AND UP EGG FREE) 01/20/2020, 01/29/2021    Influenza Vaccine Quad(IM)6 MO-Adult(PF) 05/23/2017, 11/29/2017, 02/01/2022    PNEUMOCOCCAL POLYSACCHARIDE 23-VALENT 06/25/2014, 10/13/2021    SHINGRIX-ZOSTER VACCINE (HZV),RECOMBINANT,ADJUVANTED(IM) 06/28/2020, 09/06/2020    TdaP 06/25/2014      PMHx:   Past Medical History:   Diagnosis Date    Anisocoria     Cataract     Nuclear and Mild Cortical Cataracts, OU    Depression     Diabetes mellitus (CMS-HCC) Dx 1996    Type II    Diabetic retinopathy (CMS-HCC)     PDR OU and mild DME avastin injections x6 right eye  and left eye x1,PRP 2015    Dry eyes     Fractures     Glaucoma     History of Neovascular Glaucoma OD, indeterminate stage    Hypertension     PONV (postoperative nausea and vomiting)     Shoulder injury     Tuberculosis 1978    completed one year of treatment.      PSHx:   Past Surgical History:   Procedure Laterality Date    ANKLE FRACTURE SURGERY      CATARACT EXTRACTION EXTRACAPSULAR W/ INTRAOCULAR LENS IMPLANTATION Left 10/10/2019    HAND SURGERY      PR AQUEOUS SHUNT EXTRAOC EQUAT PLATE RSVR W/GRAFT Right 10/12/2013    Procedure: AQUEOUS SHUNT-EXTRAOCULAR RESERVOIR;  Surgeon: Leda Roys, MD;  Location: MAIN OR [facility];  Service: Ophthalmology    PR COLONOSCOPY W/BIOPSY SINGLE/MULTIPLE Left 11/16/2012    Procedure: COLONOSCOPY, FLEXIBLE, PROXIMAL TO SPLENIC FLEXURE; WITH BIOPSY, SINGLE OR MULTIPLE;  Surgeon: Lauretta Grill, MD;  Location: [facility] GI PROCEDURES [facility];  Service: Gastroenterology    PR DRAIN ANT [facility] BLOOD Right 10/12/2013    Procedure: PARACENTESIS (SEPART PROC); W/REMOV BLD;  Surgeon: Leda Roys, MD;  Location: MAIN OR [facility];  Service: Ophthalmology    PR EYE SURG POST SGMT PROC UNLISTED Left 08/10/2016    PPV/EL/AIR left eye for [facility]    PR OPEN RX STERNUM FRACTURE N/A 03/01/2018    Procedure: OPEN TX STERNUM FX W/WO SKELETAL FIXA;  Surgeon: Cherie Dark, MD;  Location: MAIN OR [facility];  Service: Thoracic    PR REINFORCE SCLERA EYE W GRAFT Right 10/12/2013    Procedure: SCLERAL REINFORCEMENT (SEPART PROC); W/GFT;  Surgeon: Leda Roys, MD;  Location: [facility] OR [facility];  Service: Ophthalmology    PR UPPER GI ENDOSCOPY,BIOPSY N/A 11/16/2012    Procedure: UGI ENDOSCOPY; WITH BIOPSY, SINGLE OR MULTIPLE;  Surgeon: Lauretta Grill, MD;  Location: [facility] PROCEDURES [facility];  Service: Gastroenterology  PR UPPER GI ENDOSCOPY,BIOPSY N/A 05/08/2013    Procedure: UGI ENDOSCOPY; WITH BIOPSY, SINGLE OR MULTIPLE;  Surgeon: Clint Bolder, MD;  [facility];  Service: Gastroenterology    PR VITRECTOMY,PANRETINAL LASER RX Left 09/24/2014    Procedure: VITRECTOMY, MECHANICAL, PARS PLANA APPROACH; WITH ENDOLASER PANRETINAL PHOTOCOAGULATION;  Surgeon: Rockwell Alexandria, MD;  Location: [facility] OR [facility];  Service: Ophthalmology    PR VITRECTOMY,PANRETINAL LASER RX Left 08/10/2016    Procedure: VITRECTOMY, MECHANICAL, PARS PLANA APPROACH; WITH ENDOLASER PANRETINAL PHOTOCOAGULATION;  Surgeon: Nicholaus Corolla, MD;  Location: [facility] OR [facility];  Service: Ophthalmology    PR Hessie Knows LASER RX Right 06/26/2019    Procedure: VITRECTOMY, MECHANICAL, PARS PLANA APPROACH; WITH ENDOLASER PANRETINAL PHOTOCOAGULATION;  Surgeon: Nicholaus Corolla, MD;  Location: [facility] OR [facility];  Service: Ophthalmology    PR XCAPSL CTRC RMVL INSJ IO LENS PROSTH W/O ECP Right 09/26/2019    Procedure: EXTRACAPSULAR CATARACT REMOVAL W/INSERTION OF INTRAOCULAR LENS PROSTHESIS, MANUAL OR MECHANICAL TECHNIQUE WITHOUT ENDOSCOPIC CYCLOPHOTOCOAGULATION; Surgeon: Arville Care, MD;  [facility]: [facility] OR [facility];  Service: Ophthalmology    PR XCAPSL CTRC RMVL INSJ IO LENS PROSTH W/O ECP Left 10/10/2019    Procedure: EXTRACAPSULAR CATARACT REMOVAL W/INSERTION OF INTRAOCULAR LENS PROSTHESIS, MANUAL OR MECHANICAL TECHNIQUE WITHOUT ENDOSCOPIC CYCLOPHOTOCOAGULATION;  Surgeon: Arville Care, MD;  Location: [facility] OR [facility];  Service: Ophthalmology    SHOULDER SURGERY  2016    right    TUBAL LIGATION      YAG CAPSULOTOMY - OU - BOTH EYES Bilateral 11/08/2019     Family Hx:   Family History   Problem Relation Age of Onset    No Known Problems Mother     No Known Problems Father     Diabetes Sister     No Known Problems Daughter     No Known Problems Daughter     Diabetes Brother     Diabetes Grandchild         Type 2    No Known Problems Maternal Aunt     No Known Problems Maternal Uncle     No Known Problems Paternal Aunt     No Known Problems Paternal Uncle     No Known Problems Maternal Grandmother     No Known Problems Maternal Grandfather     No Known Problems Paternal Grandmother     Psoriasis Paternal Grandfather     No Known Problems Other     Clotting disorder Neg Hx     Anesthesia problems Neg Hx     Thyroid disease Neg Hx     Amblyopia Neg Hx     Blindness Neg Hx     Cancer Neg Hx     Cataracts Neg Hx     Glaucoma Neg Hx     Hypertension Neg Hx     Macular degeneration Neg Hx     Retinal detachment Neg Hx     Strabismus Neg Hx     Stroke Neg Hx     Melanoma Neg Hx     Basal cell carcinoma Neg Hx     Squamous cell carcinoma Neg Hx     Substance Abuse Disorder Neg Hx     Mental illness Neg Hx      Social Hx:   Social History     Socioeconomic History    Marital status: Single   Tobacco Use    Smoking status: Never    Smokeless tobacco: Never   Vaping Use  Vaping Use: Never used   Substance and Sexual Activity    Alcohol use: No    Drug use: No    Sexual activity: Not Currently     Birth control/protection: Post-menopausal   Other Topics Concern    Do you use sunscreen? No    Tanning bed use? No    Are you easily burned? No    Excessive sun exposure? No    Blistering sunburns? 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 From [place] originally.        ** Merged History Encounter **     Social Determinants of Health     Financial Resource Strain: Low Risk  (05/31/2021)    Overall Financial Resource Strain (CARDIA)     Difficulty of Paying Living Expenses: Not hard at all   Food Insecurity: No Food Insecurity (05/31/2021)    Hunger Vital Sign     Worried About Running Out of Food in the Last Year: Never true     Ran Out of Food in the Last Year: Never true   Transportation Needs: No Transportation Needs (05/31/2021)    PRAPARE - Therapist, art (Medical): No     Lack of Transportation (Non-Medical): No       Medications:   Current Outpatient Medications   Medication Sig Dispense Refill    amlodipine (NORVASC) 5 MG tablet Take 1 tablet (5 mg total) by mouth daily. 90 tablet 3    aspirin (ECOTRIN) 81 MG tablet Take 1 tablet (81 mg total) by mouth daily. 90 tablet 3    atorvastatin (LIPITOR) 40 MG tablet Take 1 tablet (40 mg total) by mouth daily. 90 tablet 3    BD INSULIN SYRINGE ULT-FINE II 1 mL 31 gauge x 5/16 Syrg 1 Package by Miscellaneous route Two (2) times a day. 100 each 3    benzonatate (TESSALON) 100 MG capsule TAKE 1 CAPSULE BY MOUTH THREE TIMES DAILY AS NEEDED FOR COUGH      blood sugar diagnostic (ONETOUCH ULTRA BLUE TEST STRIP) Strp USE TO CHECK BLOOD SUGAR UP TO 10 TIMES DAILY AS NEEDED 600 strip 1    cetirizine (ZYRTEC) 10 MG tablet Take 1 tablet (10 mg total) by mouth daily. 90 tablet 1    clindamycin (CLEOCIN T) 1 % lotion Apply topically every morning. 60 mL 5    clindamycin (CLEOCIN T) 1 % Swab Apply 1 application topically Two (2) times a day. 60 each 11    clobetasol (TEMOVATE) 0.05 % ointment Apply topically two (2) times a day. To lesion in groin 30 g 1    dexamethasone (DECADRON) 0.1 % ophthalmic solution Administer 4 drops into the left ear Two (2) times a day for 30 days 5 mL 0    dextran 70-hypromellose, PF, (TEARS NATURAL FREE) 0.1-0.3 % Dpet       diclofenac sodium (VOLTAREN) 1 % gel Apply 2 g topically four (4) times a day. 100 g 0    dorzolamide-timolol (COSOPT) 22.3-6.8 mg/mL ophthalmic solution Administer 1 drop to both eyes two (2) times a day. 10 mL 12    dulaglutide (TRULICITY) 0.75 mg/0.5 mL injection pen Inject 0.5 mL (0.75 mg total) under the skin every seven (7) days. 6 mL 1    DULoxetine (CYMBALTA) 60 MG capsule Take 1 capsule (60 mg total) by mouth daily. 90 capsule 1    empagliflozin (JARDIANCE) 25  mg tablet Take 1 tablet (25 mg total) by mouth daily. 90 tablet 3    empty container Misc Use as directed to dispose of Humira pens. 1 each 2    flash glucose sensor (FREESTYLE LIBRE 2 SENSOR) kit Use to monitor blood glucose levels continuously. Change sensor every 10 days 3 each 11    folic acid (FOLVITE) 1 MG tablet Take 1 tablet (1 mg total) by mouth daily. 30 tablet 3    folic acid/multivit-min/lutein (CENTRUM SILVER ORAL) Take 1 tablet by mouth daily at 0600.       insulin glargine (BASAGLAR, LANTUS) 100 unit/mL (3 mL) injection pen Inject 0.5 mL (50 Units total) under the skin nightly. 45 mL 1    losartan (COZAAR) 25 MG tablet Take 1 tablet (25 mg total) by mouth daily. 90 tablet 3    metFORMIN (GLUCOPHAGE) 1000 MG tablet Take 1 tablet (1,000 mg total) by mouth 2 (two) times a day with meals. 180 tablet 3    methotrexate 2.5 MG tablet Take 8 tablets (20 mg total) by mouth once a week. 96 tablet 0    NON FORMULARY Low dose Naltrexone 4.5 mg PO daily 30 each 1    omeprazole (PRILOSEC) 20 MG capsule Take 1 capsule (20 mg total) by mouth daily. 90 capsule 3    oxyCODONE-acetaminophen (PERCOCET) 5-325 mg per tablet Take 1 tablet by mouth daily as needed for pain. 30 tablet 0    pen needle, diabetic (ULTICARE PEN NEEDLE) 32 gauge x 5/32 (4 mm) Ndle Use as directed two (2) times a day 100 each 12    polyethylene glycol (GLYCOLAX) 17 gram/dose powder Take 17 g by mouth daily. 238 g 11    predniSONE (DELTASONE) 10 MG tablet       predniSONE (DELTASONE) 20 MG tablet Take 1 tablet (20 mg total) by mouth daily. 30 tablet 0    pregabalin (LYRICA) 25 MG capsule Take 1 capsule in the morning and 2 capsules at night. 90 capsule 0    traMADoL (ULTRAM) 50 mg tablet 1/2 tab twice daily as needed 30 tablet 0    traZODone (DESYREL) 50 MG tablet Take 1 tablet (50 mg total) by mouth nightly. 90 tablet 3    blood-glucose meter kit Use as instructed 1 each 0    fluticasone propionate (FLONASE) 50 mcg/actuation nasal spray 1 spray into each nostril two (2) times a day. 48 g 1    lancing device Misc USE 4 TIMES DAILY BEFORE MEALS AND NIGHTLY ( USAR CUATRO VECES AL DIA SPBM Y TODAS LAS NOCHES ) 1 each 1     No current facility-administered medications for this visit.     Facility-Administered Medications Ordered in Other Visits   Medication Dose Route Frequency Provider Last Rate Last Admin    matrix hemostatic sealant (FLOSEAL) topical             matrix hemostatic sealant (FLOSEAL) topical                Vitals:    04/22/22 1407   BP: 117/74   BP Site: L Arm   BP Position: Sitting   BP Cuff Size: Medium   Pulse: 82   Temp: 36.3 ??C (97.3 ??F)   TempSrc: Temporal   Weight: 80.7 kg (178 lb)           Physical Exam  - GENERAL: WD/WN, appears comfortable  - HEAD: normocephalic, atraumatic  - EYES: no scleral icterus, EOMI  - ENT:  MMM, no nasal or oral lesions, oropharynx without erythema   - NECK: supple, no palpable lymphadenopathy  - CARDIO: RRR, no murmurs  - PULM: CTAB, symmetric air movement  - SKIN: no visible rashes on the head, neck, or extremities; did not examine axilla or groin  - NEURO: alert and oriented x 3    - MSK:  Normal ROM of cervical spine  Limited active ROM of both shoulders > 90 degrees; normal passive ROM of b/l shoulders  2nd-4th MCPs with swelling  Difficulty making a complete fist   Left wrist with significant tenderness with flexion and extension  TTP at the R SI joint  Diffusely tender to palpation of the posterior neck and back musculature, as well as upper and lower extremities

## 2022-04-23 MED ORDER — DICLOFENAC 1 % TOPICAL GEL
Freq: Four times a day (QID) | TOPICAL | 0 refills | 13 days | Status: CP
Start: 2022-04-23 — End: 2023-04-23

## 2022-04-28 NOTE — Unmapped (Signed)
Infusion outside of /Non-hospital based facility  Referral: [facility]  Phone: [phone]  Fax: [phone], and [phone]  Medication: Inflectra 5mg /kg. Induction, then q8 weeks  Labs: CBC/D and CMP  Start date: pending    Referral packet faxed 04/28/22 with confirmation

## 2022-05-02 NOTE — Unmapped (Signed)
[facility] PT AQUATIC THERAPY [place]  OUTPATIENT PHYSICAL THERAPY  05/02/2022          Patient Name: Anne Barber  Date of Birth:01/25/1961  Diagnosis:   Encounter Diagnoses   Name Primary?    Chronic bilateral low back pain without sciatica Yes    Difficulty walking     Fibromyalgia      Referring Provider: Pcp, None Per Patient    Date of Onset of Impairment: 05/14/2021  Date PT Care Plan Established or Reviewed: 02/11/2022  Date PT Treatment Started: 02/11/2022     Plan of Care Effective Date:   Authorization Period: 03/28/2022 - 03/28/2023   Referral Status: Authorized   Requested visits: 27     Interpretor: declined by patient     Session Number:  3    ASSESSMENT & PLAN   Assessment  Assessment details:    Today:  Trinka presents for aquatic physical therapy. The patient benefited from multiplanar hydrodynamic resistance during  3 way hip and weighted long arc quads. Pt was able to work on strengthening the knee extensors with no pain in the L LE. There was pain in the R LE during the knee flexion of long arc quads so the weights were removed and pt was able to complete exercise with no pain. The buoyancy properties of water enabled the patient to complete Ai Chi exercises and sit to stands. Pt gained confidence while doing sit to stands and progressed to performing them independently. Ai chi exercises were limited on pt's understanding of position but by the end of session pt had a better understanding of position. Pt will benefit from more Ai Chi in the future. Pt's fear of the water was better this session but still required assistance for some exercises due to fear. Aquatic exercise intensity and duration adjusted to appropriately challenge and progress functional strength deficits. Kierria will benefit from continued skilled physical therapy to maximize her functional independence and safety.        PT evaluation:   02/11/22: Anne Barber is a pleasant 62 y.o. female who presents for Physical Therapy with chronic low back pain and multi joint pain including: neck, hip, knee, ankle, elbow and hand. Primary impairments include decreased functional endurance and mobility.  30s chair stand test demonstrates decreased functional leg strength. Patient reported outcome measure, ODI, demonstrated significant disability. Pt continues to see Rheumatology for Fibromyalgia and seronegative spondyloarthropathy     03/03/22: ED: This patient presents with atypical chest pain, most likely secondary to musculoskeletal injury. Differential diagnosis includes rib fracture, costochondritis, sternal fracture. Low suspicion for ACS, acute PE (PERC negative), pericarditis / myocarditis, thoracic aortic dissection, pneumothorax, pneumonia or other acute infectious process. Presentation not consistent with other acute, emergent causes of chest pain at this time. No indication for cardiac enzyme testing. Plan to order CXR to evaluate for acute cardiopulmonary causes.    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                 Impairments: decreased endurance, pain, decreased strength, decreased range of motion, impaired motor control, gait deviation, fall risk, impaired balance, impaired coordination, impaired flexibility, impaired ADLs, decreased mobility, core weakness and obesity    Other impairment: low vision, hearing impairment    Personal Factors/Comorbidities: 3+    Specific Comorbidities: HTN, DM2, chronic pain, fibromyalgia, depression with aniexty, impaired vision, multijoint pain  Examination of Body Systems: musculoskeletal, activity/participation and communication    Clinical Presentation: evolving    Clinical Decision Making: moderate    Prognosis: good prognosis    Positive Prognosis Rationale: caregiver/family support.  Negative Prognosis Rationale: medical status/condition, chronicity of condition and severity of symptoms.      Therapy Goals      Goals:        1. In 12 weeks the patient will demonstrate independent performance of HEP to maintain functional gains.   2. In 12 weeks the patient will complete an additional 3 STS transfers during the 30s chair stand test to met the MDC and to demonstrate increased functional leg strength.  3. In 12 weeks the patient will score a 10 point change on the ODI to demonstrate the MDC (0-100%; lower score indicates a lesser level of disability) and to indicate improved activity tolerance.         Plan    Therapy options: will be seen for skilled physical therapy services    Planned therapy interventions: Aquatic Therapy, Location manager, Education - Patient, Endurance Activites, Functional Mobility, Gait Training, Home Exercise Program, Manual Therapy, Neuromuscular Re-education, Therapeutic Activities, Therapeutic Exercises, Education - Family/Caregiver, Dry Needling, E-Stim, TENS, Self-Care/Home Training, Civil engineer, contracting and Diaphragmatic/Pursed-lip Breathing    DME Equipment: [employer].    Frequency: biweekly.    Duration in weeks: 12    Education provided to: patient and family.    Education provided: HEP, Treatment options and plan, Symptom management, Safety education, Importance of Therapy, Anatomy, Body mechanics, Role of therapy in Rehabilitation, Posture, Community resources and Body awareness    Education results: verbalized good understanding, demonstrates understanding and needs reinforcement.    Communication/Consultation: N/A.              SUBJECTIVE   Interpreter Use: [employer] Video Interpreter    History of Present Condition      History of Present Condition/Chief Complaint:       Multi joint pain, Fibromyalgia [M79.7]  - Primary  Reason for Exam  Comments: Water therapy        Subjective:     Today: Pt reports no pain today except in wrist. Pain in wrist is 5/10. Pt reports feeling good after last session, though she was tired/sore for 2 days, but reports it was not severe.     Pain  Current pain rating: 5  At worst pain rating: 10  Location: B wrists, toes, left sided lumbar spine, right knee, right ankle  Quality: aching  Relieving factors: medications  Aggravating factors: on the move and walking  Pain Related Behaviors: crying  Progression: no change  Red flags: none.    Precautions and Equipment  Precautions: None  Current Braces/Orthoses: None  Equipment Currently Used: Rolling walker  Prior Functional Status:     Functional Limitation(s)-No physical limitations  Current functional status: use of assistive device, unsteady gait, limited sitting tolerance, limited household activities, limited bending, difficulty with transfers, limited exercise, limited lifting, limited standing tolerance and limited walking tolerance  Social Support    Communication Preference: verbal and written  Barriers to Learning: hearing    Diagnostic Tests    Diagnostic Test Comments:       See EMR    Treatments  Previous treatment: home health PT      Patient Goals  Patient goals for therapy: decreased pain, improved ambulation and improved balance        OBJECTIVE     General Observations  Pleasant  female, NAD. Ambulates with rolling walker    Posture  Sitting: slouched  Standing: reduced lumbar lordosis and pain posturing       Range of Motion - Lumbar Spine     No ROM Loss  (0%) Min ROM Loss  (1-25%) Mod ROM Loss  (26-65%) Major ROM Loss  (66-100%)   Symptoms    Flexion   x  Increased pain   Extension    x Increased pain   Side Bend Right   x  Increased pain   Side Bend Left   x  Increased pain   Sideglide Right   x  Increased pain   Sideglide Left   x  Increased pain   Observable movement coordination impairments of the lumbopelvic region with flexion and extension movements or while performing daily physical activities:  YES         Neurological  Strength, tested in sitting   Right Left   Hip Flexion (L2) 4/5 4/5   Hip Abduction 4/5 4/5   Hip Adduction 4/5 4/5   Knee Extension (L3) 3+/5 3+/5   Knee Flexion 4/5 4/5   Ankle DF (L4) 4/5 4/5   Great toe extension 5/5 5/5   Ankle PF (S1) 5/5 5/5       Denies Sensory Changes      Palpation  TTP: B lumbar paraspinals     Transfers  Sit to stand: mod independent with increased time     Gait  Very antalgic gait. Needs UE assistance on RW. Decreased speed. Shuffle pattern.     Written Outcome Measures  Oswestry Score: 37 / 50 or 74 %   A score of 0-20% indicates minimal disability. 21-40% indicates moderate disability. 41-60% indicates severe disability. 61-80% indicates crippling disability. 81-100% indicates the patient may be bed bound and careful evaluation is recommended. The Minimal Detectable Change (MDC) is 10 points.     Functional Outcome Measures  30s STS: 2 reps with a lot of pain    TUG: 45 s     Aquatic Physical Therapy Attestation:   [place]ay will benefit from aquatic therapy given clinical presentation of: pain limiting functional movement or exercise tolerance, strength limiting functional movement or exercise, limited tolerance to weightbearing, decreased coordination, motor control deficits, static and/or dynamic balance deficits, and gait abnormalities    During initial Physical Therapy Evaluation the patient was screened and identified the following precautions:   HTN (controlled) - patient and/or therapist will monitor BP  DM2 (controlled) - patient will monitor BG and bring snacks    The following conditions require medical provider clearance prior to aquatic therapy:   No conditions identified     The following conditions were not reported or observed: nausea/vomiting, fever greater than 100 deg, diarrhea/bowel incontinence in the last two weeks, viral/bacterial/fungal disease. The patient verbalizes understanding that the session will be cancelled if any of the above conditions develop. YES    Aquatic Therapy Education:  - Discussed POC: bridge to land therapy  - Advised patient to wear shoes and bring assistive device if applicable while ambulating on pool deck   - Advised patient to bring water and snacks as appropriate   - Advised patient only members of the [facility] are permitted to use amenities such as the hot tub and sauna  - Land re-evaluation scheduled within 30 days of initial pool session by evaluating therapist: yes  - Anticipated method of entry:  Requires use of lift for enter/exit (400  lb weight limit)  - Focus for aquatic therapist: Bridge to land therapy, Pain reduction, Improve weight bearing tolerance, LE Strengthening, Gait Training, and Core Strengthening   * nervous about water, but willing to try with therapist close by    TREATMENT RENDERED         Total Treatment Time: 45 Minutes    Aquatic Therapy: 45 Minutes  Performed with direct PT instruction, demonstration, supervision, and guidance  - Reassessment of symptoms     Ambulation: in 3-4 ft depth water x 3 laps each while holding on to barre  - sidestepping  - backward     -Seated LE on pool bench   B long arc quads with 5# ankle weights:2x10   R long arc quad modified on second set > no ankle weight: 1x10    - STS from bench on 8 in step: 1x10    Ai Chi series: Chest Depth gentle, slow muscle activation/relaxation movement sequence derived from Ai Chi principles  x10 ea for pain reduction  - Gathering: B shoulder / thoracic spine UNILATERAL rotation with rail assistance, shoulders at 90 deg ABD    Standing LE: in 3 ft depth water x 5 reps bilaterally  - hip abd  - hip flex  - hip ext      Next treamtent session:  long aqrc quads with weight again with weight, sit to stand with squat at top, ai chi          Therapeutic Interventions Charges  $$ Aquatic Therapy [mins]: 45                  I attest that I have reviewed the above information.  Signed: Cassell Smiles, PT, DPT  05/02/2022 4:08 PM    The care for this patient was completed by Cassell Smiles, PT:  A student was present and participated in the care. Licensed/Credentialed therapist was physically present and immediately available to direct and supervise tasks that were related to patient management. The direction and supervision was continuous throughout the time these tasks were performed.    Cassell Smiles, PT        I reviewed the no-show/attendance policy with the patient and caregiver(s). The patient is aware that they must call to cancel appointments more than 24 hours in advance. They are also aware that if they late cancel or no-show three times, we reserve the right to cancel their remaining appointments. This policy is in place to allow [place] to best serve the needs of our caseload.    If patient returns to clinic with variance in plan of care, then it may be attributable to one or more of the following factors: preferred clinician availability, appointment time request availability, therapy pool appointment availability, major holiday with clinic closure, caregiver availability, patient transportation, conflicting medical appointment, inclement weather, and/or patient illness.    If patient does not return for follow up visit(s) related to this episode of care, this note will serve as their discharge note from Physical Therapy.

## 2022-05-04 DIAGNOSIS — L732 Hidradenitis suppurativa: Principal | ICD-10-CM

## 2022-05-04 MED ORDER — ADALIMUMAB PEN CITRATE FREE 40 MG/0.4 ML
SUBCUTANEOUS | 11 refills | 28 days
Start: 2022-05-04 — End: 2023-05-04

## 2022-05-04 NOTE — Unmapped (Signed)
[facility] Refill Coordination Note    Specialty Medication(s) to be Shipped:   Inflammatory Disorders: Humira    Other medication(s) to be shipped:  Clobetasol ointment, zyrtec, metformin, trazodone.     Anne Barber, DOB: 02/07/1961  Phone: [phone] (home)       All above HIPAA information was verified with patient's family member, Anne Barber.     Was a Nurse, learning disability used for this call? Yes, Spanish (but Anne Barber preferred to switch to [place]). Patient language is appropriate in [facility]    Completed refill call assessment today to schedule patient's medication shipment from the [facility] [phone]).  All relevant notes have been reviewed.     Specialty medication(s) and dose(s) confirmed: Regimen is correct and unchanged.   Changes to medications:  Anne Barber reports that Provider wanted to increase Humira dosage to 80 mg strength but insurance would not approve it. Anne Barber will be starting an in-office infusion soon and will then discontinue Humira.  Changes to insurance: No  New side effects reported not previously addressed with a pharmacist or physician: None reported  Questions for the pharmacist: No    Confirmed patient received a Conservation officer, historic buildings and a Surveyor, mining with first shipment. The patient will receive a drug information handout for each medication shipped and additional FDA Medication Guides as required.       DISEASE/MEDICATION-SPECIFIC INFORMATION        For patients on injectable medications: Patient currently has 1 doses left.  Next injection is scheduled for 05/09/2022.    SPECIALTY MEDICATION ADHERENCE     Medication Adherence    Patient reported X missed doses in the last month: 0  Specialty Medication: Humira  Patient is on additional specialty medications: No  Informant: child/children                                Were doses missed due to medication being on hold? No    Humira 40/0.4 mg/ml: 5 days of medicine on hand       REFERRAL TO PHARMACIST     Referral to the pharmacist: Not needed      [facility]     Shipping address confirmed in Epic.     Delivery Scheduled: Yes, Expected medication delivery date: 05/06/2022.  However, Rx request for refills was sent to the provider as there are none remaining.     Medication will be delivered via Same Day Courier to the prescription address in Epic WAM.    Anne Barber   [facility] Specialty Technician

## 2022-05-06 NOTE — Unmapped (Signed)
Anne Barber 's HUMIRA(CF) PEN 40 mg/0.4 mL injection (adalimumab) shipment will be delayed as a result of no refills remain on the prescription.      I have reached out to the patient  at [phone] and communicated the delay. We will call the patient back to reschedule the delivery upon resolution. We have not confirmed the new delivery date.

## 2022-05-07 MED ORDER — ADALIMUMAB PEN CITRATE FREE 40 MG/0.4 ML
SUBCUTANEOUS | 11 refills | 28 days | Status: CP
Start: 2022-05-07 — End: 2023-05-07
  Filled 2022-05-16: qty 4, 28d supply, fill #0

## 2022-05-11 MED ORDER — OXYCODONE-ACETAMINOPHEN 5 MG-325 MG TABLET
ORAL_TABLET | Freq: Every day | ORAL | 0 refills | 30 days | PRN
Start: 2022-05-11 — End: ?

## 2022-05-11 MED FILL — TRAZODONE 50 MG TABLET: ORAL | 90 days supply | Qty: 90 | Fill #1

## 2022-05-11 MED FILL — CLOBETASOL 0.05 % TOPICAL OINTMENT: TOPICAL | 14 days supply | Qty: 30 | Fill #1

## 2022-05-11 MED FILL — CETIRIZINE 10 MG TABLET: ORAL | 30 days supply | Qty: 30 | Fill #3

## 2022-05-11 MED FILL — METFORMIN 1,000 MG TABLET: ORAL | 90 days supply | Qty: 180 | Fill #1

## 2022-05-12 NOTE — Unmapped (Signed)
[facility] PT AQUATIC THERAPY [place]  OUTPATIENT PHYSICAL THERAPY  05/16/2022  Note Type: Treatment Note       Patient Name: Anne Barber  Date of Birth:[birth year]  Diagnosis:   Encounter Diagnoses   Name Primary?    Chronic bilateral low back pain without sciatica Yes    Difficulty walking     Fibromyalgia      Referring Provider: Pcp, None Per Patient    Date of Onset of Impairment: 05/14/2021  Date PT Care Plan Established or Reviewed: 02/11/2022  Date PT Treatment Started: 02/11/2022     Plan of Care Effective Date:   Authorization Period: 03/28/2022 - 03/28/2023   Referral Status: Authorized   Requested visits: 27     Interpretor: pt's daughter acting at [employer] per pt's request.     Session Number:  4    Aquatic HEP: pt's daughter reports they have [facility] access nearby.   ASSESSMENT & PLAN   Assessment  Assessment details:    Today:  Anne Barber presents for aquatic physical therapy. The patient was pain limited today with severe R wrist, ankle, knee, and back pain. The patient benefited from multiplanar hydrodynamic resistance during Ai Chi, and 4-way ambulation. The buoyancy properties of water enabled the patient to complete functional sit to stands and LE strengthening exercise.  Pt's fear of the water continues to diminish as she was able to ambulate and exercise with intermittent UE assist at the barre. Aquatic exercise intensity and duration adjusted to appropriately challenge and progress functional strength deficits. Anne Barber will benefit from continued skilled physical therapy to maximize her functional independence and safety.        PT evaluation:   02/11/22: Anne Barber is a pleasant 62 y.o. female who presents for Physical Therapy with chronic low back pain and multi joint pain including: neck, hip, knee, ankle, elbow and hand. Primary impairments include decreased functional endurance and mobility.  30s chair stand test demonstrates decreased functional leg strength. Patient reported outcome measure, ODI, demonstrated significant disability. Pt continues to see Rheumatology for Fibromyalgia and seronegative spondyloarthropathy     03/03/22: ED: This patient presents with atypical chest pain, most likely secondary to musculoskeletal injury. Differential diagnosis includes rib fracture, costochondritis, sternal fracture. Low suspicion for ACS, acute PE (PERC negative), pericarditis / myocarditis, thoracic aortic dissection, pneumothorax, pneumonia or other acute infectious process. Presentation not consistent with other acute, emergent causes of chest pain at this time. No indication for cardiac enzyme testing. Plan to order CXR to evaluate for acute cardiopulmonary causes.    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                 Impairments: decreased endurance, pain, decreased strength, decreased range of motion, impaired motor control, gait deviation, fall risk, impaired balance, impaired coordination, impaired flexibility, impaired ADLs, decreased mobility, core weakness and obesity    Other impairment: low vision, hearing impairment    Personal Factors/Comorbidities: 3+    Specific Comorbidities: HTN, DM2, chronic pain, fibromyalgia, depression with aniexty, impaired vision, multijoint pain    Examination of Body Systems: musculoskeletal, activity/participation and communication    Clinical Presentation: evolving    Clinical Decision Making: moderate    Prognosis: good prognosis    Positive Prognosis Rationale: caregiver/family support.  Negative Prognosis Rationale: medical status/condition, chronicity of condition and severity of symptoms.      Therapy Goals  Goals:        1. In 12 weeks the patient will demonstrate independent performance of HEP to maintain functional gains.   2. In 12 weeks the patient will complete an additional 3 STS transfers during the 30s chair stand test to met the MDC and to demonstrate increased functional leg strength.  3. In 12 weeks the patient will score a 10 point change on the ODI to demonstrate the MDC (0-100%; lower score indicates a lesser level of disability) and to indicate improved activity tolerance.         Plan    Therapy options: will be seen for skilled physical therapy services    Planned therapy interventions: Aquatic Therapy, Location manager, Education - Patient, Endurance Activites, Functional Mobility, Gait Training, Home Exercise Program, Manual Therapy, Neuromuscular Re-education, Therapeutic Activities, Therapeutic Exercises, Education - Family/Caregiver, Dry Needling, E-Stim, TENS, Self-Care/Home Training, Civil engineer, contracting and Diaphragmatic/Pursed-lip Breathing    DME Equipment: [employer].    Frequency: biweekly.    Duration in weeks: 12    Education provided to: patient and family.    Education provided: HEP, Treatment options and plan, Symptom management, Safety education, Importance of Therapy, Anatomy, Body mechanics, Role of therapy in Rehabilitation, Posture, Community resources and Body awareness    Education results: verbalized good understanding, demonstrates understanding and needs reinforcement.    Communication/Consultation: N/A.              SUBJECTIVE   Interpreter Use: [employer] Interpreter    History of Present Condition      History of Present Condition/Chief Complaint:       Multi joint pain, Fibromyalgia [M79.7]  - Primary  Reason for Exam  Comments: Water therapy        Subjective:     Today: Pt reports she has pain the day after aquatic PT, but the next few days she feels good. On weeks without aquatic therapy she is in pain everyday.     Pain  Current pain rating: 5  At worst pain rating: 10  Location: B wrists, toes, left sided lumbar spine, right knee, right ankle  Quality: aching  Relieving factors: medications  Aggravating factors: on the move and walking  Pain Related Behaviors: crying  Progression: no change  Red flags: none.    Precautions and Equipment  Precautions: None  Current Braces/Orthoses: None  Equipment Currently Used: Rolling walker  Prior Functional Status:     Functional Limitation(s)-No physical limitations  Current functional status: use of assistive device, unsteady gait, limited sitting tolerance, limited household activities, limited bending, difficulty with transfers, limited exercise, limited lifting, limited standing tolerance and limited walking tolerance  Social Support    Communication Preference: verbal and written  Barriers to Learning: hearing    Diagnostic Tests    Diagnostic Test Comments:       See EMR    Treatments  Previous treatment: home health PT      Patient Goals  Patient goals for therapy: decreased pain, improved ambulation and improved balance        OBJECTIVE     General Observations  Pleasant female, NAD. Ambulates with rolling walker    Posture  Sitting: slouched  Standing: reduced lumbar lordosis and pain posturing       Range of Motion - Lumbar Spine     No ROM Loss  (0%) Min ROM Loss  (1-25%) Mod ROM Loss  (26-65%) Major ROM Loss  (66-100%)   Symptoms    Flexion  x  Increased pain   Extension    x Increased pain   Side Bend Right   x  Increased pain   Side Bend Left   x  Increased pain   Sideglide Right   x  Increased pain   Sideglide Left   x  Increased pain   Observable movement coordination impairments of the lumbopelvic region with flexion and extension movements or while performing daily physical activities:  YES         Neurological  Strength, tested in sitting   Right Left   Hip Flexion (L2) 4/5 4/5   Hip Abduction 4/5 4/5   Hip Adduction 4/5 4/5   Knee Extension (L3) 3+/5 3+/5   Knee Flexion 4/5 4/5   Ankle DF (L4) 4/5 4/5   Great toe extension 5/5 5/5   Ankle PF (S1) 5/5 5/5       Denies Sensory Changes      Palpation  TTP: B lumbar paraspinals     Transfers  Sit to stand: mod independent with increased time     Gait  Very antalgic gait. Needs UE assistance on RW. Decreased speed. Shuffle pattern.     Written Outcome Measures  Oswestry Score: 37 / 50 or 74 %   A score of 0-20% indicates minimal disability. 21-40% indicates moderate disability. 41-60% indicates severe disability. 61-80% indicates crippling disability. 81-100% indicates the patient may be bed bound and careful evaluation is recommended. The Minimal Detectable Change (MDC) is 10 points.     Functional Outcome Measures  30s STS: 2 reps with a lot of pain    TUG: 45 s     Aquatic Physical Therapy Attestation:   Heela Frydman will benefit from aquatic therapy given clinical presentation of: pain limiting functional movement or exercise tolerance, strength limiting functional movement or exercise, limited tolerance to weightbearing, decreased coordination, motor control deficits, static and/or dynamic balance deficits, and gait abnormalities    During initial Physical Therapy Evaluation the patient was screened and identified the following precautions:   HTN (controlled) - patient and/or therapist will monitor BP  DM2 (controlled) - patient will monitor BG and bring snacks    The following conditions require medical provider clearance prior to aquatic therapy:   No conditions identified     The following conditions were not reported or observed: nausea/vomiting, fever greater than 100 deg, diarrhea/bowel incontinence in the last two weeks, viral/bacterial/fungal disease. The patient verbalizes understanding that the session will be cancelled if any of the above conditions develop. YES    Aquatic Therapy Education:  - Discussed POC: bridge to land therapy  - Advised patient to wear shoes and bring assistive device if applicable while ambulating on pool deck   - Advised patient to bring water and snacks as appropriate   - Advised patient only members of the [facility] are permitted to use amenities such as the hot tub and sauna  - Land re-evaluation scheduled within 30 days of initial pool session by evaluating therapist: yes  - Anticipated method of entry:  Requires use of lift for enter/exit (400 lb weight limit)  - Focus for aquatic therapist: Bridge to land therapy, Pain reduction, Improve weight bearing tolerance, LE Strengthening, Gait Training, and Core Strengthening   * nervous about water, but willing to try with therapist close by    TREATMENT RENDERED         Total Treatment Time: 45 Minutes    Aquatic Therapy: 45 Minutes  Performed with direct PT instruction, demonstration, supervision, and guidance  - Reassessment of symptoms     Ambulation: in 3-4 ft depth water x 3 laps each while holding on to barre  - forward   sidestepping  - backward     -Seated LE on pool bench   B long arc quads with 5# ankle weights:2x10   Standing hamstring hurl 5# AW x2 B (declined to continue due to knee pain L > R)    Standing in 3 ft depth water 1x10 ea  - squats at barre  - split squats at barre, ea foot fw.    Ai Chi series: Chest Depth gentle, slow muscle activation/relaxation movement sequence derived from Ai Chi principles  x10 ea for pain reduction  - Gathering: B shoulder / thoracic spine UNILATERAL rotation with rail assistance, shoulders at 90 deg ABD  - Enclosing: B shoulder horz AB <> AD   - Folding: B shoulder ER<>IR with elbow flexion, and shoulders at 0 deg AB en weight shift BW and total spine EXT      Standing LE: in 3 ft depth water x 10 reps bilaterally, UE A with noodle only  - hip abd  - hip flex  - hip ext      Next treamtent session:  ai chi, progress to deep well traction if tolerated by pt.           Therapeutic Interventions Charges  $$ Aquatic Therapy [mins]: 45                  I attest that I have reviewed the above information.  Signed: Cassell Smiles, PT, DPT  05/16/2022 3:06 PM          I reviewed the no-show/attendance policy with the patient and caregiver(s). The patient is aware that they must call to cancel appointments more than 24 hours in advance. They are also aware that if they late cancel or no-show three times, we reserve the right to cancel their remaining appointments. This policy is in place to allow [place] to best serve the needs of our caseload.    If patient returns to clinic with variance in plan of care, then it may be attributable to one or more of the following factors: preferred clinician availability, appointment time request availability, therapy pool appointment availability, major holiday with clinic closure, caregiver availability, patient transportation, conflicting medical appointment, inclement weather, and/or patient illness.    If patient does not return for follow up visit(s) related to this episode of care, this note will serve as their discharge note from Physical Therapy.

## 2022-05-13 NOTE — Unmapped (Unsigned)
Patient called stating has been unable to fill her humira and needs refill (spoke with patient with spanish interpreter Mida with [employer] [phone]).  Called [facility] who has been trying to reach her to schedule shipment.   [facility] will call her now.

## 2022-05-13 NOTE — Unmapped (Signed)
Anne Barber's shipment of Humira has been rescheduled to deliver on 05/16/22 via same day courier.

## 2022-05-15 DIAGNOSIS — L732 Hidradenitis suppurativa: Principal | ICD-10-CM

## 2022-05-16 ENCOUNTER — Ambulatory Visit: Admit: 2022-05-16 | Payer: PRIVATE HEALTH INSURANCE

## 2022-05-16 ENCOUNTER — Ambulatory Visit
Admit: 2022-05-16 | Payer: PRIVATE HEALTH INSURANCE | Attending: Rehabilitative and Restorative Service Providers" | Primary: Rehabilitative and Restorative Service Providers"

## 2022-05-16 MED FILL — TRULICITY 0.75 MG/0.5 ML SUBCUTANEOUS PEN INJECTOR: SUBCUTANEOUS | 84 days supply | Qty: 6 | Fill #1

## 2022-05-18 NOTE — Unmapped (Signed)
Infusion outside of [place]/Non-hospital based facility  Referral: [facility]   Medication: Infliximab  Insurance:  Medicaid  Start date: 06/10/22

## 2022-05-26 NOTE — Unmapped (Unsigned)
[facility] PT AQUATIC THERAPY [place]  OUTPATIENT PHYSICAL THERAPY  05/30/2022          Patient Name: Anne Barber  Date of Birth:08-02-1960  Diagnosis:   Encounter Diagnoses   Name Primary?    Chronic bilateral low back pain without sciatica Yes    Difficulty walking     Fibromyalgia      Referring Provider:     Caffie Pinto, MD       Date of Onset of Impairment: 05/14/2021  Date PT Care Plan Established or Reviewed: 02/11/2022  Date PT Treatment Started: 02/11/2022     Plan of Care Effective Date:   Authorization Period: 03/28/2022 - 03/28/2023   Referral Status: Authorized   Requested visits: 27     Interpretor: pt's daughter acting at [employer] per pt's request.     Session Number:  5    Aquatic HEP: pt's daughter reports they have [facility] access nearby.   ASSESSMENT & PLAN   Assessment  Assessment details:    Today:  Anne Barber presents for aquatic physical therapy. The patient was pain limited today with severe R wrist, ankle, knee, and back pain. The patient benefited from multiplanar hydrodynamic resistance during Ai Chi, and 4-way ambulation. The buoyancy properties of water enabled the patient to complete functional sit to stands and LE strengthening exercise.  Pt's fear of the water continues to diminish as she was able to ambulate and exercise with intermittent UE assist at the barre. Aquatic exercise intensity and duration adjusted to appropriately challenge and progress functional strength deficits. Anne Barber will benefit from continued skilled physical therapy to maximize her functional independence and safety.        PT evaluation:   02/11/22: Anne Barber is a pleasant 62 y.o. female who presents for Physical Therapy with chronic low back pain and multi joint pain including: neck, hip, knee, ankle, elbow and hand. Primary impairments include decreased functional endurance and mobility.  30s chair stand test demonstrates decreased functional leg strength. Patient reported outcome measure, ODI, demonstrated significant disability. Pt continues to see Rheumatology for Fibromyalgia and seronegative spondyloarthropathy     03/03/22: ED: This patient presents with atypical chest pain, most likely secondary to musculoskeletal injury. Differential diagnosis includes rib fracture, costochondritis, sternal fracture. Low suspicion for ACS, acute PE (PERC negative), pericarditis / myocarditis, thoracic aortic dissection, pneumothorax, pneumonia or other acute infectious process. Presentation not consistent with other acute, emergent causes of chest pain at this time. No indication for cardiac enzyme testing. Plan to order CXR to evaluate for acute cardiopulmonary causes.    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                 Impairments: decreased endurance, pain, decreased strength, decreased range of motion, impaired motor control, gait deviation, fall risk, impaired balance, impaired coordination, impaired flexibility, impaired ADLs, decreased mobility, core weakness and obesity    Other impairment: low vision, hearing impairment    Personal Factors/Comorbidities: 3+    Specific Comorbidities: HTN, DM2, chronic pain, fibromyalgia, depression with aniexty, impaired vision, multijoint pain    Examination of Body Systems: musculoskeletal, activity/participation and communication    Clinical Presentation: evolving    Clinical Decision Making: moderate    Prognosis: good prognosis    Positive Prognosis Rationale: caregiver/family support.  Negative Prognosis Rationale: medical status/condition, chronicity of condition and severity of symptoms.  Therapy Goals      Goals:        1. In 12 weeks the patient will demonstrate independent performance of HEP to maintain functional gains.   2. In 12 weeks the patient will complete an additional 3 STS transfers during the 30s chair stand test to met the MDC and to demonstrate increased functional leg strength.  3. In 12 weeks the patient will score a 10 point change on the ODI to demonstrate the MDC (0-100%; lower score indicates a lesser level of disability) and to indicate improved activity tolerance.         Plan    Therapy options: will be seen for skilled physical therapy services    Planned therapy interventions: Aquatic Therapy, Location manager, Education - Patient, Endurance Activites, Functional Mobility, Gait Training, Home Exercise Program, Manual Therapy, Neuromuscular Re-education, Therapeutic Activities, Therapeutic Exercises, Education - Family/Caregiver, Dry Needling, E-Stim, TENS, Self-Care/Home Training, Civil engineer, contracting and Diaphragmatic/Pursed-lip Breathing    DME Equipment: [employer].    Frequency: biweekly.    Duration in weeks: 12    Education provided to: patient and family.    Education provided: HEP, Treatment options and plan, Symptom management, Safety education, Importance of Therapy, Anatomy, Body mechanics, Role of therapy in Rehabilitation, Posture, Community resources and Body awareness    Education results: verbalized good understanding, demonstrates understanding and needs reinforcement.    Communication/Consultation: N/A.              SUBJECTIVE   Interpreter Use: [employer] Interpreter    History of Present Condition      History of Present Condition/Chief Complaint:       Multi joint pain, Fibromyalgia [M79.7]  - Primary  Reason for Exam  Comments: Water therapy        Subjective:     Today: Pt reports she has pain the day after aquatic PT, but the next few days she feels good. On weeks without aquatic therapy she is in pain everyday.     Pain  Current pain rating: 5  At worst pain rating: 10  Location: B wrists, toes, left sided lumbar spine, right knee, right ankle  Quality: aching  Relieving factors: medications  Aggravating factors: on the move and walking  Pain Related Behaviors: crying  Progression: no change  Red flags: none.    Precautions and Equipment  Precautions: None  Current Braces/Orthoses: None  Equipment Currently Used: Rolling walker  Prior Functional Status:     Functional Limitation(s)-No physical limitations  Current functional status: use of assistive device, unsteady gait, limited sitting tolerance, limited household activities, limited bending, difficulty with transfers, limited exercise, limited lifting, limited standing tolerance and limited walking tolerance  Social Support    Communication Preference: verbal and written  Barriers to Learning: hearing    Diagnostic Tests    Diagnostic Test Comments:       See EMR    Treatments  Previous treatment: home health PT      Patient Goals  Patient goals for therapy: decreased pain, improved ambulation and improved balance        OBJECTIVE     General Observations  Pleasant female, NAD. Ambulates with rolling walker    Posture  Sitting: slouched  Standing: reduced lumbar lordosis and pain posturing       Range of Motion - Lumbar Spine     No ROM Loss  (0%) Min ROM Loss  (1-25%) Mod ROM Loss  (26-65%) Major ROM Loss  (66-100%)  Symptoms    Flexion   x  Increased pain   Extension    x Increased pain   Side Bend Right   x  Increased pain   Side Bend Left   x  Increased pain   Sideglide Right   x  Increased pain   Sideglide Left   x  Increased pain   Observable movement coordination impairments of the lumbopelvic region with flexion and extension movements or while performing daily physical activities:  YES         Neurological  Strength, tested in sitting   Right Left   Hip Flexion (L2) 4/5 4/5   Hip Abduction 4/5 4/5   Hip Adduction 4/5 4/5   Knee Extension (L3) 3+/5 3+/5   Knee Flexion 4/5 4/5   Ankle DF (L4) 4/5 4/5   Great toe extension 5/5 5/5   Ankle PF (S1) 5/5 5/5       Denies Sensory Changes      Palpation  TTP: B lumbar paraspinals     Transfers  Sit to stand: mod independent with increased time     Gait  Very antalgic gait. Needs UE assistance on RW. Decreased speed. Shuffle pattern.     Written Outcome Measures  Oswestry Score: 37 / 50 or 74 %   A score of 0-20% indicates minimal disability. 21-40% indicates moderate disability. 41-60% indicates severe disability. 61-80% indicates crippling disability. 81-100% indicates the patient may be bed bound and careful evaluation is recommended. The Minimal Detectable Change (MDC) is 10 points.     Functional Outcome Measures  30s STS: 2 reps with a lot of pain    TUG: 45 s     Aquatic Physical Therapy Attestation:   Athea Heiliger will benefit from aquatic therapy given clinical presentation of: pain limiting functional movement or exercise tolerance, strength limiting functional movement or exercise, limited tolerance to weightbearing, decreased coordination, motor control deficits, static and/or dynamic balance deficits, and gait abnormalities    During initial Physical Therapy Evaluation the patient was screened and identified the following precautions:   HTN (controlled) - patient and/or therapist will monitor BP  DM2 (controlled) - patient will monitor BG and bring snacks    The following conditions require medical provider clearance prior to aquatic therapy:   No conditions identified     The following conditions were not reported or observed: nausea/vomiting, fever greater than 100 deg, diarrhea/bowel incontinence in the last two weeks, viral/bacterial/fungal disease. The patient verbalizes understanding that the session will be cancelled if any of the above conditions develop. YES    Aquatic Therapy Education:  - Discussed POC: bridge to land therapy  - Advised patient to wear shoes and bring assistive device if applicable while ambulating on pool deck   - Advised patient to bring water and snacks as appropriate   - Advised patient only members of the [facility] are permitted to use amenities such as the hot tub and sauna  - Land re-evaluation scheduled within 30 days of initial pool session by evaluating therapist: yes  - Anticipated method of entry:  Requires use of lift for enter/exit (400 lb weight limit)  - Focus for aquatic therapist: Bridge to land therapy, Pain reduction, Improve weight bearing tolerance, LE Strengthening, Gait Training, and Core Strengthening   * nervous about water, but willing to try with therapist close by    TREATMENT RENDERED         Total Treatment Time: 45 Minutes  Aquatic Therapy: 45 Minutes  Performed with direct PT instruction, demonstration, supervision, and guidance  - Reassessment of symptoms     Ambulation: in 3-4 ft depth water x 3 laps each while holding on to barre  - forward   sidestepping  - backward     -Seated LE on pool bench   B long arc quads with 5# ankle weights:2x10   Standing hamstring hurl 5# AW x2 B (declined to continue due to knee pain L > R)    Standing in 3 ft depth water 1x10 ea  - squats at barre  - split squats at barre, ea foot fw.    Ai Chi series: Chest Depth gentle, slow muscle activation/relaxation movement sequence derived from Ai Chi principles  x10 ea for pain reduction  - Gathering: B shoulder / thoracic spine UNILATERAL rotation with rail assistance, shoulders at 90 deg ABD  - Enclosing: B shoulder horz AB <> AD   - Folding: B shoulder ER<>IR with elbow flexion, and shoulders at 0 deg AB en weight shift BW and total spine EXT      Standing LE: in 3 ft depth water x 10 reps bilaterally, UE A with noodle only  - hip abd  - hip flex  - hip ext      Next treamtent session:  ai chi, progress to deep well traction if tolerated by pt.                              I attest that I have reviewed the above information.  Signed: Cassell Smiles, PT, DPT  05/26/2022 7:50 AM          I reviewed the no-show/attendance policy with the patient and caregiver(s). The patient is aware that they must call to cancel appointments more than 24 hours in advance. They are also aware that if they late cancel or no-show three times, we reserve the right to cancel their remaining appointments. This policy is in place to allow [place] to best serve the needs of our caseload.    If patient returns to clinic with variance in plan of care, then it may be attributable to one or more of the following factors: preferred clinician availability, appointment time request availability, therapy pool appointment availability, major holiday with clinic closure, caregiver availability, patient transportation, conflicting medical appointment, inclement weather, and/or patient illness.    If patient does not return for follow up visit(s) related to this episode of care, this note will serve as their discharge note from Physical Therapy.

## 2022-05-27 DIAGNOSIS — M797 Fibromyalgia: Principal | ICD-10-CM

## 2022-05-27 MED ORDER — DICLOFENAC 1 % TOPICAL GEL
Freq: Four times a day (QID) | TOPICAL | 0 refills | 13 days
Start: 2022-05-27 — End: 2023-05-27

## 2022-05-30 MED ORDER — DICLOFENAC 1 % TOPICAL GEL
Freq: Four times a day (QID) | TOPICAL | 0 refills | 13 days
Start: 2022-05-30 — End: 2023-05-30

## 2022-05-30 NOTE — Unmapped (Signed)
[facility] [place]  [address]                                 [place], [place] [address]    [phone]    This note is to let you know that [name] did not show for their scheduled Physical Therapy follow-up session. The patient cancelled their appointment less than 24 hours in advance, which is considered a no show per [facility] policy.     Made On:  Canceled: 02/11/2022 2:04 PM  05/29/2022 2:15 PM By:  By: Lendon Collar [[id number]] (ES)  EPIC, USER [1] (ES)   Cancel Rsn: Canceled via automated reminder system        Please contact me if you have any questions or concerns.    Thank you for this referral,     Signed: Cassell Smiles, PT  05/30/2022 7:54 AM

## 2022-06-02 ENCOUNTER — Ambulatory Visit: Admit: 2022-06-02 | Discharge: 2022-06-03 | Payer: PRIVATE HEALTH INSURANCE

## 2022-06-02 DIAGNOSIS — F418 Other specified anxiety disorders: Principal | ICD-10-CM

## 2022-06-02 DIAGNOSIS — Z794 Long term (current) use of insulin: Principal | ICD-10-CM

## 2022-06-02 DIAGNOSIS — M797 Fibromyalgia: Principal | ICD-10-CM

## 2022-06-02 DIAGNOSIS — L732 Hidradenitis suppurativa: Principal | ICD-10-CM

## 2022-06-02 DIAGNOSIS — I6523 Occlusion and stenosis of bilateral carotid arteries: Principal | ICD-10-CM

## 2022-06-02 DIAGNOSIS — E785 Hyperlipidemia, unspecified: Principal | ICD-10-CM

## 2022-06-02 DIAGNOSIS — E11319 Type 2 diabetes mellitus with unspecified diabetic retinopathy without macular edema: Principal | ICD-10-CM

## 2022-06-02 DIAGNOSIS — I10 Essential (primary) hypertension: Principal | ICD-10-CM

## 2022-06-02 DIAGNOSIS — R0789 Other chest pain: Principal | ICD-10-CM

## 2022-06-02 LAB — LIPID PANEL
CHOLESTEROL/HDL RATIO SCREEN: 3.6 (ref 1.0–4.5)
CHOLESTEROL: 107 mg/dL (ref <=200)
HDL CHOLESTEROL: 30 mg/dL — ABNORMAL LOW (ref 40–60)
LDL CHOLESTEROL CALCULATED: 51 mg/dL (ref 40–99)
NON-HDL CHOLESTEROL: 77 mg/dL (ref 70–130)
TRIGLYCERIDES: 128 mg/dL (ref 0–150)
VLDL CHOLESTEROL CAL: 25.6 mg/dL (ref 11–41)

## 2022-06-02 LAB — TSH: THYROID STIMULATING HORMONE: 2.114 u[IU]/mL (ref 0.550–4.780)

## 2022-06-02 LAB — HEMOGLOBIN A1C
ESTIMATED AVERAGE GLUCOSE: 151 mg/dL
HEMOGLOBIN A1C: 6.9 % — ABNORMAL HIGH (ref 4.8–5.6)

## 2022-06-02 MED ORDER — LOSARTAN 25 MG TABLET
ORAL_TABLET | Freq: Every day | ORAL | 3 refills | 90 days | Status: CP
Start: 2022-06-02 — End: ?
  Filled 2022-07-01: qty 90, 90d supply, fill #0

## 2022-06-02 MED ORDER — EMPAGLIFLOZIN 25 MG TABLET
ORAL_TABLET | Freq: Every day | ORAL | 3 refills | 90 days | Status: CP
Start: 2022-06-02 — End: ?
  Filled 2022-06-03: qty 90, 90d supply, fill #0

## 2022-06-02 NOTE — Unmapped (Signed)
Assessment and Plan:     Anne Barber was seen today for follow-up.    Diagnoses and all orders for this visit:    Type 2 diabetes mellitus with retinopathy, with long-term current use of insulin, macular edema presence unspecified, unspecified laterality, unspecified retinopathy severity (CMS-HCC)          -     Adherent with Metformin 1000 mg BID, 25 mg jaridance, atorvastatin, 50 U lantus, trulicity        -     No hypoglycemia, no adverse effects from medication        -     A1c 6.7--6.8--8.2        -     Yearly eye exams at [facility]  -     Hemoglobin A1c,TSH, lipid  -     losartan (COZAAR) 25 MG tablet; Take 1 tablet (25 mg total) by mouth daily.  -     empagliflozin (JARDIANCE) 25 mg tablet; Take 1 tablet (25 mg total) by mouth daily.    Bilateral carotid artery stenosis  Primary hypertension  Hyperlipidemia, unspecified hyperlipidemia type  Chest wall discomfort          -     Adherent with 81 mg ASA, 5 mg amlodipine, 25 mg losartan, 40 mg atorvastatin        -     LDL 66 (11/22), Cr 0.63.        -     EKG: NSR, slightly prolonged QT, no acute ST elevations/depressions        -     Seen in [facility] ER 03/03/22: EKG, CXR, troponin: negative        -     Appears to be chest wall pain, given chronicity and co-morbidities--referred to cardiology        -     Stress test (2019): Probably low risk study, normal systolic function  -     losartan (COZAAR) 25 MG tablet; Take 1 tablet (25 mg total) by mouth daily.    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   Chronic pain            -     Managed by rheumatology, pain clinic        -     Taking methotrexate, topical NSAIDs at night        -     Changing from Humira to infliximab 5 mg q8 weeks          -     Could not tolerate: lyrica, oxycodone, percocet        -     Does not recall if tramadol helpful for pain        -     Getting water PT, pt with significant joint pain/swelling      Depression with anxiety           - Taking 60 mg Cymbalta    Glaucoma, retinopathy: managed by [facility], appointments: 06/27/22 and 07/14/22  GERD: taking PPI    Mammogram due 5/24  Colonoscopy: 8/24  Pap due 2026    Barriers to recommended plan: None identified    Return if symptoms worsen or fail to improve, for 1. DR Nedra Hai     2. OV: 3-4 months .  Subjective:     HPI: Anne Barber is a 62 y.o. female here for Follow-up (Voices being in pain all week. /Has noticed right wrist is swollen/puffy. ).    Follow-up on chronic conditons:    Chest discomfort:  X 7 times per day, thinks have for past 7 weeks  Thinks triggered by cough, cannot take deep breath  Central part of left breast, sensation lasts seconds, resolves on own  No palpitations, no syncope, no pins/needles/weakness in B upper/lower extremities  Did have ER [facility] to [facility] 12/7/[facility]--EKG, Chest x-ray, troponins: negative    DM:  Metformin 1000 mg BID, 25 mg jaridance, atorvastatin, 50 U lantus, trulicity  Pt states blood sugars 'good' 100s-120s, no hypoglcyemia  A1c 6.7--6.8--8.2    HTN, high cholesterol:  Taking 40 mg atrovastatin, 81 mg ASA, 5 mg amlodipine, 25 mg losartan  Cr 0.63, LDL 66    Anxiety, depression: stable on 60 mg Cymbalta    Chronic pain, fibromyalgia, sero-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   Swelling in B wrist, pain in B ankles, neck  Missed therapy yesterday due to acute pain in lower back  Not able to tolerate lyrica, oxycodone, percocet--caused tiredness, memory fog  Uncertain if picked up/tried tramadol, sent by pain clinician in 12/23, per PDMP--pt filled  ADLs difficult  Taking methotrexate 20 mg po weekly, specialists planning to change from Humira to infliximab 5 mg q8 weeks    Topical NSAID gel  Managed by pain clinic, rheumatology    HS: managed by dermatology, using topical clindamycin, clobetasol, infliximab    Glaucoma, retinopathy: managed by [facility], upcoming appointments 06/27/22 and 07/14/22  Here with daughter    I have reviewed past medical, surgical, medications, allergies, social and family histories today and updated them in Epic where appropriate.    ROS:       Review of systems negative unless otherwise noted as per HPI.      Objective:     Vitals:    06/02/22 0903   BP: 120/72   Pulse: 94   Temp: 36.8 ??C (98.2 ??F)   SpO2: 98%     Body mass index is 34.04 kg/m??.    Physical Exam  Vitals and nursing note reviewed.   Constitutional:       Appearance: Normal appearance.   HENT:      Head: Normocephalic and atraumatic.   Cardiovascular:      Rate and Rhythm: Normal rate and regular rhythm.      Pulses: Normal pulses.      Heart sounds: Normal heart sounds.   Pulmonary:      Effort: Pulmonary effort is normal.      Breath sounds: Normal breath sounds.   Chest:      Comments: Chest wall--superior to left breast tender to palpation  Musculoskeletal:      Cervical back: Normal range of motion and neck supple.      Comments: Mild swelling noted in B wrists, elbows   Skin:     General: Skin is warm and dry.      Capillary Refill: Capillary refill takes less than 2 seconds.   Neurological:      General: No focal deficit present.      Mental Status: She is alert and oriented to person, place, and time. Mental status is at baseline.   Psychiatric:         Mood and Affect: Mood normal.         Behavior: Behavior normal.  Thought Content: Thought content normal.         Judgment: Judgment normal.            Medication adherence and barriers to the treatment plan have been addressed. Opportunities to optimize healthy behaviors have been discussed. Patient / caregiver voiced understanding.   I personally spent 40 minutes face-to-face and non-face-to-face in the care of this patient, which includes all pre, intra, and post visit time on the date of service.  Cleon Dew, DNP, FNP-C  [facility]  [phone] (F)    Note - This record has been created using [employer]. Chart creation errors have been sought, but may not always have been located. Such creation errors do not reflect on the standard of medical care.

## 2022-06-03 ENCOUNTER — Ambulatory Visit: Admit: 2022-06-03 | Discharge: 2022-06-04 | Payer: PRIVATE HEALTH INSURANCE

## 2022-06-03 DIAGNOSIS — I6523 Occlusion and stenosis of bilateral carotid arteries: Principal | ICD-10-CM

## 2022-06-03 DIAGNOSIS — M797 Fibromyalgia: Principal | ICD-10-CM

## 2022-06-03 DIAGNOSIS — E11319 Type 2 diabetes mellitus with unspecified diabetic retinopathy without macular edema: Principal | ICD-10-CM

## 2022-06-03 DIAGNOSIS — E785 Hyperlipidemia, unspecified: Principal | ICD-10-CM

## 2022-06-03 DIAGNOSIS — L732 Hidradenitis suppurativa: Principal | ICD-10-CM

## 2022-06-03 DIAGNOSIS — M1380 Other specified arthritis, unspecified site: Principal | ICD-10-CM

## 2022-06-03 DIAGNOSIS — R0789 Other chest pain: Principal | ICD-10-CM

## 2022-06-03 DIAGNOSIS — I10 Essential (primary) hypertension: Principal | ICD-10-CM

## 2022-06-03 MED ORDER — BUPRENORPHINE 5 MCG/HOUR WEEKLY TRANSDERMAL PATCH
MEDICATED_PATCH | TRANSDERMAL | 1 refills | 28 days | Status: CP
Start: 2022-06-03 — End: ?

## 2022-06-03 MED FILL — ASPIRIN 81 MG TABLET,DELAYED RELEASE: ORAL | 90 days supply | Qty: 90 | Fill #1

## 2022-06-03 MED FILL — DORZOLAMIDE 22.3 MG-TIMOLOL 6.8 MG/ML EYE DROPS: OPHTHALMIC | 50 days supply | Qty: 10 | Fill #1

## 2022-06-03 NOTE — Unmapped (Signed)
[facility] [place]  OUTPATIENT PHYSICAL THERAPY  06/03/2022  Note Type: Discharge Note       Patient Name: Anne Barber  Date of Birth:1960-09-23  Diagnosis:   Encounter Diagnoses   Name Primary?    Chronic bilateral low back pain without sciatica Yes    Difficulty walking     Fibromyalgia      Referring Provider: Caffie Pinto     Date of Onset of Impairment: 05/14/2021  Date PT Care Plan Established or Reviewed: 02/11/2022  Date PT Treatment Started: 02/11/2022     Session Number:  5/27    ASSESSMENT & PLAN   Assessment  Assessment details:    Breannia has completed 5 PT sessions for chronic low back pain and multi joint pain including: neck, hip, knee, ankle, elbow and hand. She did benefit from aquatic therapy, but at this time, due to severe pain and fatigue, she requests to take a break from outpatient PT. As such, she will discharge from formal PT today. No goals met. She will continue her home exercise program and will continue to look into joining a pool close to home to continue her aquatic exercise program also. Aarian is discharged from skilled Physical Therapy today with an independent home exercise program to maintain functional gains and maximize her safety.           Impairments: decreased endurance, pain, decreased strength, decreased range of motion, impaired motor control, gait deviation, fall risk, impaired balance, impaired coordination, impaired flexibility, impaired ADLs, decreased mobility, core weakness and obesity    Other impairment: low vision, hearing impairment    Personal Factors/Comorbidities: 3+    Specific Comorbidities: HTN, DM2, chronic pain, fibromyalgia, depression with aniexty, impaired vision, multijoint pain    Examination of Body Systems: musculoskeletal, activity/participation and communication    Clinical Presentation: evolving    Clinical Decision Making: moderate    Prognosis: good prognosis    Positive Prognosis Rationale: caregiver/family support.  Negative Prognosis Rationale: medical status/condition, chronicity of condition and severity of symptoms.      Therapy Goals      Goals:        1. In 12 weeks the patient will demonstrate independent performance of HEP to maintain functional gains. - NOT MET 06/03/22  2. In 12 weeks the patient will complete an additional 3 STS transfers during the 30s chair stand test to met the MDC and to demonstrate increased functional leg strength. - NOT MET 06/03/22  3. In 12 weeks the patient will score a 10 point change on the ODI to demonstrate the MDC (0-100%; lower score indicates a lesser level of disability) and to indicate improved activity tolerance. - NOT MET 06/03/22        Plan    Therapy options: will be seen for skilled physical therapy services    Planned therapy interventions: Aquatic Therapy, Location manager, Education - Patient, Endurance Activites, Functional Mobility, Investment banker, operational, Home Exercise Program, Manual Therapy, Neuromuscular Re-education, Therapeutic Activities, Therapeutic Exercises, Education - Family/Caregiver, Dry Needling, E-Stim, TENS, Self-Care/Home Training, Civil engineer, contracting and Diaphragmatic/Pursed-lip Breathing    DME Equipment: [employer].    Frequency: biweekly.    Duration in weeks: 12    Education provided to: patient and family.    Education provided: HEP, Treatment options and plan, Symptom management, Safety education, Importance of Therapy, Anatomy, Body mechanics, Role of therapy in Rehabilitation, Posture, Community resources and Body awareness    Education results: verbalized good understanding, demonstrates understanding and  needs reinforcement.    Communication/Consultation: N/A.              SUBJECTIVE   Interpreter Use: [employer] Video Interpreter    History of Present Condition      History of Present Condition/Chief Complaint:       Multi joint pain, Fibromyalgia [M79.7]  - Primary  Reason for Exam  Comments: Water therapy        Subjective:     I am tired all the time. I have no motivation to do things at time, I am sad and in a lot of pain. So much pain. Pain in my right wrist is the worst today. I do the exercises. Might join [employer] close to home. I would like to take a break.     Pain  Current pain rating: 10  At best pain rating: 10  At worst pain rating: 10  Location: left sided lumbar spine, right knee, right ankle  Quality: aching  Relieving factors: medications  Aggravating factors: on the move and walking  Pain Related Behaviors: crying  Progression: no change  Red flags: none.    Precautions and Equipment  Precautions: None  Current Braces/Orthoses: None  Equipment Currently Used: Rolling walker  Prior Functional Status:     Functional Limitation(s)-No physical limitations  Current functional status: use of assistive device, unsteady gait, limited sitting tolerance, limited household activities, limited bending, difficulty with transfers, limited exercise, limited lifting, limited standing tolerance and limited walking tolerance  Social Support    Communication Preference: verbal and written  Barriers to Learning: hearing    Diagnostic Tests    Diagnostic Test Comments:       See EMR    Treatments  Previous treatment: home health PT      Patient Goals  Patient goals for therapy: decreased pain, improved ambulation and improved balance        OBJECTIVE     General Observations  Pleasant female, NAD. Ambulates with rolling walker    Posture  Sitting: slouched  Standing: reduced lumbar lordosis and pain posturing       Range of Motion - Lumbar Spine     No ROM Loss  (0%) Min ROM Loss  (1-25%) Mod ROM Loss  (26-65%) Major ROM Loss  (66-100%)   Symptoms    Flexion    x Increased pain   Extension    x Increased pain   Side Bend Right   x  Increased pain   Side Bend Left   x  Increased pain   Sideglide Right   x  Increased pain   Sideglide Left   x  Increased pain   Observable movement coordination impairments of the lumbopelvic region with flexion and extension movements or while performing daily physical activities:  YES     Denies Sensory Changes        Transfers  Sit to stand: mod independent with increased time     Gait  Very antalgic gait. Decreased speed. Shuffle pattern.     Written Outcome Measures  Oswestry Score: 37 / 50 or 74 %   A score of 0-20% indicates minimal disability. 21-40% indicates moderate disability. 41-60% indicates severe disability. 61-80% indicates crippling disability. 81-100% indicates the patient may be bed bound and careful evaluation is recommended. The Minimal Detectable Change (MDC) is 10 points.     Functional Outcome Measures  30s STS: 2 reps with a lot of pain    TUG: 45 s  TREATMENT RENDERED     Therapeutic Exercise: 45 Minutes   Performed with direct PT demonstration, instruction, supervision, and guidance.   - reassessment of symptoms   - review of HEP - recommend to continue  (LAQ, ankle pumps, marches, walking)  - aquatic PT HEP review   - discharge and POC discussion   - Education and discussion on shared goal setting, identification and mitigation of barriers to compliance, and other factors that influence the patient???s knowledge, skills, beliefs, and attitudes toward home program including therapeutic exercise and/or neuromuscular re-education. Communication to improve these intrinsic factors and facilitate return to PLOF.           Total Treatment Time: 45 Minutes        Therapeutic Interventions Charges  $$ Therapeutic Exercise [mins]: 45                 I attest that I have reviewed the above information.  Signed: Barron Alvine, PT, DPT  06/03/2022 3:06 PM        I reviewed the no-show/attendance policy with the patient and caregiver(s). The patient is aware that they must call to cancel appointments more than 24 hours in advance. They are also aware that if they late cancel or no-show three times, we reserve the right to cancel their remaining appointments. This policy is in place to allow [place] to best serve the needs of our caseload.    If patient returns to clinic with variance in plan of care, then it may be attributable to one or more of the following factors: preferred clinician availability, appointment time request availability, therapy pool appointment availability, major holiday with clinic closure, caregiver availability, patient transportation, conflicting medical appointment, inclement weather, and/or patient illness.    If patient does not return for follow up visit(s) related to this episode of care, this note will serve as their discharge note from Physical Therapy.

## 2022-06-03 NOTE — Unmapped (Signed)
STOP tramadol  START butrans patch 5 mcg/hr  Referral to rheumatology placed

## 2022-06-03 NOTE — Unmapped (Signed)
Spanish interpreter Pray, HM#[id number]

## 2022-06-03 NOTE — Unmapped (Signed)
Medication:raMADoL (ULTRAM) 50 mg tablet   SIG:1/2 tab twice daily as needed   Quantity on RX: #30  Filled on:  Pill count today: # Patient states she did not pick up med at pharmacy/pharmacy stated they did not have prescription

## 2022-06-03 NOTE — Unmapped (Signed)
Department of Anesthesiology  [facility]  [address]  [place], [place] [address]  [phone]    Chronic Pain Follow-Up Note    Assessment and Plan  Attending: [name] is a 62 y.o. female with a PMHx significant for DM. [facility].    Per chart review, the patient was referred by Dermatology to our clinic as well as rheumatology regarding inflammatory lesions on her hands, muscle pain and weakness. The patient was seen by an outside rheumatologist at [facility] with ddx of PMR vs Fibromyalgia, she was also started on Humira but reports no longer being seen by rheumatology despite complaints of persistent diffuse joint pain. Patient does have a history of glaucoma and is unable to take steroids per daughter per ophthalmologist.    She presents today with right shoulder pain, right arm pain, bilateral hand pain, left hand to elbow pain, bilateral knee pain, bilateral ankle pain, right toe pain, left lower back pain, and left neck pain.    Arthralgia, unspecified joint/ Fibromyalgia   Patient presents with continued diffuse muscle and joint pain with primary focus to forearms and hands. Carries prior dx of  Fibromyalgia although today she is presenting with clear evidence of hand swelling at MCP. Her pain changes in severity and location every couple of days which is consistent with nature of Fibromyalgia pain. Upon exam, there is diffuse tenderness to palpation and hyperalgesia to light touch to the muscle tissue and tenderness on joint evaluation. Pain impacts her daily activities and limits functionality significantly. We encouraged exercises and movement given diagnosis of Fibromyalgia.    The patient presents in a f/up regarding changes in medication regimen. At the last visit she has endorses side effects upon escalation of Lyrica from 25 to 50 mg at night - she reported brain fog, drowsiness, auditory hallucinations and feeling out of it thus Lyrica has been stopped. Pt also reported excessive sedation and sleepiness with Percocet 5/35 even when using 1/2 tab po thus we have initiated tramadol but the patient did not report much benefit from it.  She continues to use Cymbalta, ibuprofen OTC prn and topical voltaren gel. She is interested in transitioning to another opioid medication hoping for better pain management with concomitant decrease in side effect profile thus we will stop tramadol and start butrans 5 mcg/hr. The patient will return for reassessment in 1 to 2 months.       General Recommendations: The pain condition that the patient suffers from is best treated with a multidisciplinary approach that involves an increase in physical activity to prevent de-conditioning and worsening of the pain cycle, as well as psychological counseling (formal and/or informal) to address the co-morbid psychological affects of pain.  Treatment will often involve judicious use of pain medications and interventional procedures to decrease the pain, allowing the patient to participate in the physical activity that will ultimately produce long-lasting pain reductions.  The goal of the multidisciplinary approach is to return the patient to a higher level of overall function and to restore their ability to perform activities of daily living.      HPI:  Anne Barber is seen in consultation at the request of Marylene Land,*  For evaluation and recommendations regarding Her chronic pain.     At last visit in December, the patient was provided with Lyrica and Percocet, however was not able to tolerate it. The patient was  initiated on Lyrica 25 mg at night but after increasing it to 2 tabs per night reported having auditory hallucinations and felt out of it. Pt also reported excessive sedation and sleepiness with Percocet 5/35 even when using 1/2 tab po. The patient reported not being able to tolerate Lyrica and Percocet and requested medication change.     Since last visit, the patient has followed with PT, Dermatology, Ophthalmology, Rheumatology, and Family Medicine. The patient presented to the ED on 03/03/22 for complaint of chest pain.     Today, the patient and her daughter present reporting poorly controlled pain. Patient's pain is graded as severe and mostly localized to various joints but worst in upper extremities in hands, wrists and elbows. She also reports tingling diffusely in fingers. She reports severe pain in shoulders few days back which has now abated. The pain appears to oscillate and migrate. The patient reports that cymbalta helps her with sleep but she is not sure if it has any analgesic benefits. She reports no benefit from tramadol and reports sedation at higher dose (50 mg). She also uses ibuprofen 200 mg usually at 400 mg bid prn and denies any associated side effects. She is interested in transitioning to another opioid medication hoping for better pain management with concomitant decrease in side effect profile.       Pain Clinic? No      Previous interventions include Medications  Workmans Compensation is not involved.  This is not involved with a lawsuit or lawyer  The treatment goals include Complete resolution with medications if necessary   Current view: Showing all answers              Previous Responses        Op Gct Opt Outs       Question 04/22/2022  2:01 PM EST - Filed by Patient Representative (Daughter)    Would you like to be excluded from the patient list? Yes    Would you like for [place] to withhold information about you from your family and friends? Yes    Would you like for [place] to withhold information about you from community clergy? Yes           Previous Medication Trials: duloxetine/cymbalta, fluoxetine, and ibuprofen    IMAGING:    CT 02/19/18  There are degenerative changes at the atlantoaxial joint. Prominent hypertrophic osteophytosis is present anteriorly at C2-3. Hypertrophic facet changes are particularly prominent at C3-4 through C6-7, left greater than right. There is a area of linear radiolucency most suggestive of a partially healed old fracture of the right lamina of C3   Allergies as of 06/03/2022 - Reviewed 06/03/2022   Allergen Reaction Noted    Other  12/27/2021      Current Outpatient Medications   Medication Sig Dispense Refill    ADALIMUMAB PEN CITRATE FREE 40 MG/0.4 ML Inject 0.4 mL (40 mg total) under the skin every seven (7) days. 4 each 11    amlodipine (NORVASC) 5 MG tablet Take 1 tablet (5 mg total) by mouth daily. 90 tablet 3    aspirin (ECOTRIN) 81 MG tablet Take 1 tablet (81 mg total) by mouth daily. 90 tablet 3    atorvastatin (LIPITOR) 40 MG tablet Take 1 tablet (40 mg total) by mouth daily. 90 tablet 3    BD INSULIN SYRINGE ULT-FINE II 1 mL 31 gauge x 5/16 Syrg 1 Package by Miscellaneous route Two (2) times a day. 100 each 3  benzonatate (TESSALON) 100 MG capsule       blood sugar diagnostic (ONETOUCH ULTRA BLUE TEST STRIP) Strp USE TO CHECK BLOOD SUGAR UP TO 10 TIMES DAILY AS NEEDED 600 strip 1    cetirizine (ZYRTEC) 10 MG tablet Take 1 tablet (10 mg total) by mouth daily. 90 tablet 1    clindamycin (CLEOCIN T) 1 % lotion Apply topically every morning. 60 mL 5    clindamycin (CLEOCIN T) 1 % Swab Apply 1 application topically Two (2) times a day. 60 each 11    clobetasol (TEMOVATE) 0.05 % ointment Apply topically two (2) times a day. To lesion in groin 30 g 1    dexamethasone (DECADRON) 0.1 % ophthalmic solution Administer 4 drops into the left ear Two (2) times a day for 30 days 5 mL 0    dextran 70-hypromellose, PF, (TEARS NATURAL FREE) 0.1-0.3 % Dpet       diclofenac sodium (VOLTAREN) 1 % gel Apply 2 g topically four (4) times a day. 100 g 0    dulaglutide (TRULICITY) 0.75 mg/0.5 mL injection pen Inject 0.5 mL (0.75 mg total) under the skin every seven (7) days. 6 mL 1    DULoxetine (CYMBALTA) 60 MG capsule Take 1 capsule (60 mg total) by mouth daily. 90 capsule 1    empagliflozin (JARDIANCE) 25 mg tablet Take 1 tablet (25 mg total) by mouth daily. 90 tablet 3    empty container Misc Use as directed to dispose of Humira pens. 1 each 2    flash glucose sensor (FREESTYLE LIBRE 2 SENSOR) kit Use to monitor blood glucose levels continuously. Change sensor every 10 days 3 each 11    folic acid (FOLVITE) 1 MG tablet Take 1 tablet (1 mg total) by mouth daily. 30 tablet 3    folic acid/multivit-min/lutein (CENTRUM SILVER ORAL) Take 1 tablet by mouth daily at 0600.       insulin glargine (BASAGLAR, LANTUS) 100 unit/mL (3 mL) injection pen Inject 0.5 mL (50 Units total) under the skin nightly. 45 mL 1    losartan (COZAAR) 25 MG tablet Take 1 tablet (25 mg total) by mouth daily. 90 tablet 3    metFORMIN (GLUCOPHAGE) 1000 MG tablet Take 1 tablet (1,000 mg total) by mouth 2 (two) times a day with meals. 180 tablet 3    methotrexate 2.5 MG tablet Take 8 tablets (20 mg total) by mouth once a week. 96 tablet 0    NON FORMULARY Low dose Naltrexone 4.5 mg PO daily 30 each 1    omeprazole (PRILOSEC) 20 MG capsule Take 1 capsule (20 mg total) by mouth daily. 90 capsule 3    pen needle, diabetic (ULTICARE PEN NEEDLE) 32 gauge x 5/32 (4 mm) Ndle Use as directed two (2) times a day 100 each 12    polyethylene glycol (GLYCOLAX) 17 gram/dose powder Take 17 g by mouth daily. 238 g 11    predniSONE (DELTASONE) 10 MG tablet       predniSONE (DELTASONE) 20 MG tablet Take 1 tablet (20 mg total) by mouth daily. 30 tablet 0    traZODone (DESYREL) 50 MG tablet Take 1 tablet (50 mg total) by mouth nightly. 90 tablet 3    blood-glucose meter kit Use as instructed 1 each 0    buprenorphine 5 mcg/hour PTWK transdermal patch Place 1 patch on the skin every seven (7) days. 4 patch 1    dorzolamide-timolol (COSOPT) 22.3-6.8 mg/mL ophthalmic solution Administer 1 drop to both eyes two (  2) times a day. 10 mL 12    fluticasone propionate (FLONASE) 50 mcg/actuation nasal spray 1 spray into each nostril two (2) times a day. 48 g 1    lancing device Misc USE 4 TIMES DAILY BEFORE MEALS AND NIGHTLY ( USAR CUATRO VECES AL DIA SPBM Y TODAS LAS NOCHES ) 1 each 1     No current facility-administered medications for this visit.     Facility-Administered Medications Ordered in Other Visits   Medication Dose Route Frequency Provider Last Rate Last Admin    matrix hemostatic sealant (FLOSEAL) topical             matrix hemostatic sealant (FLOSEAL) topical                Lab Results   Component Value Date    CREATININE 0.63 04/22/2022       Lab Results   Component Value Date    ALKPHOS 128 (H) 12/27/2021    BILITOT 0.2 (L) 12/27/2021    PROT 7.5 12/27/2021    ALBUMIN 3.3 (L) 04/22/2022    ALT 9 (L) 04/22/2022    AST 21 04/22/2022       Lab Results   Component Value Date    PLT 237 04/22/2022         Urine toxiciology screen  No results found for: AMPHU, BARBU, BENZU, CANNAU, METHU, OPIAU, COCAU    OPIOID CONFIRMATION:  No results found for: CDIFFTOX, NBUPR, LABCO, HYDROCODONE, HYDROMORPH, MORPHINE, OXYCODONE, OXMU, MAMU, OPIU     BENZODIAZEPINE CONFIRMATION:  No results found for: CDIFFTOX, OHALP, CLONU, HDXYFLRAZUR, 7NHFLU, DESALKYCONF, LORAZURQT, HDXYTRIAZUR, MIDAZURQT, BNZU    Review of Systems:  See questionnaire above       PHYSICAL EXAM:  BP 102/57  - Pulse 87  - Temp 36.5 ??C (97.7 ??F) (Skin)  - Resp 16  - Ht 154.9 cm (5' 1)  - Wt 78.9 kg (174 lb)  - SpO2 98%  - BMI 32.88 kg/m??   Wt Readings from Last 3 Encounters:   06/03/22 78.9 kg (174 lb)   06/02/22 79.1 kg (174 lb 4.8 oz)   04/22/22 80.7 kg (178 lb)     GENERAL:  The patient is well developed, obese female, patient is spanish speaking, she appears to be in no apparent distress.   HEAD/NECK:    Normocephalic/atraumatic, EOMI  CV:  RR  LUNGS:   Normal work of breathing, no supplemental O2  EXTREMITIES:  No clubbing, cyanosis noted.  Well perfused  NEUROLOGIC:    The patient is alert and oriented, speech fluent, normal language.   MUSCULOSKELETAL:    Motor function  preserved. Tenderness to palpation of wrists, MCPs and forearms  No clear evidence of synovitis  GAIT:  Wide based, nonantalgic.   SKIN:   No obvious rashes, lesions, or erythema.  PSY:   Appropriate affect. No overt pain behaviors. No evidence of psychomotor retardation or agitation, no signs of intoxication.

## 2022-06-10 NOTE — Unmapped (Signed)
Anne Barber has been contacted in regards to their refill of HUMIRA. At this time, they have declined refill due to  daughter [name]) stated was advised by MD to stop Humira until further notice. Has appt with MD today (3/15) . Refill assessment call date has been updated per the patient's request.

## 2022-06-24 NOTE — Unmapped (Signed)
error 

## 2022-06-27 ENCOUNTER — Ambulatory Visit
Admit: 2022-06-27 | Discharge: 2022-06-28 | Payer: PRIVATE HEALTH INSURANCE | Attending: Student in an Organized Health Care Education/Training Program | Primary: Student in an Organized Health Care Education/Training Program

## 2022-06-27 MED ORDER — DORZOLAMIDE 22.3 MG-TIMOLOL 6.8 MG/ML EYE DROPS
Freq: Two times a day (BID) | OPHTHALMIC | 12 refills | 100 days | Status: CP
Start: 2022-06-27 — End: ?
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
## 2022-06-28 NOTE — Unmapped (Unsigned)
[facility] Refill Coordination Note    Specialty Medication(s) to be Shipped:   Inflammatory Disorders: {sdlinflammatory:59098}    Other medication(s) to be shipped: {Blank:19197::***,No additional medications requested for fill at this time}     Anne Barber, DOB: Apr 13, 1960  Phone: [phone] (home)       All above HIPAA information was verified with {Blank:19197::patient.,patient's caregiver, ***,patient's family member, ***.}     Was a Nurse, learning disability used for this call? {Blank single:19197::Yes, ***. Patient language is appropriate in WAM,No}    Completed refill call assessment today to schedule patient's medication shipment from the [facility] [phone]).  All relevant notes have been reviewed.     Specialty medication(s) and dose(s) confirmed: {Blank:19197::Regimen is correct and unchanged.,Patient reports changes to the regimen as follows: ***}   Changes to medications: {Blank:19197::Anne Barber reports starting the following medications: ***,Anne Barber reports stopping the following medications: ***,Anne Barber reports no changes at this time.}  Changes to insurance: {Blank:19197::Yes: ***,No}  New side effects reported not previously addressed with a pharmacist or physician: {sscrefillsideeffects:78475}  Questions for the pharmacist: {Blank:19197::Yes: ***,No}    Confirmed patient received a Conservation officer, historic buildings and a Surveyor, mining with first shipment. The patient will receive a drug information handout for each medication shipped and additional FDA Medication Guides as required.       DISEASE/MEDICATION-SPECIFIC INFORMATION        {clinicspecificinstructions:59274}    SPECIALTY MEDICATION ADHERENCE     Medication Adherence    Patient reported X missed doses in the last month: 0  Specialty Medication: insulin glargine 100 unit/mL (3 mL) injection pen (BASAGLAR, LANTUS)  Patient is on additional specialty medications: No  Patient is on more than two specialty medications: No  Any gaps in refill history greater than 2 weeks in the last 3 months: no  Demonstrates understanding of importance of adherence: yes              Were doses missed due to medication being on hold? {Blank:19197::No,Yes - ***}    *** *** {Blank:19197::mg,mg/ml,***}: *** days of medicine on hand   *** *** {Blank:19197::mg,mg/ml,***}: *** days of medicine on hand   *** *** {Blank:19197::mg,mg/ml,***}: *** days of medicine on hand   *** *** {Blank:19197::mg,mg/ml,***}: *** days of medicine on hand   *** *** {Blank:19197::mg,mg/ml,***}: *** days of medicine on hand     REFERRAL TO PHARMACIST     Referral to the pharmacist: {SSCRefertoRPH:77899}      SHIPPING     Shipping address confirmed in Epic.     Patient was notified of new phone menu : {Blank:19197::Yes,No}    Delivery Scheduled: {Blank:19197::Yes, Expected medication delivery date: ***.,Yes, Expected medication delivery date: ***.  However, Rx request for refills was sent to the provider as there are none remaining.,Patient declined refill at this time due to ***.,No, cannot schedule delivery at this time as there are outstanding items that need addressed.  This note has been handed off to the provider for follow up.,Due to patient insurance changes, unable to fill at [facility], please route Rx to *** specialty pharmacy}     Medication will be delivered via {Blank:19197::UPS,Next Day Courier,Same Day Courier,Clinic Courier - *** clinic,***} to the {Blank:19197::prescription,temporary} address in Epic WAM.    Anne Barber   [facility] Specialty {Blank:19197::Pharmacist,Technician}

## 2022-06-28 NOTE — Unmapped (Signed)
I agree with the resident and have made myself available to the resident to discuss the patient's findings, assessment, and plan.    Arsenio Loader, MD  Glaucoma Fellow, Instructor in Ophthalmology  06/28/22

## 2022-07-01 DIAGNOSIS — M1380 Other specified arthritis, unspecified site: Principal | ICD-10-CM

## 2022-07-01 MED FILL — LANTUS SOLOSTAR U-100 INSULIN 100 UNIT/ML (3 ML) SUBCUTANEOUS PEN: SUBCUTANEOUS | 90 days supply | Qty: 45 | Fill #1

## 2022-07-01 MED FILL — CETIRIZINE 10 MG TABLET: ORAL | 30 days supply | Qty: 30 | Fill #4

## 2022-07-01 MED FILL — ATORVASTATIN 40 MG TABLET: ORAL | 90 days supply | Qty: 90 | Fill #2

## 2022-07-01 MED FILL — OMEPRAZOLE 20 MG CAPSULE,DELAYED RELEASE: ORAL | 90 days supply | Qty: 90 | Fill #1

## 2022-07-07 NOTE — Unmapped (Signed)
[facility] at [facility]  [address], [place], [place] [address]   Phone: [phone]  Fax: [phone]    Date of Service: 07/08/2022    Patient Clinic Note    PCP: Referring Provider:   Deneise Lever, FNP  [address] [place] [address]  Phone: [phone]  Fax: [phone] Deneise Lever, FNP  [address]  [place],  [place] [address]  Phone: [phone]  Fax: [phone]       Assessment and Plan:     Ms. Margheim is a 62 y.o. female with past medical history of DMT2, mild non obstructive bilateral carotid artery disease, coronary artery calcification noted on CT chest, hyperlipidemia, mild aortic regurgitation who presents for cardiac care.     Echo , repeat nuc . Cor ca moderate      Recent ED visit [facility]       ***      Lab Results   Component Value Date    CHOL 107 06/02/2022    CHOL 151 01/29/2021    CHOL 175 02/04/2020     Lab Results   Component Value Date    HDL 30 (L) 06/02/2022    HDL 40 01/29/2021    HDL 42 02/04/2020     Lab Results   Component Value Date    LDL 51 06/02/2022    LDL 66 01/29/2021    LDL 96 02/04/2020     Lab Results   Component Value Date    VLDL 25.6 06/02/2022    VLDL 45.2 (H) 01/29/2021    VLDL 36.6 02/04/2020     Lab Results   Component Value Date    CHOLHDLRATIO 3.6 06/02/2022    CHOLHDLRATIO 3.8 01/29/2021    CHOLHDLRATIO 4.2 02/04/2020     Lab Results   Component Value Date    TRIG 128 06/02/2022    TRIG 226 (H) 01/29/2021    TRIG 183 (H) 02/04/2020       The 10-yr ASCVD Risk score Denman George DC Jr., et al., 2013) failed to calculate due to the following reason:  The 2013 10-yr ASCVD risk score is only valid if the patient does not have prior/existing clinical ASCVD (myocardial infarction, stroke, CABG, coronary angioplasty, angina or peripheral arterial disease, coronary atherosclerosis, ischemic heart disease, or cerebrovascular disease). The patient has prior/existing ASCVD.  Has Cerebrovascular Disease      Lab Results   Component Value Date    A1C 6.9 (H) 06/02/2022 No follow-ups on file.          Subjective:     Chief Complaint:  No chief complaint on file.        Referring Provider: Alta Corning*    History of Present Illness:     Ms. Weith is a 62 y.o. female, with past medical history of ***          Cardiovascular History & Procedures:  Cardiovascular Problems:  ***    Cath / PCI:  ***    CV Surgery:   ***    EP Procedures and Devices:  ***    Non-Invasive Evaluation(s): independently reviewed the most recent study.   Echocardiogram 2019- LVEF 55%, mild AI , normal RV size and fxn   SPECT 2019 - likely normal perfusion, low risk, coronary artery calcium     Medical History:  Past Medical History:   Diagnosis Date    Anisocoria     Cataract  Nuclear and Mild Cortical Cataracts, OU    Depression     Diabetes mellitus (CMS-HCC) Dx 1996    Type II    Diabetic retinopathy (CMS-HCC)     PDR OU and mild DME avastin injections x6 right eye  and left eye x1,PRP 2015    Dry eyes     Fractures     Glaucoma     History of Neovascular Glaucoma OD, indeterminate stage    Hypertension     PONV (postoperative nausea and vomiting)     Shoulder injury     Tuberculosis 1978    completed one year of treatment.        Surgical History:  Past Surgical History:   Procedure Laterality Date    ANKLE FRACTURE SURGERY      CATARACT EXTRACTION EXTRACAPSULAR W/ INTRAOCULAR LENS IMPLANTATION Left 10/10/2019    HAND SURGERY      PR AQUEOUS SHUNT EXTRAOC EQUAT PLATE RSVR W/GRAFT Right 10/12/2013    Procedure: AQUEOUS SHUNT-EXTRAOCULAR RESERVOIR;  Surgeon: Leda Roys, MD;  Location: MAIN OR [facility];  Service: Ophthalmology    PR COLONOSCOPY W/BIOPSY SINGLE/MULTIPLE Left 11/16/2012    Procedure: COLONOSCOPY, FLEXIBLE, PROXIMAL TO SPLENIC FLEXURE; WITH BIOPSY, SINGLE OR MULTIPLE;  Surgeon: Lauretta Grill, MD;  Location: [facility] PROCEDURES [facility];  Service: Gastroenterology    PR DRAIN ANT [facility] BLOOD Right 10/12/2013    Procedure: PARACENTESIS (SEPART PROC); W/REMOV BLD;  Surgeon: Leda Roys, MD;  Location: MAIN OR [facility];  Service: Ophthalmology    PR EYE SURG POST SGMT PROC UNLISTED Left 08/10/2016    PPV/EL/AIR left eye for [facility]    PR OPEN RX STERNUM FRACTURE N/A 03/01/2018    Procedure: OPEN TX STERNUM FX W/WO SKELETAL FIXA;  Surgeon: Cherie Dark, MD;  Location: MAIN OR [facility];  Service: Thoracic    PR REINFORCE SCLERA EYE W GRAFT Right 10/12/2013    Procedure: SCLERAL REINFORCEMENT (SEPART PROC); W/GFT;  Surgeon: Leda Roys, MD;  Location: MAIN OR [facility];  Service: Ophthalmology    PR UPPER GI ENDOSCOPY,BIOPSY N/A 11/16/2012    Procedure: UGI ENDOSCOPY; WITH BIOPSY, SINGLE OR MULTIPLE;  Surgeon: Lauretta Grill, MD;  [facility]: [facility];  Service: Gastroenterology    PR UPPER GI ENDOSCOPY,BIOPSY N/A 05/08/2013    Procedure: UGI ENDOSCOPY; WITH BIOPSY, SINGLE OR MULTIPLE;  Surgeon: Clint Bolder, MD;  [facility];  Service: Gastroenterology    PR VITRECTOMY,PANRETINAL LASER RX Left 09/24/2014    Procedure: VITRECTOMY, MECHANICAL, PARS PLANA APPROACH; WITH ENDOLASER PANRETINAL PHOTOCOAGULATION;  Surgeon: Rockwell Alexandria, MD;  Location: [facility] OR [facility];  Service: Ophthalmology    PR VITRECTOMY,PANRETINAL LASER RX Left 08/10/2016    Procedure: VITRECTOMY, MECHANICAL, PARS PLANA APPROACH; WITH ENDOLASER PANRETINAL PHOTOCOAGULATION;  Surgeon: Nicholaus Corolla, MD;  [facility]: [facility];  Service: Ophthalmology    PR Hessie Knows LASER RX Right 06/26/2019    Procedure: VITRECTOMY, MECHANICAL, PARS PLANA APPROACH; WITH ENDOLASER PANRETINAL PHOTOCOAGULATION;  Surgeon: Nicholaus Corolla, MD;  Location: [facility] OR [facility];  Service: Ophthalmology    PR XCAPSL CTRC RMVL INSJ IO LENS PROSTH W/O ECP Right 09/26/2019    Procedure: EXTRACAPSULAR CATARACT REMOVAL W/INSERTION OF INTRAOCULAR LENS PROSTHESIS, MANUAL OR MECHANICAL TECHNIQUE WITHOUT ENDOSCOPIC CYCLOPHOTOCOAGULATION;  Surgeon: Arville Care, MD;  Location: [facility] OR [facility];  Service: Ophthalmology    PR XCAPSL CTRC RMVL INSJ IO LENS PROSTH W/O ECP Left 10/10/2019    Procedure: EXTRACAPSULAR CATARACT REMOVAL  W/INSERTION OF INTRAOCULAR LENS PROSTHESIS, MANUAL OR MECHANICAL TECHNIQUE WITHOUT ENDOSCOPIC CYCLOPHOTOCOAGULATION;  Surgeon: Arville Care, MD;  Location: [facility] OR [facility];  Service: Ophthalmology    SHOULDER SURGERY  2016    right    TUBAL LIGATION      YAG CAPSULOTOMY - OU - BOTH EYES Bilateral 11/08/2019       Social History:   reports that she has never smoked. She has never used smokeless tobacco. She reports that she does not drink alcohol and does not use drugs.    Family History:  family history includes Diabetes in her brother, grandchild, and sister; No Known Problems in her daughter, daughter, father, maternal aunt, maternal grandfather, maternal grandmother, maternal uncle, mother, paternal aunt, paternal grandmother, paternal uncle, and another family member; Psoriasis in her paternal grandfather.    Review of Systems:   Except as noted in the HPI, the remainder of 10 systems reviewed is negative.     Allergies:  Allergies   Allergen Reactions    Other      steroids       Medications:   Prior to Admission medications    Medication Dose, Route, Frequency   ADALIMUMAB PEN CITRATE FREE 40 MG/0.4 ML 40 mg, Subcutaneous, Every 7 days   amlodipine (NORVASC) 5 MG tablet 5 mg, Oral, Daily (standard)   aspirin (ECOTRIN) 81 MG tablet 81 mg, Oral, Daily (standard)   atorvastatin (LIPITOR) 40 MG tablet 40 mg, Oral, Daily (standard)   BD INSULIN SYRINGE ULT-FINE II 1 mL 31 gauge x 5/16 Syrg 1 Package, Miscellaneous, 2 times a day   benzonatate (TESSALON) 100 MG capsule    blood sugar diagnostic (ONETOUCH ULTRA BLUE TEST STRIP) Strp USE TO CHECK BLOOD SUGAR UP TO 10 TIMES DAILY AS NEEDED   blood-glucose meter kit Use as instructed   buprenorphine 5 mcg/hour PTWK transdermal patch 1 patch, Transdermal, Every 7 days   cetirizine (ZYRTEC) 10 MG tablet 10 mg, Oral, Daily (standard) clindamycin (CLEOCIN T) 1 % lotion Topical, Every morning   clindamycin (CLEOCIN T) 1 % Swab Apply 1 application topically Two (2) times a day.   clobetasol (TEMOVATE) 0.05 % ointment Topical, 2 times a day (standard), To lesion in groin   dexamethasone (DECADRON) 0.1 % ophthalmic solution Administer 4 drops into the left ear Two (2) times a day for 30 days   dextran 70-hypromellose, PF, (TEARS NATURAL FREE) 0.1-0.3 % Dpet No dose, route, or frequency recorded.   diclofenac sodium (VOLTAREN) 1 % gel 2 g, Topical, 4 times a day   dorzolamide-timolol (COSOPT) 22.3-6.8 mg/mL ophthalmic solution 1 drop, Both Eyes, 2 times a day (standard)   dulaglutide (TRULICITY) 0.75 mg/0.5 mL injection pen 0.75 mg, Subcutaneous, Every 7 days   DULoxetine (CYMBALTA) 60 MG capsule 60 mg, Oral, Daily (standard)   empagliflozin (JARDIANCE) 25 mg tablet 25 mg, Oral, Daily (standard)   empty container Misc Use as directed to dispose of Humira pens.   flash glucose sensor (FREESTYLE LIBRE 2 SENSOR) kit Use to monitor blood glucose levels continuously. Change sensor every 10 days   fluticasone propionate (FLONASE) 50 mcg/actuation nasal spray 1 spray, Each Nare, 2 times a day   folic acid (FOLVITE) 1 MG tablet 1 mg, Oral, Daily (standard)   folic acid/multivit-min/lutein (CENTRUM SILVER ORAL) 1 tablet, Oral, daily   insulin glargine (BASAGLAR, LANTUS) 100 unit/mL (3 mL) injection pen 50 Units, Subcutaneous, Nightly   lancing device Misc USE 4 TIMES DAILY BEFORE MEALS AND NIGHTLY ( USAR  CUATRO VECES AL DIA SPBM Y TODAS LAS NOCHES )   losartan (COZAAR) 25 MG tablet 25 mg, Oral, Daily (standard)   metFORMIN (GLUCOPHAGE) 1000 MG tablet Take 1 tablet (1,000 mg total) by mouth 2 (two) times a day with meals.   methotrexate 2.5 MG tablet 20 mg, Oral, Weekly   NON FORMULARY Low dose Naltrexone 4.5 mg PO daily   omeprazole (PRILOSEC) 20 MG capsule 20 mg, Oral, Daily (standard)   pen needle, diabetic (ULTICARE PEN NEEDLE) 32 gauge x 5/32 (4 mm) Ndle Use as directed two (2) times a day   polyethylene glycol (GLYCOLAX) 17 gram/dose powder 17 g, Oral, Daily (standard)   predniSONE (DELTASONE) 10 MG tablet    predniSONE (DELTASONE) 20 MG tablet 20 mg, Oral, Daily (standard)   traZODone (DESYREL) 50 MG tablet 50 mg, Oral, Nightly        Objective:     Vitals  There were no vitals taken for this visit.     Wt Readings from Last 3 Encounters:   06/03/22 78.9 kg (174 lb)   06/02/22 79.1 kg (174 lb 4.8 oz)   04/22/22 80.7 kg (178 lb)       Physical Exam  General:  Pleasant female sitting in chair in nad.   Neck: Supple, JVP {mfjJVD:24844::normal}.   Resp:   CTAB bilaterally with normal WOB.   Cardio:  {mfjmurmur:26292::RRR without m/r/g.}   Abdomen:   Soft, non-distended, non-tender.   Extremities: Warm well-perfused bilaterally. {mfjedema:24847::No} edema {mfjrightleft:24848::.}   MSK: No joint swelling or erythema. No gross deformities.   Skin: No rashes   Neuro: CN II-XII grossly intact. Strength grossly intact.    Psych: Alert and oriented x3. Appropriate mood.      ECG (07/07/22) - independently interpreted.   ***    Most Recent Labs   Lab Results   Component Value Date    NA 139 12/27/2021    K 4.0 12/27/2021    CL 104 12/27/2021    CO2 26.0 12/27/2021     Lab Results   Component Value Date    BUN 10 04/22/2022    BUN 12 12/27/2021    BUN 11 02/28/2011     Lab Results   Component Value Date    Creatinine Whole Blood, POC 0.8 10/30/2019    Creatinine 0.63 04/22/2022    Creatinine 0.80 12/27/2021    Creatinine 0.54 (L) 02/28/2011     No results found for: PROBNP  Lab Results   Component Value Date    Cholesterol 107 06/02/2022    Triglycerides 128 06/02/2022    HDL 30 (L) 06/02/2022    Non-HDL Cholesterol 77 06/02/2022    LDL Calculated 51 06/02/2022 Nightly   ADALIMUMAB PEN CITRATE FREE 40 MG/0.4 ML 40 mg, Subcutaneous, Every 7 days  Patient not taking: Reported on 07/08/2022   blood-glucose meter kit Use as instructed   buprenorphine 5 mcg/hour PTWK transdermal patch 1 patch, Transdermal, Every 7 days   lancing device Misc USE 4 TIMES DAILY BEFORE MEALS AND NIGHTLY ( USAR CUATRO VECES AL DIA SPBM Y TODAS LAS NOCHES )   predniSONE (DELTASONE) 10 MG tablet    predniSONE (DELTASONE) 20 MG tablet 20 mg, Oral, Daily (standard)  Patient not taking: Reported on 07/08/2022        Objective:     Vitals  BP 130/64  - Pulse 92  - Temp 36.9 ??C (98.5 ??F)  - Wt 80.6 kg (177 lb 12.8 oz)  - SpO2 98%  -  BMI 33.60 kg/m??      Wt Readings from Last 3 Encounters:   07/08/22 80.6 kg (177 lb 12.8 oz)   06/03/22 78.9 kg (174 lb)   06/02/22 79.1 kg (174 lb 4.8 oz)       Physical Exam  General:  Pleasant female sitting in chair in nad.   Neck: Supple, JVP normal.   Resp:   CTAB bilaterally with normal WOB.   Cardio:  RRR without m/r/g.   Abdomen:   Soft, non-distended, non-tender.   Extremities: Warm well-perfused bilaterally. No edema .   MSK: No joint swelling or erythema. No gross deformities.   Skin: No rashes   Neuro: CN II-XII grossly intact. Strength grossly intact.    Psych: Alert and oriented x3. Appropriate mood.      ECG (07/08/22) - independently interpreted.   Normal sinus rhythm, normal axis, low voltage precordial leads    Most Recent Labs   Lab Results   Component Value Date    NA 139 12/27/2021    K 4.0 12/27/2021    CL 104 12/27/2021    CO2 26.0 12/27/2021     Lab Results   Component Value Date    BUN 10 04/22/2022    BUN 12 12/27/2021    BUN 11 02/28/2011     Lab Results   Component Value Date    Creatinine Whole Blood, POC 0.8 10/30/2019    Creatinine 0.63 04/22/2022    Creatinine 0.80 12/27/2021    Creatinine 0.54 (L) 02/28/2011     No results found for: PROBNP  Lab Results   Component Value Date    Cholesterol 107 06/02/2022    Triglycerides 128 06/02/2022    HDL 30 (L) 06/02/2022    Non-HDL Cholesterol 77 06/02/2022    LDL Calculated 51 06/02/2022

## 2022-07-08 ENCOUNTER — Ambulatory Visit
Admit: 2022-07-08 | Discharge: 2022-07-09 | Payer: PRIVATE HEALTH INSURANCE | Attending: Student in an Organized Health Care Education/Training Program | Primary: Student in an Organized Health Care Education/Training Program

## 2022-07-08 DIAGNOSIS — I2510 Atherosclerotic heart disease of native coronary artery without angina pectoris: Principal | ICD-10-CM

## 2022-07-08 DIAGNOSIS — I351 Nonrheumatic aortic (valve) insufficiency: Principal | ICD-10-CM

## 2022-07-08 DIAGNOSIS — R0789 Other chest pain: Principal | ICD-10-CM

## 2022-07-08 DIAGNOSIS — E785 Hyperlipidemia, unspecified: Principal | ICD-10-CM

## 2022-07-08 DIAGNOSIS — I6523 Occlusion and stenosis of bilateral carotid arteries: Principal | ICD-10-CM

## 2022-07-08 DIAGNOSIS — R079 Chest pain, unspecified: Principal | ICD-10-CM

## 2022-07-08 DIAGNOSIS — I10 Essential (primary) hypertension: Principal | ICD-10-CM

## 2022-07-08 NOTE — Unmapped (Signed)
Thank you for coming to see [place] today at [facility] at [facility]. Please call us at [phone] if you have any questions.     We will get pictures of your heart with an echocardiogram. This test will evaluate the structure and function of your heart.      I have ordered a nuclear stress test to evaluate for significant heart artery blockages.   You will get a call to schedule the stress test. If you do not receive a call in the next 2-3 days, please call [phone] to schedule your test.     Instructions for nuclear stress test:  - nothing to eat after midnight the night before the test  - no caffeine after lunch time on the day BEFORE the test. No coffee, no tea, no soda. No decaffeinated beverages.   - sips of other beverages are ok the morning of the test  - bring any of your medications that you typically take in the morning with you (you can take them after the test). Bring any inhalers that you use with you in case you need them after the test.  - you will likely see the results of your study in your MyChart app before I will. Please give me and my team 2-3 days to reach out to you with the results and we will let you know if any other testing is needed.

## 2022-07-11 NOTE — Unmapped (Signed)
Reason for call: Pt's Daughter is requesting for a call back. She is requesting if Dr. Raphael Gibney can requested for Pt's infusion to be done a week ealier since Pt is in alot of pain currently and has swelling. Daughter states that Pt gets infusions at [facility] in [place].       Last ov: 04/22/2022  Next ov: 08/12/2022

## 2022-07-12 NOTE — Unmapped (Signed)
Spoke to patients daughter. Her pain started last week. Patient having severe pain in her fingers and right arm. Patients fingers are swollen and warm. The pain is on the right arm and shoulder. Now her ankles and legs are also swelling. She stated upper body is worse than lower body. She does have stiffness in right arm. She got better with the infusions but she is concerned its not lasting through the full time in between infusions.

## 2022-07-12 NOTE — Unmapped (Signed)
Reviewed during OV

## 2022-07-20 NOTE — Unmapped (Signed)
Specialty Medication(s): Humira    Anne Barber has been dis-enrolled from the [facility] specialty pharmacy services due to a change in therapy. The patient is now taking infliximab and is not filling at the [facility].    Additional information provided to the patient: managed by Rheum/ Dr. Francina Ames  [facility] Specialty Pharmacist

## 2022-07-20 NOTE — Unmapped (Signed)
..  Anne Barber has been contacted in regards to their refill of Humira. At this time, they have declined refill due to   Provider discontinuing the Humira and switched Patient to Infusions of Inflectra 400 mg at [facility] infusion center .Last infusion was on 3/15-next appointment scheduled for 3/29. Refill assessment call date has been updated per the patient's daughter's request.

## 2022-07-21 MED FILL — POLYETHYLENE GLYCOL 3350 17 GRAM/DOSE ORAL POWDER: ORAL | 14 days supply | Qty: 238 | Fill #3

## 2022-07-27 ENCOUNTER — Ambulatory Visit
Admit: 2022-07-27 | Discharge: 2022-07-28 | Payer: PRIVATE HEALTH INSURANCE | Attending: Student in an Organized Health Care Education/Training Program | Primary: Student in an Organized Health Care Education/Training Program

## 2022-07-27 DIAGNOSIS — M797 Fibromyalgia: Principal | ICD-10-CM

## 2022-07-27 DIAGNOSIS — Z79899 Other long term (current) drug therapy: Principal | ICD-10-CM

## 2022-07-27 DIAGNOSIS — L732 Hidradenitis suppurativa: Principal | ICD-10-CM

## 2022-07-27 DIAGNOSIS — R112 Nausea with vomiting, unspecified: Principal | ICD-10-CM

## 2022-07-27 MED ORDER — ONDANSETRON 4 MG DISINTEGRATING TABLET
ORAL_TABLET | Freq: Three times a day (TID) | 4 refills | 6.00000 days | Status: CP | PRN
Start: 2022-07-27 — End: 2022-07-31
  Filled 2022-08-02: qty 16, 6d supply, fill #0

## 2022-07-27 MED ORDER — DICLOFENAC 1 % TOPICAL GEL
Freq: Four times a day (QID) | TOPICAL | 0 refills | 13.00000 days | Status: CP
Start: 2022-07-27 — End: 2023-07-27
  Filled 2022-08-02: qty 100, 13d supply, fill #0

## 2022-08-11 DIAGNOSIS — Z794 Long term (current) use of insulin: Principal | ICD-10-CM

## 2022-08-11 DIAGNOSIS — E11319 Type 2 diabetes mellitus with unspecified diabetic retinopathy without macular edema: Principal | ICD-10-CM

## 2022-08-11 MED ORDER — LANTUS SOLOSTAR U-100 INSULIN 100 UNIT/ML (3 ML) SUBCUTANEOUS PEN
Freq: Every evening | SUBCUTANEOUS | 1 refills | 90 days | Status: CP
Start: 2022-08-11 — End: 2023-08-11
  Filled 2022-08-16: qty 45, 90d supply, fill #0

## 2022-08-11 MED ORDER — TRULICITY 0.75 MG/0.5 ML SUBCUTANEOUS PEN INJECTOR
SUBCUTANEOUS | 1 refills | 84 days | Status: CP
Start: 2022-08-11 — End: 2023-08-11
  Filled 2022-08-16: qty 6, 84d supply, fill #0

## 2022-08-12 ENCOUNTER — Ambulatory Visit: Admit: 2022-08-12 | Discharge: 2022-08-12 | Payer: MEDICAID

## 2022-08-12 DIAGNOSIS — Z794 Long term (current) use of insulin: Principal | ICD-10-CM

## 2022-08-12 DIAGNOSIS — E11319 Type 2 diabetes mellitus with unspecified diabetic retinopathy without macular edema: Principal | ICD-10-CM

## 2022-08-12 DIAGNOSIS — Z79899 Other long term (current) drug therapy: Principal | ICD-10-CM

## 2022-08-12 DIAGNOSIS — M1380 Other specified arthritis, unspecified site: Principal | ICD-10-CM

## 2022-08-12 MED ORDER — METHOTREXATE SODIUM 2.5 MG TABLET
ORAL_TABLET | ORAL | 1 refills | 84 days | Status: CP
Start: 2022-08-12 — End: 2023-08-12
  Filled 2022-08-16: qty 96, 84d supply, fill #0

## 2022-08-12 MED ORDER — LEUCOVORIN CALCIUM 5 MG TABLET
ORAL_TABLET | ORAL | 3 refills | 84 days | Status: CP
Start: 2022-08-12 — End: 2023-08-12
  Filled 2022-08-16: qty 48, 84d supply, fill #0

## 2022-08-16 MED FILL — ASPIRIN 81 MG TABLET,DELAYED RELEASE: ORAL | 90 days supply | Qty: 90 | Fill #2

## 2022-08-16 MED FILL — CETIRIZINE 10 MG TABLET: ORAL | 30 days supply | Qty: 30 | Fill #5

## 2022-08-16 MED FILL — METFORMIN 1,000 MG TABLET: ORAL | 90 days supply | Qty: 180 | Fill #2

## 2022-08-18 ENCOUNTER — Ambulatory Visit
Admit: 2022-08-18 | Discharge: 2022-08-19 | Payer: MEDICAID | Attending: Student in an Organized Health Care Education/Training Program | Primary: Student in an Organized Health Care Education/Training Program

## 2022-08-18 DIAGNOSIS — E113513 Type 2 diabetes mellitus with proliferative diabetic retinopathy with macular edema, bilateral: Principal | ICD-10-CM

## 2022-09-02 ENCOUNTER — Ambulatory Visit: Admit: 2022-09-02 | Discharge: 2022-09-03 | Payer: MEDICAID

## 2022-09-02 DIAGNOSIS — I10 Essential (primary) hypertension: Principal | ICD-10-CM

## 2022-09-02 DIAGNOSIS — E785 Hyperlipidemia, unspecified: Principal | ICD-10-CM

## 2022-09-02 DIAGNOSIS — I2510 Atherosclerotic heart disease of native coronary artery without angina pectoris: Principal | ICD-10-CM

## 2022-09-02 DIAGNOSIS — R079 Chest pain, unspecified: Principal | ICD-10-CM

## 2022-09-26 ENCOUNTER — Ambulatory Visit: Admit: 2022-09-26 | Discharge: 2022-09-27 | Payer: MEDICAID

## 2022-09-26 DIAGNOSIS — I6523 Occlusion and stenosis of bilateral carotid arteries: Principal | ICD-10-CM

## 2022-09-26 DIAGNOSIS — M1380 Other specified arthritis, unspecified site: Principal | ICD-10-CM

## 2022-09-26 DIAGNOSIS — L732 Hidradenitis suppurativa: Principal | ICD-10-CM

## 2022-09-26 DIAGNOSIS — E113513 Type 2 diabetes mellitus with proliferative diabetic retinopathy with macular edema, bilateral: Principal | ICD-10-CM

## 2022-09-26 DIAGNOSIS — E11319 Type 2 diabetes mellitus with unspecified diabetic retinopathy without macular edema: Principal | ICD-10-CM

## 2022-09-26 DIAGNOSIS — R399 Unspecified symptoms and signs involving the genitourinary system: Principal | ICD-10-CM

## 2022-09-26 DIAGNOSIS — Z794 Long term (current) use of insulin: Principal | ICD-10-CM

## 2022-09-26 DIAGNOSIS — I10 Essential (primary) hypertension: Principal | ICD-10-CM

## 2022-09-26 MED ORDER — TRULICITY 1.5 MG/0.5 ML SUBCUTANEOUS PEN INJECTOR
SUBCUTANEOUS | 1 refills | 28 days | Status: CP
Start: 2022-09-26 — End: ?
  Filled 2022-10-05: qty 2, 28d supply, fill #0

## 2022-09-28 DIAGNOSIS — N3000 Acute cystitis without hematuria: Principal | ICD-10-CM

## 2022-09-28 MED ORDER — NITROFURANTOIN MONOHYDRATE/MACROCRYSTALS 100 MG CAPSULE
ORAL_CAPSULE | Freq: Two times a day (BID) | ORAL | 0 refills | 5 days | Status: CP
Start: 2022-09-28 — End: 2022-10-03

## 2022-09-28 NOTE — Unmapped (Signed)
Spoke to pt's daughter--pt with UTI, will send rx for [employer]

## 2022-09-29 NOTE — Unmapped (Signed)
[facility] Specialty Medication Onboarding    Specialty Medication: TRULICITY 1.5 mg/0.5 mL Pnij (dulaglutide)  Prior Authorization: Not Required   Financial Assistance: No - copay  <$25  Final Copay/Day Supply: $0 / 28    Insurance Restrictions: None     Notes to Pharmacist: dose change  Credit Card on File: no    The triage team has completed the benefits investigation and has determined that the patient is able to fill this medication at [facility]. Please contact the patient to complete the onboarding or follow up with the prescribing physician as needed.

## 2022-09-30 NOTE — Unmapped (Signed)
pt's daughter will call back. Per Dr. Lovenia Kim would like to schedule this patient earlier in one of my Friday clinic slots. 7/19 or 7/26 are preferable, but can do 9/6 if neither of those days work. Thanks, Clifton Custard

## 2022-10-03 NOTE — Unmapped (Signed)
[facility] Cardiology at [facility]  [address], [place], [place] [address]   Phone: [phone]  Fax: [phone]    Date of Service: 10/05/2022    Patient Clinic Note    PCP: Referring Provider:   Deneise Lever, FNP  [address] [place] [address]  Phone: [phone]  Fax: [phone] Deneise Lever, FNP  [address]  [place],  [place] [address]  Phone: [phone]  Fax: [phone]       Assessment and Plan:     Anne Barber is a 62 y.o. female with past medical history of fibromyalgia, DMT2, mild non obstructive bilateral carotid artery disease, coronary artery calcification noted on CT chest, hyperlipidemia, mild aortic regurgitation who presents for cardiac care.     Chest pain, non cardiac   -SPECT myocardial perfusion study with coronary artery calcium but normal perfusion.  Echocardiogram today largely normal.  - Would not recommend further cardiac workup at this time.  Differential includes noncardiac chest pain such as GERD, esophageal issues, or her fibromyalgia which she thinks is the likely cause.    Palpitations and intermittent presyncope  - Symptoms since last visit, Zio patch placed for further evaluation.    Hypertension  Coronary artery disease noted on CT chest  Nonobstructive carotid artery disease, bilateral  Hyperlipidemia  Diabetes type 2  - Hypertension controlled.  Continue amlodipine 5, losartan 25.  - Lipids controlled, continue atorvastatin 40.  Reasonable to continue aspirin 81 mg daily as well.  - Agree with Jardiance and other medications for diabetes type 2 in the setting of coronary artery disease    Mild aortic regurgitation  - trivial on echo 7/10 . No specific monitoring necessary     Lab Results   Component Value Date    CHOL 107 06/02/2022    CHOL 151 01/29/2021    CHOL 175 02/04/2020     Lab Results   Component Value Date    HDL 30 (L) 06/02/2022    HDL 40 01/29/2021    HDL 42 02/04/2020     Lab Results   Component Value Date    LDL 51 06/02/2022    LDL 66 01/29/2021    LDL 96 02/04/2020     Lab Results   Component Value Date    VLDL 25.6 06/02/2022    VLDL 45.2 (H) 01/29/2021    VLDL 36.6 02/04/2020     Lab Results   Component Value Date    CHOLHDLRATIO 3.6 06/02/2022    CHOLHDLRATIO 3.8 01/29/2021    CHOLHDLRATIO 4.2 02/04/2020     Lab Results   Component Value Date    TRIG 128 06/02/2022    TRIG 226 (H) 01/29/2021    TRIG 183 (H) 02/04/2020       The 10-yr ASCVD Risk score Denman George DC Jr., et al., 2013) failed to calculate due to the following reason:  The 2013 10-yr ASCVD risk score is only valid if the patient does not have prior/existing clinical ASCVD (myocardial infarction, stroke, CABG, coronary angioplasty, angina or peripheral arterial disease, coronary atherosclerosis, ischemic heart disease, or cerebrovascular disease). The patient has prior/existing ASCVD.  Has Cerebrovascular Disease      Lab Results   Component Value Date    A1C 7.5 (A) 09/26/2022         Return in about 1 year (around 10/05/2023).          Subjective:     Chief Complaint:  Chief Complaint  Patient presents with    Routine Follow-up         Referring Provider: Deneise Lever, F*    History of Present Illness:     Anne Barber is a 62 y.o. female, with past medical history of DMT2, mild non obstructive bilateral carotid artery disease, coronary artery calcification noted on CT chest, hyperlipidemia, mild aortic regurgitation who presents for cardiac care.     Patient with symptoms that are largely unchanged since last visit. Still with intermittent chest pain, not related to exertion, described as worsening with deep breaths and primarily at rest. Some palpiations as well which are new with intermittent presyncope.     ------------------presenting HPI ------    Patient states she was feeling well and in her usual state of health until December 2023 where she had sudden onset substernal chest pain with radiation to her arms.  She went to [facility] ED, was ruled out for ACS, and discharged home.  Since that time this pain has recurred several times, typically at rest but can also occur with exertion.  She is not very active and does not exercise regularly.  Notes the pain is substernal, with radiation to her bilateral arms and is also associated with diaphoresis.  She notes during these events her body feels very weak and sweaty.  These events last for few minutes and then disappear as she rests.    No palpitations, no syncope.        Cardiovascular History & Procedures:  Cardiovascular Problems:  Coronary artery disease noted on CT chest  Diabetes type 2  Hypertension  Hyperlipidemia  Nonobstructive carotid arterial disease, bilateral    Cath / PCI:  None    CV Surgery:  None    EP Procedures and Devices:  None    Non-Invasive Evaluation(s): independently reviewed the most recent study.   Echocardiogram 2019- LVEF 55%, mild AI , normal RV size and fxn   SPECT 2019 - likely normal perfusion, low risk, coronary artery calcium  SPECT myocardial perfusion study 09/02/2022-normal perfusion, moderate coronary artery calcifications, small pericardial effusion  Echocarrdiogram 10/05/22 - normal bi V function, no significant valve abnormalities     Medical History:  Past Medical History:   Diagnosis Date    Anisocoria     Cataract     Nuclear and Mild Cortical Cataracts, OU    Depression     Diabetes mellitus (CMS-HCC) Dx 1996    Type II    Diabetic retinopathy (CMS-HCC)     PDR OU and mild DME avastin injections x6 right eye  and left eye x1,PRP 2015    Dry eyes     Fractures     Glaucoma     History of Neovascular Glaucoma OD, indeterminate stage    Hypertension     PONV (postoperative nausea and vomiting)     Shoulder injury     Tuberculosis 1978    completed one year of treatment.        Surgical History:  Past Surgical History:   Procedure Laterality Date    ANKLE FRACTURE SURGERY      CATARACT EXTRACTION EXTRACAPSULAR W/ INTRAOCULAR LENS IMPLANTATION Left 10/10/2019    HAND SURGERY      PR AQUEOUS SHUNT EXTRAOC EQUAT PLATE RSVR W/GRAFT Right 10/12/2013    Procedure: AQUEOUS SHUNT-EXTRAOCULAR RESERVOIR;  Surgeon: Leda Roys, MD;  Location: MA[facility];  Service: Ophthalmology    PR COLONOSCOPY W/BIOPSY SINGLE/MULTIPLE Left 11/16/2012    Procedure: COLONOSCOPY,  FLEXIBLE, PROXIMAL TO SPLENIC FLEXURE; WITH BIOPSY, SINGLE OR MULTIPLE;  Surgeon: Lauretta Grill, MD;  Location: [facility] PROCEDURES [facility];  Service: Gastroenterology    PR DRAIN ANT [facility] BLOOD Right 10/12/2013    Procedure: PARACENTESIS (SEPART PROC); W/REMOV BLD;  Surgeon: Leda Roys, MD;  Location: MAIN OR [facility];  Service: Ophthalmology    PR EYE SURG POST SGMT PROC UNLISTED Left 08/10/2016    PPV/EL/AIR left eye for [facility]    PR OPEN RX STERNUM FRACTURE N/A 03/01/2018    Procedure: OPEN TX STERNUM FX W/WO SKELETAL FIXA;  Surgeon: Cherie Dark, MD;  Location: MAIN OR [facility];  Service: Thoracic    PR REINFORCE SCLERA EYE W GRAFT Right 10/12/2013    Procedure: SCLERAL REINFORCEMENT (SEPART PROC); W/GFT;  Surgeon: Leda Roys, MD;  Location: MAIN OR [facility];  Service: Ophthalmology    PR UPPER GI ENDOSCOPY,BIOPSY N/A 11/16/2012    Procedure: UGI ENDOSCOPY; WITH BIOPSY, SINGLE OR MULTIPLE;  Surgeon: Lauretta Grill, MD;  [facility];  Service: Gastroenterology    PR UPPER GI ENDOSCOPY,BIOPSY N/A 05/08/2013    Procedure: UGI ENDOSCOPY; WITH BIOPSY, SINGLE OR MULTIPLE;  Surgeon: Clint Bolder, MD;  [facility]: [facility];  Service: Gastroenterology    PR VITRECTOMY,PANRETINAL LASER RX Left 09/24/2014    Procedure: VITRECTOMY, MECHANICAL, PARS PLANA APPROACH; WITH ENDOLASER PANRETINAL PHOTOCOAGULATION;  Surgeon: Rockwell Alexandria, MD;  Location: [facility] OR [facility];  Service: Ophthalmology    PR VITRECTOMY,PANRETINAL LASER RX Left 08/10/2016    Procedure: VITRECTOMY, MECHANICAL, PARS PLANA APPROACH; WITH ENDOLASER PANRETINAL PHOTOCOAGULATION;  Surgeon: Nicholaus Corolla, MD;  [facility];  Service: Ophthalmology    PR Hessie Knows LASER RX Right 06/26/2019    Procedure: VITRECTOMY, MECHANICAL, PARS PLANA APPROACH; WITH ENDOLASER PANRETINAL PHOTOCOAGULATION;  Surgeon: Nicholaus Corolla, MD;  Location: [facility] OR [facility];  Service: Ophthalmology    PR XCAPSL CTRC RMVL INSJ IO LENS PROSTH W/O ECP Right 09/26/2019    Procedure: EXTRACAPSULAR CATARACT REMOVAL W/INSERTION OF INTRAOCULAR LENS PROSTHESIS, MANUAL OR MECHANICAL TECHNIQUE WITHOUT ENDOSCOPIC CYCLOPHOTOCOAGULATION;  Surgeon: Arville Care, MD;  Location: [facility] OR [facility];  Service: Ophthalmology    PR XCAPSL CTRC RMVL INSJ IO LENS PROSTH W/O ECP Left 10/10/2019    Procedure: EXTRACAPSULAR CATARACT REMOVAL W/INSERTION OF INTRAOCULAR LENS PROSTHESIS, MANUAL OR MECHANICAL TECHNIQUE WITHOUT ENDOSCOPIC CYCLOPHOTOCOAGULATION;  Surgeon: Arville Care, MD;  Location: [facility] OR [facility];  Service: Ophthalmology    SHOULDER SURGERY  2016    right    TUBAL LIGATION      YAG CAPSULOTOMY - OU - BOTH EYES Bilateral 11/08/2019       Social History:   reports that she has never smoked. She has never used smokeless tobacco. She reports that she does not drink alcohol and does not use drugs.    Family History:  family history includes Diabetes in her brother, grandchild, and sister; No Known Problems in her daughter, daughter, father, maternal aunt, maternal grandfather, maternal grandmother, maternal uncle, mother, paternal aunt, paternal grandmother, paternal uncle, and another family member; Psoriasis in her paternal grandfather.      Review of Systems:   Except as noted in the HPI, the remainder of 10 systems reviewed is negative.     Allergies:  Allergies   Allergen Reactions    Other      steroids       Medications:   Prior to Admission medications    Medication Dose,  Route, Frequency   amlodipine (NORVASC) 5 MG tablet 5 mg, Oral, Daily (standard)   aspirin (ECOTRIN) 81 MG tablet 81 mg, Oral, Daily (standard)   atorvastatin (LIPITOR) 40 MG tablet 40 mg, Oral, Daily (standard) BD INSULIN SYRINGE ULT-FINE II 1 mL 31 gauge x 5/16 Syrg 1 Package, Miscellaneous, 2 times a day   blood sugar diagnostic (ONETOUCH ULTRA BLUE TEST STRIP) Strp USE TO CHECK BLOOD SUGAR UP TO 10 TIMES DAILY AS NEEDED   blood-glucose meter kit Use as instructed   cetirizine (ZYRTEC) 10 MG tablet 10 mg, Oral, Daily (standard)   clindamycin (CLEOCIN T) 1 % lotion Topical, Every morning   clindamycin (CLEOCIN T) 1 % Swab Apply 1 application topically Two (2) times a day.   clobetasol (TEMOVATE) 0.05 % ointment Topical, 2 times a day (standard), To lesion in groin   dexamethasone (DECADRON) 0.1 % ophthalmic solution Administer 4 drops into the left ear Two (2) times a day for 30 days   dextran 70-hypromellose, PF, (TEARS NATURAL FREE) 0.1-0.3 % Dpet No dose, route, or frequency recorded.   diclofenac sodium (VOLTAREN) 1 % gel 2 g, Topical, 4 times a day   dorzolamide-timolol (COSOPT) 22.3-6.8 mg/mL ophthalmic solution 1 drop, Both Eyes, 2 times a day (standard)   dulaglutide (TRULICITY) 1.5 mg/0.5 mL PnIj 1.5 mg, Subcutaneous, Every 7 days   DULoxetine (CYMBALTA) 60 MG capsule 60 mg, Oral, Daily (standard)   empagliflozin (JARDIANCE) 25 mg tablet 25 mg, Oral, Daily (standard)   empty container Misc Use as directed to dispose of Humira pens.   fluticasone propionate (FLONASE) 50 mcg/actuation nasal spray 1 spray, Each Nare, 2 times a day   folic acid/multivit-min/lutein (CENTRUM SILVER ORAL) 1 tablet, Oral, daily   ibuprofen (MOTRIN) 400 MG tablet 400 mg, Oral   insulin glargine (LANTUS SOLOSTAR U-100 INSULIN) 100 unit/mL (3 mL) injection pen 50 Units, Subcutaneous, Nightly   lancing device Misc USE 4 TIMES DAILY BEFORE MEALS AND NIGHTLY ( USAR CUATRO VECES AL DIA SPBM Y TODAS LAS NOCHES )   losartan (COZAAR) 25 MG tablet 25 mg, Oral, Daily (standard)   metFORMIN (GLUCOPHAGE) 1000 MG tablet Take 1 tablet (1,000 mg total) by mouth 2 (two) times a day with meals.   methotrexate 2.5 MG tablet 20 mg, Oral, Weekly   NON FORMULARY Low dose Naltrexone 4.5 mg PO daily   omeprazole (PRILOSEC) 20 MG capsule 20 mg, Oral, Daily (standard)   pen needle, diabetic (ULTICARE PEN NEEDLE) 32 gauge x 5/32 (4 mm) Ndle Use as directed two (2) times a day   polyethylene glycol (GLYCOLAX) 17 gram/dose powder 17 g, Oral, Daily (standard)   traZODone (DESYREL) 50 MG tablet 50 mg, Oral, Nightly   benzonatate (TESSALON) 100 MG capsule    buprenorphine 5 mcg/hour PTWK transdermal patch 1 patch, Transdermal, Every 7 days  Patient not taking: Reported on 10/05/2022   flash glucose sensor (FREESTYLE LIBRE 2 SENSOR) kit Use to monitor blood glucose levels continuously. Change sensor every 10 days  Patient not taking: Reported on 08/12/2022   leuCOVorin (WELLCOVORIN) 5 mg tablet 20 mg, Oral, Weekly, To be taken 12 hours AFTER methotrexate dose   predniSONE (DELTASONE) 10 MG tablet    predniSONE (DELTASONE) 20 MG tablet 20 mg, Oral, Daily (standard)  Patient not taking: Reported on 09/26/2022        Objective:     Vitals  BP 104/56 (BP Site: L Arm, BP Position: Sitting, BP Cuff Size: Medium)  - Pulse 85  -  Wt 80.7 kg (178 lb)  - SpO2 97%  - BMI 33.63 kg/m??      Wt Readings from Last 3 Encounters:   10/05/22 80.7 kg (178 lb)   09/26/22 81.9 kg (180 lb 9.6 oz)   08/12/22 79.9 kg (176 lb 3.2 oz)       Physical Exam  General:  Pleasant female sitting in chair in nad.   Neck: Supple, JVP normal.   Resp:   CTAB bilaterally with normal WOB.   Cardio:  RRR without m/r/g.   Abdomen:   Soft, non-distended, non-tender.   Extremities: Warm well-perfused bilaterally. No edema .   MSK: No joint swelling or erythema. No gross deformities.   Skin: No rashes   Neuro: CN II-XII grossly intact. Strength grossly intact.    Psych: Alert and oriented x3. Appropriate mood.      ECG (10/06/22) - independently interpreted.   Noen today - last 07/07/21 - independently reviewed - Normal sinus rhythm, normal axis, low voltage precordial leads    Most Recent Labs   Lab Results   Component Value Date    NA 139 12/27/2021    K 4.0 12/27/2021    CL 104 12/27/2021    CO2 26.0 12/27/2021     Lab Results   Component Value Date    BUN 13 08/12/2022    BUN 10 04/22/2022    BUN 11 02/28/2011     Lab Results   Component Value Date    Creatinine Whole Blood, POC 0.8 10/30/2019    Creatinine 0.79 08/12/2022    Creatinine 0.63 04/22/2022    Creatinine 0.54 (L) 02/28/2011     No results found for: PROBNP  Lab Results   Component Value Date    Cholesterol 107 06/02/2022    Triglycerides 128 06/02/2022    HDL 30 (L) 06/02/2022    Non-HDL Cholesterol 77 06/02/2022    LDL Calculated 51 06/02/2022
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
## 2022-10-05 ENCOUNTER — Ambulatory Visit
Admit: 2022-10-05 | Discharge: 2022-10-06 | Payer: MEDICAID | Attending: Student in an Organized Health Care Education/Training Program | Primary: Student in an Organized Health Care Education/Training Program

## 2022-10-05 ENCOUNTER — Ambulatory Visit: Admit: 2022-10-05 | Discharge: 2022-10-06 | Payer: MEDICAID

## 2022-10-05 DIAGNOSIS — R002 Palpitations: Principal | ICD-10-CM

## 2022-10-05 NOTE — Unmapped (Signed)
Thank you for coming to see us today at [facility] [place]. Please call us at [phone] if you have any questions.

## 2022-10-07 MED FILL — ATORVASTATIN 40 MG TABLET: ORAL | 90 days supply | Qty: 90 | Fill #3

## 2022-10-07 MED FILL — JARDIANCE 25 MG TABLET: ORAL | 90 days supply | Qty: 90 | Fill #1

## 2022-10-24 MED FILL — AMLODIPINE 5 MG TABLET: ORAL | 90 days supply | Qty: 90 | Fill #2

## 2022-10-24 MED FILL — OMEPRAZOLE 20 MG CAPSULE,DELAYED RELEASE: ORAL | 90 days supply | Qty: 90 | Fill #2

## 2022-10-24 MED FILL — LOSARTAN 25 MG TABLET: ORAL | 90 days supply | Qty: 90 | Fill #1

## 2022-10-28 NOTE — Unmapped (Signed)
I spoke with the patient's daughter and informed her that the forms were faxed on 10/11/22. I will re fax forms to case worker today.

## 2022-10-28 NOTE — Unmapped (Signed)
PT's daughter is inquiring about Berkley Harvey that was sent by case worker in regards to [employer]. Please advise.

## 2022-10-31 MED FILL — LEUCOVORIN CALCIUM 5 MG TABLET: ORAL | 84 days supply | Qty: 48 | Fill #1

## 2022-10-31 MED FILL — METHOTREXATE SODIUM 2.5 MG TABLET: ORAL | 84 days supply | Qty: 96 | Fill #1

## 2022-11-11 NOTE — Unmapped (Signed)
[facility] Refill Coordination Note    Specialty Lite Medication(s) to be Shipped:   Trulicity    Other medication(s) to be shipped:  aspirin , Lantus Solostar, butrans, metformin     Anne Barber, DOB: September 06, 1960  Phone: [phone] (home)       All above HIPAA information was verified with patient's family member, daughter [name].     Was a Nurse, learning disability used for this call? No    Changes to medications: Matalie reports no changes at this time.  Changes to insurance: No      REFERRAL TO PHARMACIST     Referral to the pharmacist: Not needed      [facility]     Shipping address confirmed in Epic.     Delivery Scheduled: Yes, Expected medication delivery date: 11/14/22.     Medication will be delivered via Same Day Courier to the prescription address in Epic WAM.    Vinisha Faxon[facility] Specialty Technician

## 2022-11-14 DIAGNOSIS — M1380 Other specified arthritis, unspecified site: Principal | ICD-10-CM

## 2022-11-14 DIAGNOSIS — Z79891 Long term (current) use of opiate analgesic: Principal | ICD-10-CM

## 2022-11-14 DIAGNOSIS — M797 Fibromyalgia: Principal | ICD-10-CM

## 2022-11-16 MED FILL — LANTUS SOLOSTAR U-100 INSULIN 100 UNIT/ML (3 ML) SUBCUTANEOUS PEN: SUBCUTANEOUS | 90 days supply | Qty: 45 | Fill #1

## 2022-11-16 MED FILL — ASPIRIN 81 MG TABLET,DELAYED RELEASE: ORAL | 90 days supply | Qty: 90 | Fill #3

## 2022-11-16 MED FILL — TRULICITY 1.5 MG/0.5 ML SUBCUTANEOUS PEN INJECTOR: SUBCUTANEOUS | 28 days supply | Qty: 2 | Fill #1

## 2022-11-16 MED FILL — METFORMIN 1,000 MG TABLET: ORAL | 90 days supply | Qty: 180 | Fill #3

## 2022-11-18 NOTE — Unmapped (Signed)
Anne Barber left voicemail today stating she has not received forms. I returned her call and informed her I have faxed forms twice and have received conformation.  Case worker stated she has had some issues with receiving faxes and will stop by the office to pick up hard copy.   Forms printed and ready for pick up.

## 2022-11-30 NOTE — Unmapped (Signed)
Forms are in the Visual merchandiser.  Thank you.

## 2022-11-30 NOTE — Unmapped (Signed)
Anne Barber with [employer] is calling back in regards to forms requested. She stated they had tried to be faxed back but would not go through so she would like to come by the office at 11:30 to pick forms up.

## 2022-12-02 ENCOUNTER — Ambulatory Visit
Admit: 2022-12-02 | Discharge: 2022-12-03 | Payer: PRIVATE HEALTH INSURANCE | Attending: Female Pelvic Medicine and Reconstructive Surgery | Primary: Female Pelvic Medicine and Reconstructive Surgery

## 2022-12-02 DIAGNOSIS — R399 Unspecified symptoms and signs involving the genitourinary system: Principal | ICD-10-CM

## 2022-12-02 DIAGNOSIS — N393 Stress incontinence (female) (male): Principal | ICD-10-CM

## 2022-12-02 DIAGNOSIS — N3281 Overactive bladder: Principal | ICD-10-CM

## 2022-12-02 MED ORDER — VIBEGRON 75 MG TABLET
ORAL_TABLET | Freq: Every day | ORAL | 11 refills | 30 days | Status: CP
Start: 2022-12-02 — End: ?
  Filled 2022-12-06: qty 30, 30d supply, fill #0
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
## 2022-12-02 NOTE — Unmapped (Addendum)
Plan para hoy:  1) Preg??ntele a su m??dico de cabecera sobre la posibilidad de controlar la apnea del sue??o.   2) Inicie vibegron para la vejiga hiperactiva
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
## 2022-12-05 ENCOUNTER — Ambulatory Visit: Admit: 2022-12-05 | Discharge: 2022-12-05 | Payer: PRIVATE HEALTH INSURANCE

## 2022-12-05 ENCOUNTER — Ambulatory Visit
Admit: 2022-12-05 | Discharge: 2022-12-05 | Payer: PRIVATE HEALTH INSURANCE | Attending: Student in an Organized Health Care Education/Training Program | Primary: Student in an Organized Health Care Education/Training Program

## 2022-12-05 DIAGNOSIS — E11319 Type 2 diabetes mellitus with unspecified diabetic retinopathy without macular edema: Principal | ICD-10-CM

## 2022-12-05 DIAGNOSIS — M1380 Other specified arthritis, unspecified site: Principal | ICD-10-CM

## 2022-12-05 DIAGNOSIS — Z794 Long term (current) use of insulin: Principal | ICD-10-CM

## 2022-12-05 DIAGNOSIS — N3000 Acute cystitis without hematuria: Principal | ICD-10-CM

## 2022-12-05 LAB — CBC W/ AUTO DIFF
BASOPHILS ABSOLUTE COUNT: 0 10*9/L (ref 0.0–0.1)
BASOPHILS RELATIVE PERCENT: 0.5 %
EOSINOPHILS ABSOLUTE COUNT: 0.1 10*9/L (ref 0.0–0.5)
EOSINOPHILS RELATIVE PERCENT: 2.3 %
HEMATOCRIT: 33.8 % — ABNORMAL LOW (ref 34.0–44.0)
HEMOGLOBIN: 11 g/dL — ABNORMAL LOW (ref 11.3–14.9)
LYMPHOCYTES ABSOLUTE COUNT: 1.3 10*9/L (ref 1.1–3.6)
LYMPHOCYTES RELATIVE PERCENT: 24.7 %
MEAN CORPUSCULAR HEMOGLOBIN CONC: 32.5 g/dL (ref 32.0–36.0)
MEAN CORPUSCULAR HEMOGLOBIN: 27.3 pg (ref 25.9–32.4)
MEAN CORPUSCULAR VOLUME: 83.9 fL (ref 77.6–95.7)
MEAN PLATELET VOLUME: 9.9 fL (ref 6.8–10.7)
MONOCYTES ABSOLUTE COUNT: 0.5 10*9/L (ref 0.3–0.8)
MONOCYTES RELATIVE PERCENT: 9.1 %
NEUTROPHILS ABSOLUTE COUNT: 3.2 10*9/L (ref 1.8–7.8)
NEUTROPHILS RELATIVE PERCENT: 63.4 %
PLATELET COUNT: 202 10*9/L (ref 150–450)
RED BLOOD CELL COUNT: 4.02 10*12/L (ref 3.95–5.13)
RED CELL DISTRIBUTION WIDTH: 19.4 % — ABNORMAL HIGH (ref 12.2–15.2)
WBC ADJUSTED: 5.1 10*9/L (ref 3.6–11.2)

## 2022-12-05 LAB — AST: AST (SGOT): 21 U/L (ref <=34)

## 2022-12-05 LAB — ALT: ALT (SGPT): 17 U/L (ref 10–49)

## 2022-12-05 LAB — CREATININE
CREATININE: 0.67 mg/dL
EGFR CKD-EPI (2021) FEMALE: >90 mL/min/{1.73_m2} (ref >=60)

## 2022-12-05 MED ORDER — LEUCOVORIN CALCIUM 5 MG TABLET
ORAL_TABLET | ORAL | 1 refills | 168 days | Status: CP
Start: 2022-12-05 — End: 2023-12-05
  Filled 2023-01-06: qty 12, 84d supply, fill #0

## 2022-12-05 MED ORDER — METHOTREXATE SODIUM 2.5 MG TABLET
ORAL_TABLET | ORAL | 2 refills | 84 days | Status: CP
Start: 2022-12-05 — End: 2023-12-05
  Filled 2023-02-03: qty 96, 84d supply, fill #0

## 2022-12-05 MED ORDER — TRULICITY 1.5 MG/0.5 ML SUBCUTANEOUS PEN INJECTOR
SUBCUTANEOUS | 3 refills | 28 days | Status: CP
Start: 2022-12-05 — End: ?
  Filled 2022-12-06: qty 2, 28d supply, fill #0

## 2022-12-05 MED ORDER — NITROFURANTOIN MONOHYDRATE/MACROCRYSTALS 100 MG CAPSULE
ORAL_CAPSULE | Freq: Two times a day (BID) | ORAL | 0 refills | 5 days | Status: CP
Start: 2022-12-05 — End: 2022-12-10
  Filled 2022-12-06: qty 10, 5d supply, fill #0

## 2022-12-05 NOTE — Unmapped (Signed)
Patient is requesting the following refill  Requested Prescriptions     Pending Prescriptions Disp Refills    dulaglutide (TRULICITY) 1.5 mg/0.5 mL PnIj 2 mL 1     Sig: Inject 0.5 mL (1.5 mg total) under the skin every seven (7) days.       Recent Visits  Date Type Provider Dept   09/26/22 Office Visit Deneise Lever, FNP [facility] [address] [facility]   06/02/22 Office Visit Johnn Hai, Loleta Rose, FNP [place] [facility] [address] [facility]   02/01/22 Office Visit Johnn Hai, Loleta Rose, FNP [facility] [facility] [address] [facility]   Showing recent visits within past 365 days and meeting all other requirements  Future Appointments  Date Type Provider Dept   02/02/23 Appointment Deneise Lever, FNP  [facility] [address] At [facility]   Showing future appointments within next 365 days and meeting all other requirements       Labs: A1c:   HGB A1C, POC (%)   Date Value   11/16/2016 11.3 (H)     Hemoglobin A1C (%)   Date Value   09/26/2022 7.5 (A)

## 2022-12-05 NOTE — Unmapped (Addendum)
We will work with your infusion company to increase your infusions to every 6 weeks instead of every 8 weeks     I'll see you in 4 months and we can make further changes from there    We recommend the flu and covid vaccine- these are available in any pharmacy     Trabajaremos con su compa????a de infusiones para aumentar sus infusiones a cada 6 semanas en lugar de cada 8 semanas.     Te ver?? en 4 meses y podremos hacer m??s cambios a partir de ah??.

## 2022-12-06 DIAGNOSIS — Z794 Long term (current) use of insulin: Principal | ICD-10-CM

## 2022-12-06 DIAGNOSIS — E11319 Type 2 diabetes mellitus with unspecified diabetic retinopathy without macular edema: Principal | ICD-10-CM

## 2022-12-09 NOTE — Unmapped (Signed)
Received call from [facility], Dr Katrinka Blazing signed recent infusion order, but his name and NPI are not being accepted when submitted for auth.      Medical Director and Fellows Supervisor notified.  Also, Updated [facility] nurse portal

## 2022-12-12 ENCOUNTER — Ambulatory Visit
Admit: 2022-12-12 | Discharge: 2022-12-13 | Payer: PRIVATE HEALTH INSURANCE | Attending: Student in an Organized Health Care Education/Training Program | Primary: Student in an Organized Health Care Education/Training Program

## 2022-12-12 NOTE — Unmapped (Signed)
Signed order faxed to [facility]. Confirmation received.
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
## 2022-12-15 NOTE — Unmapped (Signed)
I saw and evaluated the patient and discussed the assessment and plan with the resident. I participated in or provided the required supervision for the key and critical portions of the service.  I reviewed the resident???s note.  I independently reviewed the image(s) and agree with the interpretation and I agree with the resident???s findings and plan as documented in the resident's note. [place], MD

## 2022-12-29 NOTE — Unmapped (Signed)
Infusion outside of [facility]/Non-hospital based facility  ICD and Dx: M13.80 RA  Insurance: Medicare  Referral: [facility]  Med: Infliximab  Premeds: Tyl/Ben  Labs: CBC/D, AST, ALT, Cr q12 weeks  IV access: PIV  Start date: 01/06/2023

## 2023-01-04 DIAGNOSIS — I6523 Occlusion and stenosis of bilateral carotid arteries: Principal | ICD-10-CM

## 2023-01-04 DIAGNOSIS — E11319 Type 2 diabetes mellitus with unspecified diabetic retinopathy without macular edema: Principal | ICD-10-CM

## 2023-01-04 DIAGNOSIS — I10 Essential (primary) hypertension: Principal | ICD-10-CM

## 2023-01-04 DIAGNOSIS — Z794 Long term (current) use of insulin: Principal | ICD-10-CM

## 2023-01-04 MED ORDER — AMLODIPINE 5 MG TABLET
ORAL_TABLET | ORAL | 1 refills | 90 days | Status: CP
Start: 2023-01-04 — End: 2024-01-04
  Filled 2023-01-13: qty 90, 90d supply, fill #0

## 2023-01-04 MED ORDER — ATORVASTATIN 40 MG TABLET
ORAL_TABLET | ORAL | 1 refills | 90 days | Status: CP
Start: 2023-01-04 — End: 2024-01-04
  Filled 2023-01-13: qty 90, 90d supply, fill #0

## 2023-01-04 MED ORDER — PEN NEEDLE, DIABETIC 32 GAUGE X 5/32" (4 MM)
Freq: Two times a day (BID) | SUBCUTANEOUS | 1 refills | 100 days | Status: CP
Start: 2023-01-04 — End: 2024-01-04

## 2023-01-04 NOTE — Unmapped (Signed)
[facility] Refill Coordination Note    Specialty Lite Medication(s) to be Shipped:   Trulicity    Other medication(s) to be shipped:  Pen Needle, Jardiance, Leucovorin     [name], DOB: 1960/05/01  Phone: [phone] (home)       All above HIPAA information was verified with patient's family member, Daughter.     Was a Nurse, learning disability used for this call? No    Changes to medications: Haja reports no changes at this time.  Changes to insurance: No      REFERRAL TO PHARMACIST     Referral to the pharmacist: Not needed      [facility]     Shipping address confirmed in Epic.     Delivery Scheduled: Yes, Expected medication delivery date: 01/06/23.     Medication will be delivered via Same Day Courier to the prescription address in Epic [place].    Willette Pa   [facility] Specialty Technician

## 2023-01-06 MED FILL — ULTICARE PEN NEEDLE 32 GAUGE X 5/32" (4 MM): SUBCUTANEOUS | 30 days supply | Qty: 100 | Fill #0

## 2023-01-06 MED FILL — JARDIANCE 25 MG TABLET: ORAL | 90 days supply | Qty: 90 | Fill #2

## 2023-01-06 MED FILL — TRULICITY 1.5 MG/0.5 ML SUBCUTANEOUS PEN INJECTOR: SUBCUTANEOUS | 28 days supply | Qty: 2 | Fill #1

## 2023-01-09 MED ORDER — PEN NEEDLE, DIABETIC 32 GAUGE X 5/32" (4 MM)
Freq: Two times a day (BID) | 1 refills | 100 days | Status: CP
Start: 2023-01-09 — End: 2024-01-09

## 2023-01-13 MED FILL — LOSARTAN 25 MG TABLET: ORAL | 90 days supply | Qty: 90 | Fill #2

## 2023-01-31 NOTE — Unmapped (Signed)
[facility] Specialty and Home Delivery Pharmacy Refill Coordination Note    Specialty Lite Medication(s) to be Shipped:   Trulicity    Other medication(s) to be shipped:  [name], DOB: 07-29-1960  Phone: [phone] (home)       All above HIPAA information was verified with patient's family member, daughter.     Was a Nurse, learning disability used for this call? No    Changes to medications: [name] reports no changes at this time.  Changes to insurance: No      REFERRAL TO PHARMACIST     Referral to the pharmacist: Not needed      [facility]     Shipping address confirmed in Epic.     Delivery Scheduled: Yes, Expected medication delivery date: 11/08.     Medication will be delivered via Same Day Courier to the prescription address in Epic WAM.    Antonietta Barcelona   [facility] Specialty and Home Delivery Pharmacy Specialty Technician

## 2023-02-02 ENCOUNTER — Ambulatory Visit: Admit: 2023-02-02 | Discharge: 2023-02-03 | Payer: MEDICARE

## 2023-02-02 DIAGNOSIS — M1380 Other specified arthritis, unspecified site: Principal | ICD-10-CM

## 2023-02-02 DIAGNOSIS — Z794 Long term (current) use of insulin: Principal | ICD-10-CM

## 2023-02-02 DIAGNOSIS — E66811 Obesity (BMI 30.0-34.9): Principal | ICD-10-CM

## 2023-02-02 DIAGNOSIS — R051 Acute cough: Principal | ICD-10-CM

## 2023-02-02 DIAGNOSIS — R059 Cough, unspecified type: Principal | ICD-10-CM

## 2023-02-02 DIAGNOSIS — Z1211 Encounter for screening for malignant neoplasm of colon: Principal | ICD-10-CM

## 2023-02-02 DIAGNOSIS — Z1231 Encounter for screening mammogram for malignant neoplasm of breast: Principal | ICD-10-CM

## 2023-02-02 DIAGNOSIS — E11319 Type 2 diabetes mellitus with unspecified diabetic retinopathy without macular edema: Principal | ICD-10-CM

## 2023-02-02 DIAGNOSIS — K219 Gastro-esophageal reflux disease without esophagitis: Principal | ICD-10-CM

## 2023-02-02 DIAGNOSIS — E782 Mixed hyperlipidemia: Principal | ICD-10-CM

## 2023-02-02 DIAGNOSIS — I10 Essential (primary) hypertension: Principal | ICD-10-CM

## 2023-02-02 MED ORDER — OMEPRAZOLE 20 MG CAPSULE,DELAYED RELEASE
ORAL_CAPSULE | Freq: Every day | ORAL | 3 refills | 90 days | Status: CP
Start: 2023-02-02 — End: 2024-02-02
  Filled 2023-02-03: qty 90, 90d supply, fill #0

## 2023-02-02 MED ORDER — FLUTICASONE PROPIONATE 50 MCG/ACTUATION NASAL SPRAY,SUSPENSION
Freq: Two times a day (BID) | NASAL | 1 refills | 180 days | Status: CP
Start: 2023-02-02 — End: 2024-02-02
  Filled 2023-02-03: qty 16, 30d supply, fill #0

## 2023-02-02 MED ORDER — EMPAGLIFLOZIN 25 MG TABLET
ORAL_TABLET | Freq: Every day | ORAL | 3 refills | 90 days | Status: CP
Start: 2023-02-02 — End: ?
  Filled 2023-04-18: qty 90, 90d supply, fill #0

## 2023-02-02 MED ORDER — METFORMIN 1,000 MG TABLET
ORAL_TABLET | Freq: Two times a day (BID) | ORAL | 3 refills | 90 days | Status: CP
Start: 2023-02-02 — End: 2024-02-02
  Filled 2023-02-03: qty 180, 90d supply, fill #0

## 2023-02-02 MED ORDER — BENZONATATE 100 MG CAPSULE
ORAL_CAPSULE | Freq: Every evening | ORAL | 0 refills | 20 days | Status: CP | PRN
Start: 2023-02-02 — End: ?
  Filled 2023-02-03: qty 20, 20d supply, fill #0

## 2023-02-02 NOTE — Unmapped (Signed)
Assessment and Plan:     Anne Barber was seen today for follow-up.    Diagnoses and all orders for this visit:    Type 2 diabetes mellitus with retinopathy, with long-term current use of insulin, macular edema presence unspecified, unspecified laterality, unspecified retinopathy severity (CMS-HCC)  Mixed hyperlipidemia  Obesity (BMI 30.0-34.9)          -     50 U lantus, 1000 mg metformin BID, 1.5 mg Turlicity, 25 mg jardiance, 40 mg atorvastatin        -     A1c 6.3 today (7.5-6.7), LDL 51 TC 107        -     No hypoglycemia  -     POCT glycosylated hemoglobin (Hb A1C)  -     empagliflozin (JARDIANCE) 25 mg tablet; Take 1 tablet (25 mg total) by mouth daily.    Gastroesophageal reflux disease, unspecified whether esophagitis present          -     Small and frequent meals, avoidance of trigger foods  -     omeprazole (PRILOSEC) 20 MG capsule; Take 1 capsule (20 mg total) by mouth daily.    Primary hypertension           - Adherent with 5 mg amlodipine, 81 mg ASA, 25 mg losartan         - Cr 0.67, 0.79, 0.63.         - Has cardiology follow up: 10/13/23    Cough, unspecified type          -   No signs of secondary bacterial infection warranting abx or oral steroids        -   Rx for nasal spray, anti-histamine, cough suppressants        -   Fluids, rest, home care advice/reassurance    Screening mammogram for breast cancer          -     Last 5/23-negative  -     Mammography screening bilateral; Future    Screening for colon cancer    -     Colonoscopy; Future    Seronegative inflammatory arthritis          - Last seen by Rheumatology 12/05/22: Increase infliximab 5 mg q8-->q6 weeks at [facility]        - Continue methotrexate 20 mg weekly with leucovorin    HS: Managed by dermatology, last visit 08/27/22  OAB: Last visit 12/02/22:  trial of vibegron 75mg  daily, has follow up 03/17/23  Glaucoma, cataract, hemorrhage in R eye: managed by [facility]    Other orders  -     metFORMIN (GLUCOPHAGE) 1000 MG tablet; Take 1 tablet (1,000 mg total) by mouth 2 (two) times a day with meals.  -     INFLUENZA VACCINE IIV3(IM)(PF)6 MOS UP        Barriers to recommended plan: None identified    Return for 3-4 months (40 min).      Subjective:     HPI: Anne Barber is a 62 y.o. female here for Follow-up (Cough for over 4 weeks not improving. ).    Follow up on chronic conditions:    Runny eyes, runny nose, cough: Started ~ 4 weeks ago, initially with runny nose, watery eyes  Cough worse at night--congestion in chest, throat sore. Thinks cough getting better, no breathing problems, thinks sore chest wall muscles due to coughing  Taking a lot of  herbal tea with fruit, would like something for cough  NO fever, no chills  Has used steroid nasal spray in past-currently, not using    DM:  50 U lantus, 1000 mg metformin BID, 1.5 mg Turlicity, 25 mg jardiance, 40 mg atorvastatin  A1c 6.3 today (7.5-6.7), LDL 51 TC 107  Blood sugars highest 157, no hypoglycemia    HTN, hx of chest pains:  Last seen by cardiology 10/05/22--Zio/echo: normal  Adherent with 5 mg amlodipine, 81 mg ASA, 25 mg losartan  Cr 0.67, 0.79, 0.63.    Seronegative inflammatory arthritis:  Last seen by Rheumatology 12/05/22: Increase infliximab 5 mg q8-->q6 weeks at [facility]  - Continue methotrexate 20 mg weekly with leucovorin    HS: Managed by dermatology, last visit 08/27/22  OAB: Last visit 12/02/22:  trial of vibegron 75mg  daily, has follow up 03/17/23--concern for OSA  Glaucoma, cataract, hemorrhage in R eye: managed by [facility]  Chronic pain-not seen by pain clinic since 12/23    Agreeable for flu shot  I have reviewed past medical, surgical, medications, allergies, social and family histories today and updated them in Epic where appropriate.    ROS:     PHQ-9 PHQ-9 TOTAL SCORE   02/01/2022  10:00 AM 2   01/29/2021   1:00 PM 9   01/20/2020   5:00 PM 7   11/05/2019   1:00 PM 11      GAD7 Total Score GAD-7 Total Score   02/01/2022  10:00 AM 0   01/29/2021 1:00 PM 3   01/20/2020   6:00 PM 7        Review of systems negative unless otherwise noted as per HPI.      Objective:     Vitals:    02/02/23 1545   BP: 122/68   Pulse: 88   Temp: 36.9 ??C (98.4 ??F)   SpO2: 98%     Body mass index is 36.17 kg/m??.    Physical Exam  Vitals and nursing note reviewed.   Constitutional:       Appearance: Normal appearance.   HENT:      Head: Normocephalic and atraumatic.      Right Ear: Tympanic membrane, ear canal and external ear normal.      Left Ear: Tympanic membrane, ear canal and external ear normal.      Nose: Nose normal.      Mouth/Throat:      Mouth: Mucous membranes are moist.      Pharynx: Oropharynx is clear.   Cardiovascular:      Rate and Rhythm: Normal rate and regular rhythm.      Pulses: Normal pulses.      Heart sounds: Normal heart sounds.   Pulmonary:      Effort: Pulmonary effort is normal. No respiratory distress.      Breath sounds: Normal breath sounds. No stridor. No wheezing, rhonchi or rales.   Chest:      Chest wall: No tenderness.   Musculoskeletal:         General: Normal range of motion.      Cervical back: Normal range of motion and neck supple.   Skin:     General: Skin is warm.      Capillary Refill: Capillary refill takes less than 2 seconds.   Neurological:      General: No focal deficit present.      Mental Status: She is alert and oriented to person, place, and time. Mental status  is at baseline.   Psychiatric:         Mood and Affect: Mood normal.         Behavior: Behavior normal.         Thought Content: Thought content normal.         Judgment: Judgment normal.            Medication adherence and barriers to the treatment plan have been addressed. Opportunities to optimize healthy behaviors have been discussed. Patient / caregiver voiced understanding.   I personally spent 35 minutes face-to-face and non-face-to-face in the care of this patient, which includes all pre, intra, and post visit time on the date of service.  Cleon Dew, DNP, FNP-C  [facility]  [phone] (F)    Note - This record has been created using [employer]. Chart creation errors have been sought, but may not always have been located. Such creation errors do not reflect on the standard of medical care.

## 2023-02-03 MED FILL — TRULICITY 1.5 MG/0.5 ML SUBCUTANEOUS PEN INJECTOR: SUBCUTANEOUS | 28 days supply | Qty: 2 | Fill #2

## 2023-02-03 MED FILL — LEUCOVORIN CALCIUM 5 MG TABLET: ORAL | 84 days supply | Qty: 12 | Fill #1

## 2023-02-03 NOTE — Unmapped (Signed)
Colonoscopia/colonoscopy: Llame/Please call: [phone] to schedule colonoscopy.  Thank you.     Mammogram/mamografia: please call [phone].    [facility]  [address]  [place], [place] [address]      [facility]  [address]  [place], [place] [address]

## 2023-02-20 DIAGNOSIS — I6523 Occlusion and stenosis of bilateral carotid arteries: Principal | ICD-10-CM

## 2023-02-20 DIAGNOSIS — R051 Acute cough: Principal | ICD-10-CM

## 2023-02-20 MED ORDER — BENZONATATE 100 MG CAPSULE
ORAL_CAPSULE | Freq: Every evening | ORAL | 0 refills | 20 days | Status: CP | PRN
Start: 2023-02-20 — End: ?

## 2023-02-20 MED ORDER — ASPIRIN 81 MG TABLET,DELAYED RELEASE
ORAL_TABLET | Freq: Every day | ORAL | 3 refills | 90 days | Status: CP
Start: 2023-02-20 — End: 2024-02-20
  Filled 2023-03-30: qty 90, 90d supply, fill #0

## 2023-02-20 NOTE — Unmapped (Signed)
[facility] Specialty and Home Delivery Pharmacy Refill Coordination Note    Specialty Lite Medication(s) to be Shipped:   Trulicity    Other medication(s) to be shipped: benzonatate 100 MG capsule (TESSALON), fluticasone propionate 50 mcg/actuation nasal spray (FLONASE), aspirin 81 MG tablet (ECOTRIN), dorzolamide-timolol 22.3-6.8 mg/mL ophthalmic solution (COSOPT)     Anne Barber, DOB: 09-09-1960  Phone: [phone] (home)       All above HIPAA information was verified with patient's family member, Education officer, environmental.     Was a Nurse, learning disability used for this call? No    Changes to medications: [name] reports no changes at this time.  Changes to insurance: No      REFERRAL TO PHARMACIST     Referral to the pharmacist: Not needed      [facility]     Shipping address confirmed in Epic.     Delivery Scheduled: Yes, Expected medication delivery date: 03/02/23.     Medication will be delivered via Same Day Courier to the prescription address in Epic WAM.    [name]   [facility] Specialty and Home Delivery Pharmacy Specialty Technician

## 2023-02-20 NOTE — Unmapped (Signed)
Patient is requesting the following refill  Requested Prescriptions     Pending Prescriptions Disp Refills    benzonatate (TESSALON) 100 MG capsule 20 capsule 0     Sig: Take 1 capsule (100 mg total) by mouth nightly as needed for cough.       Recent Visits  Date Type Provider Dept   02/02/23 Office Visit Deneise Lever, FNP [place] [facility] [address] At [facility]   09/26/22 Office Visit Johnn Hai, Loleta Rose, FNP [place] [facility] [address] At [facility]   06/02/22 Office Visit Johnn Hai, Loleta Rose, FNP [place] [facility] [address] At [facility]   Showing recent visits within past 365 days and meeting all other requirements  Future Appointments  No visits were found meeting these conditions.  Showing future appointments within next 365 days and meeting all other requirements

## 2023-02-20 NOTE — Unmapped (Signed)
Patient is requesting the following refill  Requested Prescriptions     Pending Prescriptions Disp Refills    aspirin (ECOTRIN) 81 MG tablet 90 tablet 3     Sig: Take 1 tablet (81 mg total) by mouth daily.       Recent Visits  Date Type Provider Dept   02/02/23 Office Visit Deneise Lever, FNP [place] [facility] [address] [facility]   09/26/22 Office Visit Johnn Hai, Loleta Rose, FNP [place] [facility] [address] At [facility]   06/02/22 Office Visit Johnn Hai, Loleta Rose, FNP [place] [facility] [address] At [facility]   Showing recent visits within past 365 days and meeting all other requirements  Future Appointments  No visits were found meeting these conditions.  Showing future appointments within next 365 days and meeting all other requirements

## 2023-03-02 ENCOUNTER — Ambulatory Visit: Admit: 2023-03-02 | Discharge: 2023-03-03 | Payer: MEDICARE

## 2023-03-02 DIAGNOSIS — Z1231 Encounter for screening mammogram for malignant neoplasm of breast: Principal | ICD-10-CM

## 2023-03-02 MED FILL — FLUTICASONE PROPIONATE 50 MCG/ACTUATION NASAL SPRAY,SUSPENSION: NASAL | 30 days supply | Qty: 16 | Fill #1

## 2023-03-02 MED FILL — DORZOLAMIDE 22.3 MG-TIMOLOL 6.8 MG/ML EYE DROPS: OPHTHALMIC | 50 days supply | Qty: 10 | Fill #0

## 2023-03-02 MED FILL — TRULICITY 1.5 MG/0.5 ML SUBCUTANEOUS PEN INJECTOR: SUBCUTANEOUS | 28 days supply | Qty: 2 | Fill #3

## 2023-03-10 NOTE — Unmapped (Signed)
Form for incontinence supplies reviewed, signed.  Copies provided to MA who will fax to DME company

## 2023-03-17 ENCOUNTER — Ambulatory Visit: Admit: 2023-03-17 | Discharge: 2023-03-18 | Payer: MEDICARE

## 2023-03-17 DIAGNOSIS — N3281 Overactive bladder: Principal | ICD-10-CM
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
## 2023-03-17 NOTE — Unmapped (Addendum)
[place] a la farmacia al n??mero que aparece para obtener un resurtido de la aspirina para beb??s  Phone: [phone]     Contin??e con su vibegron 75 mg al d??a     Advice worker con nosotros en un a??o    Tel??fono de recepci??n de [place] [facility] [phone]    N??mero de tel??fono de las enfermeras de Uroginecolog??a [employer] [phone]

## 2023-03-27 DIAGNOSIS — Z794 Long term (current) use of insulin: Principal | ICD-10-CM

## 2023-03-27 DIAGNOSIS — E11319 Type 2 diabetes mellitus with unspecified diabetic retinopathy without macular edema: Principal | ICD-10-CM

## 2023-03-27 MED ORDER — MOXIFLOXACIN 0.5 % EYE DROPS
Freq: Four times a day (QID) | OPHTHALMIC | 0 refills | 7.00 days | Status: CP
Start: 2023-03-27 — End: 2023-04-03
  Filled 2023-03-30: qty 3, 15d supply, fill #0

## 2023-03-27 MED ORDER — TRULICITY 1.5 MG/0.5 ML SUBCUTANEOUS PEN INJECTOR
SUBCUTANEOUS | 3 refills | 28.00 days | Status: CP
Start: 2023-03-27 — End: 2024-03-25
  Filled 2023-03-30: qty 2, 28d supply, fill #0

## 2023-03-27 NOTE — Unmapped (Signed)
Patient is requesting the following refill  Requested Prescriptions     Pending Prescriptions Disp Refills    moxifloxacin (VIGAMOX) 0.5 % ophthalmic solution 3 mL 0     Sig: Administer 1 drop into the left eye Four (4) times a day. for 7 days       Recent Visits  Date Type Provider Dept   02/02/23 Office Visit Deneise Lever, FNP [place] [facility] [address] [facility]   09/26/22 Office Visit Johnn Hai, Loleta Rose, FNP [place] [facility] [address] [facility]   06/02/22 Office Visit Johnn Hai, Loleta Rose, FNP [place] [facility] [address] [facility]   Showing recent visits within past 365 days and meeting all other requirements  Future Appointments  No visits were found meeting these conditions.  Showing future appointments within next 365 days and meeting all other requirements

## 2023-03-27 NOTE — Unmapped (Signed)
Patient is requesting the following refill  Requested Prescriptions     Pending Prescriptions Disp Refills    dulaglutide (TRULICITY) 1.5 mg/0.5 mL PnIj 2 mL 3     Sig: Inject 0.5 mL (1.5 mg total) under the skin every seven (7) days.       Recent Visits  Date Type Provider Dept   02/02/23 Office Visit Deneise Lever, FNP [place] [facility] [address] [facility]   09/26/22 Office Visit Johnn Hai, Loleta Rose, FNP [place] [facility] [address] [facility]   06/02/22 Office Visit Johnn Hai, Loleta Rose, FNP [place] [facility] [address] [facility]   Showing recent visits within past 365 days and meeting all other requirements  Future Appointments  No visits were found meeting these conditions.  Showing future appointments within next 365 days and meeting all other requirements       Labs: A1c:   HGB A1C, POC (%)   Date Value   11/16/2016 11.3 (H)     Hemoglobin A1C (%)   Date Value   02/02/2023 6.3 (A)    Creatinine:   Creatinine Whole Blood, POC (mg/dL)   Date Value   69/62/9528 0.8     Creatinine (mg/dL)   Date Value   41/32/4401 0.67

## 2023-03-27 NOTE — Unmapped (Signed)
[facility] Specialty and Home Delivery Pharmacy Refill Coordination Note    Specialty Lite Medication(s) to be Shipped:   Trulicity    Other medication(s) to be shipped:  aspirin,moxifloxacin      [name], DOB: October 12, 1960  Phone: [phone] (home)       All above HIPAA information was verified with patient's family member, daughter.     Was a Nurse, learning disability used for this call? No    Changes to medications: Donya reports no changes at this time.  Changes to insurance: No      REFERRAL TO PHARMACIST     Referral to the pharmacist: Not needed      [facility]     Shipping address confirmed in Epic.     Delivery Scheduled: Yes, Expected medication delivery date: 01/02.     Medication will be delivered via Same Day Courier to the prescription address in Epic WAM.    Antonietta Barcelona   [facility] Specialty and Home Delivery Pharmacy Specialty Technician

## 2023-03-28 NOTE — Unmapped (Signed)
Spoke to [place] NP. Patient has been having sinus pain, HA, muscle aches and worsening glaucoma on and off lately.  Vital WNL.Currently not experiencing any of these symptoms. Patient was seen by provider at [facility] and they decided to proceed with infusion. Provider notified

## 2023-03-28 NOTE — Unmapped (Addendum)
Error

## 2023-03-28 NOTE — Unmapped (Signed)
Luisa Hart- NP @ [facility]-[phone], said pt is having Remicade infusion and has question please call asap.Thanks

## 2023-04-03 NOTE — Unmapped (Signed)
Colonoscopy  Procedure #1     Procedure #2   [id number]  MRN   GENERIC   Endoscopist     Is the patient's health insurance [employer], [employer], [facility]), [employer] [place], [employer], or [place]?     Urgent procedure     Are you pregnant?     Are you in the process of scheduling or awaiting results of a heart ultrasound, stress test, or catheterization to evaluate new or worsening chest pain, dizziness, or shortness of breath?     Do you take: Plavix (clopidogrel), Coumadin (warfarin), Lovenox (enoxaparin), Pradaxa (dabigatran), Effient (prasugrel), Xarelto (rivaroxaban), Eliquis (apixaban), Pletal (cilostazol), or Brilinta (ticagrelor)?          Did ordering provider indicate how long to hold this medication in the order comments?          Which of the above medications are you taking?          What is the name of the medical practice that manages this medication?          What is the name of the medical provider who manages this medication?     Do you have hemophilia, von Willebrand disease, or low platelets?     Do you have a pacemaker or implanted cardiac defibrillator?     Has a [facility] provider specified the location(s)?     Which location(s) did the [facility] provider specify?          [facility]          [place]          [facility]     Do you see a liver specialist for chronic liver disease?     Is the procedure indication for variceal screening?     Is procedure indication for variceal banding (this does NOT include variceal screening)?     Have you had a heart attack, stroke or heart stent placement within the past 6 months?     Month of event     Year of event (ONLY ENTER LAST 2 DIGITS)        5  Height (feet)   1  Height (inches)   187  Weight (pounds)   35.3  BMI          Did the ordering provider specify a bowel prep?          What bowel prep was specified?     Do you have an ostomy (bag on your stomach that collects your stool)?          Is it an ileostomy?          Is it a colostomy?          Patient doesn't know.     Do you have chronic kidney disease?     Do you have chronic constipation or have you had poor quality bowel preps for past colonoscopies?     Do you have Crohn's disease or ulcerative colitis?     Have you had weight loss surgery?        TRUE  When you walk around your house or grocery store, do you have to stop and rest due to shortness of breath, chest pain, or light-headedness?     Do you ever use supplemental oxygen?     Have you been hospitalized for cirrhosis of the liver or heart failure in the last 12 months?     Have you been treated for mouth or  throat cancer with radiation or surgery?     Have you been told that it is difficult for doctors to insert a breathing tube in you during anesthesia?     Have you had a heart or lung transplant?          Are you on dialysis?     Do you have cirrhosis of the liver?     Do you have myasthenia gravis?     Is the patient a prisoner?   ################# ## ###################################################################################################################   MRN:  [id number]   Anticoag Review  No   Nurse Triage  No   GI clinic consult  No   Procedure(s):  Colonoscopy     0   Endoscopist:  GENERIC    Urgent:  No   Prep:  Nulytely Prep                  --------------------------- --- ----------------------------------------------------------------------------------------------------------------------------------------------------------------------------   [facility]:  [facility]                  Requested Locations:              ################# ## ###################################################################################################################

## 2023-04-10 MED ORDER — PEG 3350-ELECTROLYTES 236 GRAM-22.74 GRAM-6.74 GRAM-5.86 GRAM SOLUTION
0 refills | 0.00 days | Status: CP
Start: 2023-04-10 — End: ?

## 2023-04-18 MED FILL — ATORVASTATIN 40 MG TABLET: ORAL | 90 days supply | Qty: 90 | Fill #1

## 2023-04-18 MED FILL — LOSARTAN 25 MG TABLET: ORAL | 90 days supply | Qty: 90 | Fill #3

## 2023-04-18 MED FILL — OMEPRAZOLE 20 MG CAPSULE,DELAYED RELEASE: ORAL | 90 days supply | Qty: 90 | Fill #1

## 2023-04-24 DIAGNOSIS — E11319 Type 2 diabetes mellitus with unspecified diabetic retinopathy without macular edema: Principal | ICD-10-CM

## 2023-04-24 DIAGNOSIS — Z794 Long term (current) use of insulin: Principal | ICD-10-CM

## 2023-04-24 MED ORDER — LANTUS SOLOSTAR U-100 INSULIN 100 UNIT/ML (3 ML) SUBCUTANEOUS PEN
Freq: Every evening | SUBCUTANEOUS | 2 refills | 90.00 days | Status: CP
Start: 2023-04-24 — End: 2024-04-23
  Filled 2023-04-28: qty 45, 90d supply, fill #0

## 2023-04-24 NOTE — Unmapped (Signed)
Patient is requesting the following refill  Requested Prescriptions     Pending Prescriptions Disp Refills    insulin glargine (LANTUS SOLOSTAR U-100 INSULIN) 100 unit/mL (3 mL) injection pen 45 mL 1     Sig: Inject 0.5 mL (50 Units total) under the skin nightly.       Recent Visits  Date Type Provider Dept   02/02/23 Office Visit Deneise Lever, FNP [facility] [address] [facility]   09/26/22 Office Visit Johnn Hai, Loleta Rose, FNP [facility] [address] [facility]   06/02/22 Office Visit Johnn Hai, Loleta Rose, FNP [facility] [address] [facility]   Showing recent visits within past 365 days and meeting all other requirements  Future Appointments  Date Type Provider Dept   05/17/23 Appointment Deneise Lever, FNP [facility] [address] [facility]   Showing future appointments within next 365 days and meeting all other requirements       Labs: A1c:   HGB A1C, POC (%)   Date Value   11/16/2016 11.3 (H)     Hemoglobin A1C (%)   Date Value   02/02/2023 6.3 (A)

## 2023-04-26 NOTE — Unmapped (Signed)
Anne Barber requested a refill of their Trulicity via IVR/Web. The [facility] has scheduled delivery per the patients request via Same Day Courier to be delivered to their prescription address on 04/28/23.

## 2023-04-28 MED FILL — TRULICITY 1.5 MG/0.5 ML SUBCUTANEOUS PEN INJECTOR: SUBCUTANEOUS | 84 days supply | Qty: 6 | Fill #1

## 2023-05-12 NOTE — Unmapped (Signed)
 Copied from CRM #[id number]. Topic: Access To Clinicians - Req Clinic Call Back  >> May 12, 2023 10:51 AM Larna Daughters wrote:  Appointment Related:     Patient daughter [name] called stating that patient got a VM saying she needs to reschedule her appt for Friday the 19th but I am only seeing where cardiology needs rescheduling. 2/19 is a Wed and 7/18 is a Friday.     No notes in chart regarding which appt. Please give daughter a call back regarding this      The patient Daughter preferred contact: Cell Phone Routine callback turnaround time: 24-48 business hours. (Caller Notified) [phone]

## 2023-05-15 ENCOUNTER — Ambulatory Visit
Admit: 2023-05-15 | Discharge: 2023-05-15 | Payer: MEDICARE | Attending: Student in an Organized Health Care Education/Training Program | Primary: Student in an Organized Health Care Education/Training Program

## 2023-05-15 MED ORDER — METHOTREXATE SODIUM (CONTAINS PRESERVATIVES) 25 MG/ML INJECTION SOLUTION
SUBCUTANEOUS | 2 refills | 70.00 days | Status: CP
Start: 2023-05-15 — End: ?

## 2023-05-15 MED ORDER — SYRINGE WITH NEEDLE 1 ML 27 X 1/2"
2 refills | 0.00 days | Status: CP
Start: 2023-05-15 — End: ?
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
## 2023-05-15 NOTE — Unmapped (Addendum)
 Let's change to injectable methotrexate- this will allow for higher doses and better control of your arthritis. We will also increase your dose of infliximab with each infusion

## 2023-05-15 NOTE — Unmapped (Signed)
 Spanish Interpreter ID [id number]

## 2023-05-16 DIAGNOSIS — M1380 Other specified arthritis, unspecified site: Principal | ICD-10-CM

## 2023-05-16 DIAGNOSIS — Z79899 Other long term (current) drug therapy: Principal | ICD-10-CM

## 2023-05-16 NOTE — Unmapped (Signed)
-----   Message from Candise Bowens, MD sent at 05/16/2023 12:14 PM EST -----  I would like to increase her Infliximab dose to 7.5mg  every 6 weeks.    Thanks,    Clifton Custard

## 2023-05-16 NOTE — Unmapped (Signed)
 [facility] Refill Coordination Note    Specialty Lite Medication(s) to be Shipped:   methotrexate    Other medication(s) to be shipped:  syringes     Anne Barber, DOB: [birth year]  Phone: [phone] (home)       All above HIPAA information was verified with patient's family member, daughter.     Was a Nurse, learning disability used for this call? No    Changes to medications: Anne Barber reports no changes at this time.  Changes to insurance: No      REFERRAL TO PHARMACIST     Referral to the pharmacist: Not needed      [facility]     Shipping address confirmed in Epic.     Delivery Scheduled: Yes, Expected medication delivery date: 05/19/2023.     Medication will be delivered via Same Day Courier to the prescription address in Epic WAM.    [name]   [facility] Delivery Pharm[facility] Specialty Technician

## 2023-05-16 NOTE — Unmapped (Signed)
 Message sent to [facility] for new orders.

## 2023-05-17 ENCOUNTER — Ambulatory Visit: Admit: 2023-05-17 | Discharge: 2023-05-18 | Payer: MEDICARE

## 2023-05-17 DIAGNOSIS — N3281 Overactive bladder: Principal | ICD-10-CM

## 2023-05-17 DIAGNOSIS — E7849 Other hyperlipidemia: Principal | ICD-10-CM

## 2023-05-17 DIAGNOSIS — M2550 Pain in unspecified joint: Principal | ICD-10-CM

## 2023-05-17 DIAGNOSIS — M792 Neuralgia and neuritis, unspecified: Principal | ICD-10-CM

## 2023-05-17 DIAGNOSIS — I6523 Occlusion and stenosis of bilateral carotid arteries: Principal | ICD-10-CM

## 2023-05-17 DIAGNOSIS — M797 Fibromyalgia: Principal | ICD-10-CM

## 2023-05-17 DIAGNOSIS — L732 Hidradenitis suppurativa: Principal | ICD-10-CM

## 2023-05-17 DIAGNOSIS — Z794 Long term (current) use of insulin: Principal | ICD-10-CM

## 2023-05-17 DIAGNOSIS — M1380 Other specified arthritis, unspecified site: Principal | ICD-10-CM

## 2023-05-17 DIAGNOSIS — F418 Other specified anxiety disorders: Principal | ICD-10-CM

## 2023-05-17 DIAGNOSIS — I10 Essential (primary) hypertension: Principal | ICD-10-CM

## 2023-05-17 DIAGNOSIS — E11319 Type 2 diabetes mellitus with unspecified diabetic retinopathy without macular edema: Principal | ICD-10-CM

## 2023-05-17 MED ORDER — LOSARTAN 25 MG TABLET
ORAL_TABLET | Freq: Every day | ORAL | 3 refills | 90 days | Status: CP
Start: 2023-05-17 — End: ?
  Filled 2023-08-01: qty 90, 90d supply, fill #0

## 2023-05-17 MED ORDER — ATORVASTATIN 40 MG TABLET
ORAL_TABLET | Freq: Every day | ORAL | 3 refills | 90 days | Status: CP
Start: 2023-05-17 — End: 2024-05-16
  Filled 2023-08-01: qty 90, 90d supply, fill #0

## 2023-05-17 MED ORDER — INSULIN GLARGINE-YFGN (U-100) 100 UNIT/ML (3 ML) SUBCUTANEOUS PEN
Freq: Every evening | SUBCUTANEOUS | 2 refills | 0.00 days | Status: CP
Start: 2023-05-17 — End: 2024-05-16

## 2023-05-17 MED ORDER — DULOXETINE 60 MG CAPSULE,DELAYED RELEASE
ORAL_CAPSULE | Freq: Every day | ORAL | 3 refills | 90.00 days | Status: CP
Start: 2023-05-17 — End: 2024-05-16

## 2023-05-17 NOTE — Unmapped (Signed)
 The patient reports they are physically located in [place] and is currently: at home. I conducted a phone visit.  I spent 14 minutes on the phone call with the patient on the date of service .        Assessment and Plan:     Diagnoses and all orders for this visit:    Type 2 diabetes mellitus with retinopathy, with long-term current use of insulin, macular edema presence unspecified, unspecified laterality, unspecified retinopathy severity (CMS-HCC)          -     Given fasting blood sugars 60-80, decreased lantus from 50 U to 45 U. Continue: 1000 mg metformin BID, 1.5 mg Trulicity, 25 mg jardiance.        -     Advised if fastings continue to be low on new dose of lantus--can further decrease lantus        -     Getting regular eye care at [facility]  -     Hemoglobin A1c; Future  -     atorvastatin (LIPITOR) 40 MG tablet; Take 1 tablet (40 mg total) by mouth daily.  -     insulin glargine-yfgn (SEMGLEE,INSULIN GLARG-YFGN,PEN) 100 unit/mL (3 mL) InPn; Inject 45 Units under the skin nightly.  -     losartan (COZAAR) 25 MG tablet; Take 1 tablet (25 mg total) by mouth daily.    Bilateral carotid artery stenosis  Primary hypertension  Other hyperlipidemia          -     Pt adherent with 5 mg amlodipine, 81 mg ASA, 25 mg losartan, 40 mg atorvastatin; Cr 0.67, 0.79, 0.63. LDL 51 TC 107        -     DASH diet, activity as able, cardiology follow-up: 10/20/23  -     atorvastatin (LIPITOR) 40 MG tablet; Take 1 tablet (40 mg total) by mouth daily.    Neuralgia  Polyarthralgia  Fibromyalgia          -     Gentle activity, continue duloxetine, topical NSAID gel, last visit to pain clinic 3/24  -     DULoxetine (CYMBALTA) 60 MG capsule; Take 1 capsule (60 mg total) by mouth daily.    Hidradenitis suppurativa          -  Followed by dermatology--last visit 5/24    Seronegative inflammatory arthritis         - Followed by rheumatology, last visit 05/15/23       - Infliximab 5 mg-->7.5 q6 weeks at [facility]       - Increase PO methotrexate 20 mg--> subcutaneous 25mg  weekly with leucovorin 5mg  weekly    Depression with anxiety         -  Mental health stable on duloxetine    OAB (overactive bladder)          - Managed by Uro-GYN, adherent with  vibegron--feels helpful for symptoms.        - Last visit 12/24    Barriers to recommended plan: financial    Return for 3-4 months .      Subjective:     HPI: Anne Barber is a 63 y.o. female here for No chief complaint on file..    Follow up on chronic conditions:    Type 2 DM with retinopathy, obesity, high cholesterol:  Pt adherent with 50 U lantus, 1000 mg metformin BID, 1.5 mg Trulicity, 25 mg jardiance, 40 mg atorvastatin  Last  A1c 6.3 (11/24) (7.5-6.7), LDL 51 TC 107  Fastings 60-80, 130-PP    HTN: Adherent with 5 mg amlodipine, 81 mg ASA, 25 mg losartan; Cr 0.67, 0.79, 0.63.  Has cardiology follow up: 10/13/23  Pt states blood pressures in home 120/70-80    Recurrent UTI, OAB: followed by Uro-GYN,last visit 12/24  Pt taking vibegron--feels helpful for symptoms.    Seronegative inflammatory arthritis: followed by rheumatology, last visit 05/15/23  Infliximab 5 mg-->7.5 q6 weeks at [facility]  Increase PO methotrexate 20 mg--> subcutaneous 25mg  weekly with leucovorin 5mg  weekly    Fibromyalgia: last visit with pain clinic 3/24, pt taking cymbalta , topical NSAD gel, buprenorphine patch prescribed but never dispensed.    HS: followed by dermtaology, last visit 5/24  Retinopathy: followed by [facility], last visit 09/24  I have reviewed past medical, surgical, medications, allergies, social and family histories today and updated them in Epic where appropriate.    ROS:   Review of Systems   Respiratory: Negative.     Cardiovascular: Negative.    All other systems reviewed and are negative.       Review of systems negative unless otherwise noted as per HPI.      Objective:     There were no vitals filed for this visit.  There is no height or weight on file to calculate BMI.    Physical Exam       A & O x 3, no acute distress, speaking with ease  Medication adherence and barriers to the treatment plan have been addressed. Opportunities to optimize healthy behaviors have been discussed. Patient / caregiver voiced understanding.   Cleon Dew, DNP, FNP-C  [facility]  [phone] (F)    Note - This record has been created using [employer]. Chart creation errors have been sought, but may not always have been located. Such creation errors do not reflect on the standard of medical care.

## 2023-05-18 NOTE — Unmapped (Signed)
 Her next appt isn't until 3/25, so it would not be an issue to wait until Monday

## 2023-05-18 NOTE — Unmapped (Signed)
 Messaged palmetto to find out if needs to be signed by attending

## 2023-05-22 NOTE — Unmapped (Signed)
 Updated orders faxed to [facility], smartsheet updated

## 2023-05-29 MED FILL — METHOTREXATE SODIUM (CONTAINS PRESERVATIVES) 25 MG/ML INJECTION SOLUTION: SUBCUTANEOUS | 70 days supply | Qty: 10 | Fill #0

## 2023-05-29 MED FILL — BD TUBERCULIN SYRINGE 1 ML 27 X 1/2": 70 days supply | Qty: 10 | Fill #0

## 2023-05-29 NOTE — Unmapped (Signed)
 Anne Barber 's methotrexate (CONTAINS PRESERVATIVES) 25 mg/mL injection solution shipment will be rescheduled as a result of prior authorization now approved.     I have reached out to the patient  at [phone]  and communicated the delivery change. We will reschedule the medication for the delivery date that the patient agreed upon.  We have confirmed the delivery date as 05/29/23

## 2023-06-02 DIAGNOSIS — E11319 Type 2 diabetes mellitus with unspecified diabetic retinopathy without macular edema: Principal | ICD-10-CM

## 2023-06-02 DIAGNOSIS — Z794 Long term (current) use of insulin: Principal | ICD-10-CM

## 2023-06-02 NOTE — Unmapped (Signed)
 Patients daughter left voicemail reporting patients sugar levels have been running between 70-75 in the morning.  Daughter placed a glucose monitor on patient and it alerted them throughout the night sugar levels are ranging between  45-50.  Patient has decreased insulin glargine to 45 units at night is wondering if she should decrease amount.

## 2023-06-05 DIAGNOSIS — Z794 Long term (current) use of insulin: Principal | ICD-10-CM

## 2023-06-05 DIAGNOSIS — E11319 Type 2 diabetes mellitus with unspecified diabetic retinopathy without macular edema: Principal | ICD-10-CM

## 2023-06-05 MED ORDER — FREESTYLE LIBRE 3 PLUS SENSOR DEVICE
11 refills | 0.00 days | Status: CP
Start: 2023-06-05 — End: ?

## 2023-06-05 MED ORDER — FREESTYLE LIBRE 3 READER
Freq: Once | 0 refills | 0.00 days | Status: CP
Start: 2023-06-05 — End: 2023-06-05

## 2023-06-05 MED FILL — BD ULTRA-FINE NANO PEN NEEDLE 32 GAUGE X 5/32" (4 MM): 90 days supply | Qty: 200 | Fill #0

## 2023-06-05 NOTE — Unmapped (Signed)
 Patient aware.

## 2023-06-05 NOTE — Unmapped (Signed)
 Informed patients daughter recommendations. Anne Barber would like to know if patient can be prescribed a CGM .

## 2023-06-08 NOTE — Unmapped (Signed)
 Called to discuss plan to obtain labs on 3/21 prior to next infliximab infusion. Presence or absence of infliximab antibodies will be helpful in determining next steps for seronegative RA treatment. Per daughter, insurance has been pushing back on prescription of injectable methotrexate and is indicating they may not be willing to pay for it after the current 10 week supply. Will discuss with our pharmacy team.    Candise Bowens MD  PGY4 Rheumatology

## 2023-06-14 DIAGNOSIS — E11319 Type 2 diabetes mellitus with unspecified diabetic retinopathy without macular edema: Principal | ICD-10-CM

## 2023-06-16 ENCOUNTER — Ambulatory Visit: Admit: 2023-06-16 | Discharge: 2023-06-17 | Payer: MEDICARE

## 2023-06-16 DIAGNOSIS — Z79899 Other long term (current) drug therapy: Principal | ICD-10-CM

## 2023-06-16 DIAGNOSIS — M1380 Other specified arthritis, unspecified site: Principal | ICD-10-CM

## 2023-06-16 LAB — COMPREHENSIVE METABOLIC PANEL
ALBUMIN: 3.4 g/dL (ref 3.4–5.0)
ALKALINE PHOSPHATASE: 107 U/L (ref 46–116)
ALT (SGPT): 18 U/L (ref 10–49)
ANION GAP: 10 mmol/L (ref 5–14)
AST (SGOT): 23 U/L (ref <=34)
BILIRUBIN TOTAL: 0.3 mg/dL (ref 0.3–1.2)
BLOOD UREA NITROGEN: 10 mg/dL (ref 9–23)
BUN / CREAT RATIO: 15
CALCIUM: 9.3 mg/dL (ref 8.7–10.4)
CHLORIDE: 103 mmol/L (ref 98–107)
CO2: 27.8 mmol/L (ref 20.0–31.0)
CREATININE: 0.66 mg/dL (ref 0.55–1.02)
EGFR CKD-EPI (2021) FEMALE: >90 mL/min/{1.73_m2} (ref >=60)
GLUCOSE RANDOM: 161 mg/dL (ref 70–179)
POTASSIUM: 4.3 mmol/L (ref 3.4–4.8)
PROTEIN TOTAL: 7.3 g/dL (ref 5.7–8.2)
SODIUM: 141 mmol/L (ref 135–145)

## 2023-06-16 LAB — MAGNESIUM: MAGNESIUM: 2 mg/dL (ref 1.6–2.6)

## 2023-06-16 LAB — CBC W/ AUTO DIFF
BASOPHILS ABSOLUTE COUNT: 0 10*9/L (ref 0.0–0.1)
BASOPHILS RELATIVE PERCENT: 0.5 %
EOSINOPHILS ABSOLUTE COUNT: 0.1 10*9/L (ref 0.0–0.5)
EOSINOPHILS RELATIVE PERCENT: 2.3 %
HEMATOCRIT: 35.5 % (ref 34.0–44.0)
HEMOGLOBIN: 11.5 g/dL (ref 11.3–14.9)
LYMPHOCYTES ABSOLUTE COUNT: 1.4 10*9/L (ref 1.1–3.6)
LYMPHOCYTES RELATIVE PERCENT: 23 %
MEAN CORPUSCULAR HEMOGLOBIN CONC: 32.5 g/dL (ref 32.0–36.0)
MEAN CORPUSCULAR HEMOGLOBIN: 25.6 pg — ABNORMAL LOW (ref 25.9–32.4)
MEAN CORPUSCULAR VOLUME: 78.8 fL (ref 77.6–95.7)
MEAN PLATELET VOLUME: 9.3 fL (ref 6.8–10.7)
MONOCYTES ABSOLUTE COUNT: 0.7 10*9/L (ref 0.3–0.8)
MONOCYTES RELATIVE PERCENT: 12.1 %
NEUTROPHILS ABSOLUTE COUNT: 3.7 10*9/L (ref 1.8–7.8)
NEUTROPHILS RELATIVE PERCENT: 62.1 %
PLATELET COUNT: 271 10*9/L (ref 150–450)
RED BLOOD CELL COUNT: 4.51 10*12/L (ref 3.95–5.13)
RED CELL DISTRIBUTION WIDTH: 17.9 % — ABNORMAL HIGH (ref 12.2–15.2)
WBC ADJUSTED: 6 10*9/L (ref 3.6–11.2)

## 2023-06-16 LAB — C-REACTIVE PROTEIN: C-REACTIVE PROTEIN: <5 mg/L (ref <=10.0)

## 2023-06-23 MED FILL — ASPIRIN 81 MG TABLET,DELAYED RELEASE: ORAL | 90 days supply | Qty: 90 | Fill #1

## 2023-06-23 MED FILL — AMLODIPINE 5 MG TABLET: ORAL | 90 days supply | Qty: 90 | Fill #1

## 2023-06-23 NOTE — Unmapped (Signed)
 Abstraction Result Flowsheet Data    This patient's last AWV date: [facility] Last Medicare Wellness Visit Date: Not Found  This patients last WCC/CPE date: : Not Found      Reason for Encounter  Reason for Encounter: Outreach  Primary Reason for Outreach: AWV  Text Message: No  MyChart Message: No  Outreach Call Outcome: No Voicemail Available

## 2023-06-26 NOTE — Unmapped (Signed)
 Patient requesting a call to discuss lab results, [place] internet for My Chart.

## 2023-06-26 NOTE — Unmapped (Signed)
 Called to discuss her recent lab results. She has no antibodies to infliximab. It sounds like she has had a delayed infusion reaction to the infliximab with cramping and cold sensations in the legs the next day. Her daughter reports that she has been doing better overall with the injectable methotrexate. I will discuss with Dr. Berton Lan whether this may be an atypical infliximab reaction and whether she might do better switching to an alternative treatment.    Candise Bowens MD  PGY4 Rheumatology

## 2023-07-05 NOTE — Unmapped (Signed)
 Called to discuss how things have been going since her infliximab infusion and discuss possibly changing to an alternative agent given the post-infusion/infusion symptoms she is having. The call went to VM, will attempt to call back again in the future.    Melvia Stacks MD  PGY4 Rheumatology

## 2023-07-13 DIAGNOSIS — L732 Hidradenitis suppurativa: Principal | ICD-10-CM

## 2023-07-13 DIAGNOSIS — M47819 Spondylosis without myelopathy or radiculopathy, site unspecified: Principal | ICD-10-CM

## 2023-07-13 DIAGNOSIS — M1380 Other specified arthritis, unspecified site: Principal | ICD-10-CM

## 2023-07-13 MED ORDER — SECUKINUMAB 150 MG/ML SUBCUTANEOUS SYRINGE
SUBCUTANEOUS | 3 refills | 0.00 days | Status: CP
Start: 2023-07-13 — End: 2023-08-11

## 2023-07-13 NOTE — Unmapped (Signed)
-----   Message from Melvia Stacks, MD sent at 07/13/2023 12:18 PM EDT -----  She has been having atypical infusion reactions with the infliximab and would like to transition to injectable cosentyx  for hidradenitis suppurativa/seronegative spondy. She will keep her current outside infusion appointment for now but the plan is to cancel it if/when she is approved for the cosentyx .    Thanks,    Thurston Flow

## 2023-07-13 NOTE — Unmapped (Signed)
 Called to discuss how she was doing. Her joints are improved currently but she would prefer to avoid further infusion reactions with the infliximab- she always has a feeling of total body myalgia, joint pain, coldness the day after her infliximab infusions. We will transition to secukinumab  for her hidradenitis and seronegative spondylarthropathy. No family or personal history of IBD. She will keep her current infusion appointment for now but will cancel it if her secukinumab  is approved and available before the infusion date.     Melvia Stacks MD  PGY4 Rheumatology

## 2023-07-13 NOTE — Unmapped (Signed)
FYI for follow up

## 2023-07-14 DIAGNOSIS — M1380 Other specified arthritis, unspecified site: Principal | ICD-10-CM

## 2023-07-14 DIAGNOSIS — L732 Hidradenitis suppurativa: Principal | ICD-10-CM

## 2023-07-14 DIAGNOSIS — M47819 Spondylosis without myelopathy or radiculopathy, site unspecified: Principal | ICD-10-CM

## 2023-07-14 MED ORDER — COSENTYX 300 MG/2 SYRINGES (150 MG/ML) SUBCUTANEOUS SYRINGE
SUBCUTANEOUS | 0 refills | 0.00 days | Status: CP
Start: 2023-07-14 — End: 2023-08-12
  Filled 2023-08-03: qty 10, 56d supply, fill #0

## 2023-07-14 NOTE — Unmapped (Signed)
 Spoke with Anne Barber via [address] Ernestina Headland, #[id number] regarding upcoming GI procedure on 07/21/23 at [facility] location at 1315 with an arrival time of 1215.     Informed patient prep instructions were sent via mail on 04/03/23.  Confirmed instructions were received, reviewed and understood. No questions at this time. Number provided for future questions if they arise.    Verified prep medications ordered/received.    Reviewed information regarding holding jardiance , metformin , losartan  and short acting insulins day of procedure (until after procedure), taking half dose of long acting insulin  day before procedure and holding day of procedure as well as holding vitamins/supplements the day before and day of procedure.      Reviewed low fiber diet should have started three days prior to procedure.    Reviewed LIQUID diet requirement the entire day prior to procedure as well as NOTHING AT ALL 2 hours prior to procedure    Reviewed driver requirement (18 or older, must be able to drive patient home, NO UBER OR LYFT FOR PICK-UP, MUST SIGN DISCHARGE PAPERS, must remain within 20 minutes of facility and reachable by cell phone).      No further questions at this time.

## 2023-07-17 DIAGNOSIS — M1380 Other specified arthritis, unspecified site: Principal | ICD-10-CM

## 2023-07-17 DIAGNOSIS — L732 Hidradenitis suppurativa: Principal | ICD-10-CM

## 2023-07-17 DIAGNOSIS — M47819 Spondylosis without myelopathy or radiculopathy, site unspecified: Principal | ICD-10-CM

## 2023-07-21 ENCOUNTER — Inpatient Hospital Stay: Admit: 2023-07-21 | Discharge: 2023-07-25 | Payer: MEDICARE

## 2023-07-21 MED ADMIN — lactated Ringers infusion: 10 mL/h | INTRAVENOUS | @ 16:00:00

## 2023-07-21 MED ADMIN — propofol (DIPRIVAN) infusion 10 mg/mL: INTRAVENOUS | @ 18:00:00 | Stop: 2023-07-21

## 2023-07-21 MED ADMIN — lidocaine (PF) (XYLOCAINE-MPF) 20 mg/mL (2 %) injection: INTRAVENOUS | @ 18:00:00 | Stop: 2023-07-21

## 2023-07-25 DIAGNOSIS — E11319 Type 2 diabetes mellitus with unspecified diabetic retinopathy without macular edema: Principal | ICD-10-CM

## 2023-07-25 DIAGNOSIS — Z794 Long term (current) use of insulin: Principal | ICD-10-CM

## 2023-07-25 MED ORDER — TRULICITY 1.5 MG/0.5 ML SUBCUTANEOUS PEN INJECTOR
SUBCUTANEOUS | 3 refills | 28.00000 days
Start: 2023-07-25 — End: 2024-07-23

## 2023-07-26 MED ORDER — TRULICITY 1.5 MG/0.5 ML SUBCUTANEOUS PEN INJECTOR
SUBCUTANEOUS | 3 refills | 28.00000 days | Status: CP
Start: 2023-07-26 — End: 2024-07-24
  Filled 2023-08-01: qty 2, 28d supply, fill #0

## 2023-07-26 NOTE — Unmapped (Signed)
 Patient is requesting the following refill  Requested Prescriptions     Pending Prescriptions Disp Refills    dulaglutide  (TRULICITY ) 1.5 mg/0.5 mL PnIj 2 mL 3     Sig: Inject 0.5 mL (1.5 mg total) under the skin every seven (7) days.       Recent Visits  Date Type Provider Dept   02/02/23 Office Visit Garrett Kallman, FNP [facility] [address] [facility]   09/26/22 Office Visit Albin Huh, Leellen Puller, FNP [facility] [address] [facility]   Showing recent visits within past 365 days and meeting all other requirements  Future Appointments  Date Type Provider Dept   08/31/23 Appointment Garrett Kallman, FNP [facility] [address] [facility]   Showing future appointments within next 365 days and meeting all other requirements       Labs: A1c:   HGB A1C, POC (%)   Date Value   11/16/2016 11.3 (H)     Hemoglobin A1C (%)   Date Value   02/02/2023 6.3 (A)   .
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
## 2023-07-31 NOTE — Unmapped (Signed)
 [facility] Specialty Medication Onboarding    Specialty Medication: Cosentyx   Prior Authorization: Approved   Financial Assistance: No - copay  <$25  Final Copay/Day Supply: $0 / 35 days (LD) & 56 days (MD)    Insurance Restrictions: plan max 56 days    Notes to Pharmacist: pt is enrolled in GLP1  Credit Card on File: not applicable  Start Date on Rx:      The triage team has completed the benefits investigation and has determined that the patient is able to fill this medication at [facility]. Please contact the patient to complete the onboarding or follow up with the prescribing physician as needed.

## 2023-08-01 MED ORDER — EMPTY CONTAINER
2 refills | 0.00000 days
Start: 2023-08-01 — End: ?

## 2023-08-01 MED FILL — OMEPRAZOLE 20 MG CAPSULE,DELAYED RELEASE: ORAL | 90 days supply | Qty: 90 | Fill #2

## 2023-08-01 MED FILL — METFORMIN 1,000 MG TABLET: ORAL | 90 days supply | Qty: 180 | Fill #1

## 2023-08-01 MED FILL — JARDIANCE 25 MG TABLET: ORAL | 90 days supply | Qty: 90 | Fill #1

## 2023-08-01 NOTE — Unmapped (Signed)
 Patient switched to Cosentyx . Infusions at [facility] discontinued. Email sent to [place] at [facility] infusion.

## 2023-08-01 NOTE — Unmapped (Signed)
 [facility] - Clinical Pharmacist Intervention   Reason for Intervention Issue Observed Regimen appropriateness          Additional Comments Daughter had a question about when cosentyx  injections may start after the infusion   Intervention Type of Intervention Education performed                  Recommendation provided, if applicable      Medication Medication(s) Involved Cosentyx    Outcome Expected Result/Outcome of Intervention Improved patient safety    Additional Comments Per Dr. Felipe Horton, Cosentyx  may be started 4 weeks after infusion    Follow-up Needed? No    Additional Follow-up Comments     Supporting Information Approximate Time Spent on Intervention      Clinical Evidence Used Professional judgement     Anne Barber  [facility] Pharmacist

## 2023-08-03 MED FILL — EMPTY CONTAINER: 90 days supply | Qty: 1 | Fill #0

## 2023-08-09 NOTE — Unmapped (Signed)
 I reviewed this patient case and all documentation provided by the learner and was readily available for consultation during their interaction with the patient.  I agree with the assessment and plan listed below.    Sherle Dire, PharmD   [facility] Specialty Pharmacist

## 2023-08-14 MED FILL — BD TUBERCULIN SYRINGE 1 ML 27 X 1/2": 70 days supply | Qty: 10 | Fill #1

## 2023-08-23 DIAGNOSIS — E11319 Type 2 diabetes mellitus with unspecified diabetic retinopathy without macular edema: Principal | ICD-10-CM

## 2023-08-23 DIAGNOSIS — Z794 Long term (current) use of insulin: Principal | ICD-10-CM

## 2023-08-23 MED ORDER — INSULIN GLARGINE-YFGN (U-100) 100 UNIT/ML (3 ML) SUBCUTANEOUS PEN
Freq: Every evening | SUBCUTANEOUS | 11 refills | 90.00000 days | Status: CP
Start: 2023-08-23 — End: 2024-08-22
  Filled 2023-08-28: qty 30, 60d supply, fill #0

## 2023-08-28 MED FILL — TRULICITY 1.5 MG/0.5 ML SUBCUTANEOUS PEN INJECTOR: SUBCUTANEOUS | 28 days supply | Qty: 2 | Fill #1

## 2023-08-31 ENCOUNTER — Ambulatory Visit: Admit: 2023-08-31 | Discharge: 2023-09-01 | Payer: MEDICARE

## 2023-08-31 DIAGNOSIS — F418 Other specified anxiety disorders: Principal | ICD-10-CM

## 2023-08-31 DIAGNOSIS — R051 Acute cough: Principal | ICD-10-CM

## 2023-08-31 DIAGNOSIS — R300 Dysuria: Principal | ICD-10-CM

## 2023-08-31 DIAGNOSIS — N3281 Overactive bladder: Principal | ICD-10-CM

## 2023-08-31 DIAGNOSIS — Z794 Long term (current) use of insulin: Principal | ICD-10-CM

## 2023-08-31 DIAGNOSIS — E11319 Type 2 diabetes mellitus with unspecified diabetic retinopathy without macular edema: Principal | ICD-10-CM

## 2023-08-31 DIAGNOSIS — E7849 Other hyperlipidemia: Principal | ICD-10-CM

## 2023-08-31 DIAGNOSIS — H9190 Unspecified hearing loss, unspecified ear: Principal | ICD-10-CM

## 2023-08-31 DIAGNOSIS — I6523 Occlusion and stenosis of bilateral carotid arteries: Principal | ICD-10-CM

## 2023-08-31 DIAGNOSIS — M1380 Other specified arthritis, unspecified site: Principal | ICD-10-CM

## 2023-08-31 DIAGNOSIS — M797 Fibromyalgia: Principal | ICD-10-CM

## 2023-08-31 DIAGNOSIS — M2550 Pain in unspecified joint: Principal | ICD-10-CM

## 2023-08-31 DIAGNOSIS — I10 Essential (primary) hypertension: Principal | ICD-10-CM

## 2023-08-31 DIAGNOSIS — L732 Hidradenitis suppurativa: Principal | ICD-10-CM

## 2023-08-31 DIAGNOSIS — K219 Gastro-esophageal reflux disease without esophagitis: Principal | ICD-10-CM

## 2023-08-31 LAB — ALBUMIN / CREATININE URINE RATIO
ALBUMIN QUANT URINE: <0.3 mg/dL
ALBUMIN/CREATININE RATIO: <6.9 ug/mg (ref 0.0–30.0)
CREATININE, URINE: 43.3 mg/dL

## 2023-08-31 MED ORDER — AMLODIPINE 5 MG TABLET
ORAL_TABLET | Freq: Every day | ORAL | 3 refills | 90.00000 days | Status: CP
Start: 2023-08-31 — End: 2024-08-30
  Filled 2023-09-06: qty 90, 90d supply, fill #0

## 2023-08-31 MED ORDER — BENZONATATE 100 MG CAPSULE
ORAL_CAPSULE | Freq: Every evening | ORAL | 0 refills | 20.00000 days | Status: CP | PRN
Start: 2023-08-31 — End: ?
  Filled 2023-09-06: qty 20, 20d supply, fill #0

## 2023-08-31 MED ORDER — VIBEGRON 75 MG TABLET
ORAL_TABLET | Freq: Every day | ORAL | 11 refills | 30.00000 days | Status: CP
Start: 2023-08-31 — End: ?
  Filled 2023-09-07: qty 30, 30d supply, fill #0

## 2023-08-31 MED ORDER — DEXCOM G7 SENSOR DEVICE
11 refills | 0.00000 days | Status: CP
Start: 2023-08-31 — End: ?

## 2023-08-31 MED ORDER — FLUTICASONE PROPIONATE 50 MCG/ACTUATION NASAL SPRAY,SUSPENSION
Freq: Two times a day (BID) | NASAL | 3 refills | 180.00000 days | Status: CP
Start: 2023-08-31 — End: 2024-08-30
  Filled 2023-09-06: qty 16, 60d supply, fill #0

## 2023-08-31 MED ORDER — DEXCOM G7 RECEIVER
Freq: Once | 0 refills | 0.00000 days | Status: CP
Start: 2023-08-31 — End: 2023-08-31

## 2023-08-31 NOTE — Unmapped (Addendum)
 [facility]: Return in about 3 months (around 03/13/2023) for V/T/DFE/OCT RNFL/HVF 10-2.   [address], [place], [place] [address]  Phone: [phone]      Hearing:  Lisa Rideau, AuD  Clinical Audiologist  [facility]  Scheduling: [phone]

## 2023-08-31 NOTE — Unmapped (Signed)
 Assessment and Plan:     Anne Barber was seen today for cough, headache, possible uti and bleeding/bruising.    Diagnoses and all orders for this visit:    Type 2 diabetes mellitus with retinopathy, with long-term current use of insulin , macular edema presence unspecified, unspecified laterality, unspecified retinopathy severity            -     Pt states highest blood sugar 150s, no hypoglycemia        -     A1c-7.4 (today) 6.3--7.5--6.7--6.8 Cr 0.66, K 4.3        -     Continue:1000 mg metformin  BID, 1.5 mg Trulicity , 25 mg jardiance , 45 U long acting insulin         -     Overdue with visit at [facility] # provided. Will try to get CGM for patient.  -     POCT glycosylated hemoglobin (Hb A1C)  -     Microalbumin / creatinine urine ratio  -     insulin  glargine-yfgn (SEMGLEE ,INSULIN  GLARG-YFGN,PEN) 100 unit/mL (3 mL) InPn; Inject 45 Units under the skin nightly.      Bilateral carotid artery stenosis  Other hyperlipidemia  Primary hypertension          -     Adherent with 5 mg amlodipine , 25 mg losartan , 81 mg ASA, 40 mg atorvastatin , upcoming cardiology appointment: 10/20/23. NM scan and echo in 2024--normal        -     A1c 6.3--7.5--6.7--6.8 Cr 0.66, K 4.3       -      Leg swelling: Compression stockings, walking, decrease salt  -     amlodipine  (NORVASC ) 5 MG tablet; Take 1 tablet (5 mg total) by mouth daily.    Hidradenitis suppurativa         -  Managed by dermatology--last visit 5/24    Seronegative inflammatory arthritis  Polyarthralgia  Fibromyalgia    -Managed by Rheumatology, upcoming visit 09/08/23  -Taking methotrexate ,  leucovorin , Cosentyx     Depression with anxiety    -Mental health stable on duloxetine , trazodone     OAB (overactive bladder)          -     Last seen by Uro-GYN 12/24, for unknown reasons did not start vibegron , willing to try, rx re-sent  -     vibegron  75 mg Tab; Take 1 tablet (75 mg total) by mouth daily.    Gastroesophageal reflux disease, unspecified whether esophagitis present          - Taking PPI, helpful    Burning with urination          -     Likely due to OAB, poor glycemic control  -     POCT urinalysis dipstick: +Glucose, trace WBC  -     Urine Culture: pending    Acute cough          -     Lungs --clear, no evidence of secondary bacterial infection warranting abx        -     Refills provided today  -     benzonatate  (TESSALON ) 100 MG capsule; Take 1 capsule (100 mg total) by mouth nightly as needed for cough.  -     fluticasone  propionate (FLONASE ) 50 mcg/actuation nasal spray; Use 1 spray into each nostril two (2) times a day.    Hearing loss, unspecified hearing loss type, unspecified laterality    -Both pt and daughter  endorse subjective hearing loss  -Last visit to audiology in 2023--given phone # to call and schedule appointment    Headache: home care advice/reassurance, pt plans to try OTC analgesia--will contact provider if desires referral    Here with daughter    Barriers to recommended plan: financial    Return for 3 -4 months .      Subjective:     HPI: Anne Barber is a 63 y.o. female here for Cough (States cough was resolved during infusions, cough has been present for about 3 weeks. Had first Cosentyx  injection on Tuesday, cough went away yesterday, and came back this morning. Also has some nasal congestion), Headache (C/o left sided head pain, constant pain. Pain level 6/10), Possible UTI (C/o burning when urinating/), and Bleeding/Bruising (C/o bruising on bilateral lower extremities and cramping at night. States she does not know if it is related to the Fibromyalgia or not).    Follow up on chronic conditions:    Type 2 DM: due to concerns of hypoglycemia, glargine decreased from 50 U to 45 U  Pt remains adherent with: 1000 mg metformin  BID, 1.5 mg Trulicity , 25 mg jardiance .   Pt states highest blood sugar has been 150s, no hypoglycemia  Gets regular eye care at [facility] not been seen in some time, needs follow up  A1c 6.3--7.5--6.7--6.8 Cr 0.66, K 4.3  Daughter would appreciate investigation into Dexcom Sensor    Cough: x 3 weeks, has both days/nights, getting better. No shortness of breath, no chest pains; With frequent, intense coughing spells--hard time catching breath  Thinks allergies contributing, would like refill on nasal spray, cough suppressant    HTN, high cholesterol, B carotid artery stenosis: Pt adherent with 81 mg ASA, 40 mg atorvastatin , 25 mg losartan . LDL 51, TC 107, upcoming cardiology visit: 10/20/23    Left sided headed pains: feels like something moving in left scalp, has noticed x 3-4 months. Feels like burning inside on scalp. No change in vision, no pins/needles/weakness in B upper/lower extremities    Fibromyalgia/RA: managed by rheumatology--upcoming visit 09/08/23. Managed on combination of methotrexate , leucovorin , Cosentyx     Urinary frequency/dysuria: Was seen for problem by uro-gyn 12/24, started on vibegron --never started, but open to getting filled    Swelling on veins in BLE, B swelling in feet: symptoms intermittent, developed bruises in posterior BLE. Bruises now improving--having intermittent swelling/cramps in BLE    Insomnia/chronic pain: taking trazodone , duloxetine     Hearing: last check on hearing in 2023---open to visit back to audiology    HS: managed by dermatology, taking clindamycin , last visit 5/24  I have reviewed past medical, surgical, medications, allergies, social and family histories today and updated them in Epic where appropriate.    ROS:   Review of Systems   Constitutional: Negative.    Respiratory:  Positive for cough. Negative for shortness of breath.    Cardiovascular:  Positive for leg swelling.   Genitourinary:  Positive for difficulty urinating, dysuria and urgency.   Musculoskeletal:  Positive for arthralgias.   Skin: Negative.    Neurological:  Positive for headaches.   Psychiatric/Behavioral: Negative.     All other systems reviewed and are negative.     PHQ-9 PHQ-9 Total Score   08/31/2023 2:30 PM 0   02/01/2022  10:00 AM 2   01/29/2021   1:00 PM 9   01/20/2020   5:00 PM 7   11/05/2019   1:00 PM 11      GAD7 Total Score GAD-7 Total  Score   02/01/2022  10:00 AM 0   01/29/2021   1:00 PM 3   01/20/2020   6:00 PM 7      Wt Readings from Last 6 Encounters:   08/31/23 85.7 kg (189 lb)   07/21/23 84.8 kg (187 lb)   05/15/23 88.5 kg (195 lb)   03/17/23 86.4 kg (190 lb 6.4 oz)   02/02/23 85.8 kg (189 lb 1.6 oz)   12/05/22 85.5 kg (188 lb 6.4 oz)      Review of systems negative unless otherwise noted as per HPI.      Objective:     Vitals:    08/31/23 1432   BP: 112/60   Pulse: 90   SpO2: 98%     Body mass index is 36.91 kg/m??.    Physical Exam  Vitals and nursing note reviewed.   Constitutional:       Appearance: Normal appearance.   HENT:      Head: Normocephalic and atraumatic.      Nose: Nose normal.   Cardiovascular:      Rate and Rhythm: Normal rate and regular rhythm.      Pulses: Normal pulses.      Heart sounds: Normal heart sounds.      Comments: HR 88  Pulmonary:      Effort: Pulmonary effort is normal.      Breath sounds: Normal breath sounds.   Musculoskeletal:         General: Normal range of motion.      Cervical back: Normal range of motion and neck supple.   Skin:     General: Skin is warm.      Capillary Refill: Capillary refill takes less than 2 seconds.   Neurological:      General: No focal deficit present.      Mental Status: She is alert and oriented to person, place, and time. Mental status is at baseline.   Psychiatric:         Mood and Affect: Mood normal.         Behavior: Behavior normal.         Thought Content: Thought content normal.         Judgment: Judgment normal.            Medication adherence and barriers to the treatment plan have been addressed. Opportunities to optimize healthy behaviors have been discussed. Patient / caregiver voiced understanding.   I personally spent 45 minutes face-to-face and non-face-to-face in the care of this patient, which includes all pre, intra, and post visit time on the date of service.  Suszanne Eriksson, DNP, FNP-C  [facility]  [phone] (F)    Note - This record has been created using [employer]. Chart creation errors have been sought, but may not always have been located. Such creation errors do not reflect on the standard of medical care.

## 2023-09-02 MED ORDER — CEPHALEXIN 500 MG CAPSULE
ORAL_CAPSULE | Freq: Two times a day (BID) | ORAL | 0 refills | 7.00000 days | Status: CP
Start: 2023-09-02 — End: 2023-09-09

## 2023-09-06 DIAGNOSIS — N3281 Overactive bladder: Principal | ICD-10-CM
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
## 2023-09-08 ENCOUNTER — Ambulatory Visit
Admit: 2023-09-08 | Discharge: 2023-09-08 | Payer: MEDICARE | Attending: Student in an Organized Health Care Education/Training Program | Primary: Student in an Organized Health Care Education/Training Program

## 2023-09-08 ENCOUNTER — Inpatient Hospital Stay: Admit: 2023-09-08 | Discharge: 2023-09-08 | Payer: MEDICARE

## 2023-09-08 ENCOUNTER — Ambulatory Visit: Admit: 2023-09-08 | Discharge: 2023-09-08 | Payer: MEDICARE

## 2023-09-08 ENCOUNTER — Encounter: Admit: 2023-09-08 | Discharge: 2023-09-08 | Payer: MEDICARE | Attending: Rheumatology | Primary: Rheumatology

## 2023-09-08 DIAGNOSIS — M1380 Other specified arthritis, unspecified site: Principal | ICD-10-CM

## 2023-09-08 DIAGNOSIS — D649 Anemia, unspecified: Principal | ICD-10-CM

## 2023-09-08 DIAGNOSIS — Z79899 Other long term (current) drug therapy: Principal | ICD-10-CM

## 2023-09-08 LAB — CBC W/ AUTO DIFF
BASOPHILS ABSOLUTE COUNT: 0 10*9/L (ref 0.0–0.1)
BASOPHILS RELATIVE PERCENT: 0.9 %
EOSINOPHILS ABSOLUTE COUNT: 0.1 10*9/L (ref 0.0–0.5)
EOSINOPHILS RELATIVE PERCENT: 1.5 %
HEMATOCRIT: 33.3 % — ABNORMAL LOW (ref 34.0–44.0)
HEMOGLOBIN: 10.7 g/dL — ABNORMAL LOW (ref 11.3–14.9)
LYMPHOCYTES ABSOLUTE COUNT: 1.4 10*9/L (ref 1.1–3.6)
LYMPHOCYTES RELATIVE PERCENT: 26.5 %
MEAN CORPUSCULAR HEMOGLOBIN CONC: 32.1 g/dL (ref 32.0–36.0)
MEAN CORPUSCULAR HEMOGLOBIN: 24.5 pg — ABNORMAL LOW (ref 25.9–32.4)
MEAN CORPUSCULAR VOLUME: 76.5 fL — ABNORMAL LOW (ref 77.6–95.7)
MEAN PLATELET VOLUME: 9 fL (ref 6.8–10.7)
MONOCYTES ABSOLUTE COUNT: 0.5 10*9/L (ref 0.3–0.8)
MONOCYTES RELATIVE PERCENT: 9.3 %
NEUTROPHILS ABSOLUTE COUNT: 3.3 10*9/L (ref 1.8–7.8)
NEUTROPHILS RELATIVE PERCENT: 61.8 %
PLATELET COUNT: 287 10*9/L (ref 150–450)
RED BLOOD CELL COUNT: 4.35 10*12/L (ref 3.95–5.13)
RED CELL DISTRIBUTION WIDTH: 21.1 % — ABNORMAL HIGH (ref 12.2–15.2)
WBC ADJUSTED: 5.4 10*9/L (ref 3.6–11.2)

## 2023-09-08 LAB — CREATININE
CREATININE: 0.73 mg/dL (ref 0.55–1.02)
EGFR CKD-EPI (2021) FEMALE: >90 mL/min/{1.73_m2} (ref >=60)

## 2023-09-08 LAB — ALBUMIN: ALBUMIN: 3.5 g/dL (ref 3.4–5.0)

## 2023-09-08 LAB — ALT: ALT (SGPT): 18 U/L (ref 10–49)

## 2023-09-08 LAB — FERRITIN: FERRITIN: 18.5 ng/mL (ref 7.3–270.7)

## 2023-09-08 LAB — BUN: BLOOD UREA NITROGEN: 14 mg/dL (ref 9–23)

## 2023-09-08 LAB — AST: AST (SGOT): 24 U/L (ref <=34)

## 2023-09-08 LAB — SLIDE REVIEW

## 2023-09-08 MED ORDER — METHOTREXATE SODIUM (CONTAINS PRESERVATIVES) 25 MG/ML INJECTION SOLUTION
SUBCUTANEOUS | 2 refills | 70.00000 days | Status: CP
Start: 2023-09-08 — End: ?

## 2023-09-08 MED ORDER — LEUCOVORIN CALCIUM 25 MG TABLET
ORAL_TABLET | ORAL | 3 refills | 91.00000 days | Status: CP
Start: 2023-09-08 — End: 2024-09-07
  Filled 2023-09-13: qty 12, 84d supply, fill #0

## 2023-09-08 NOTE — Unmapped (Addendum)
 We'll increase the leucovorin  to 25mg  to be taken the morning after methotrexate  administration. Use the methotrexate  just before bed so that any fatigue will happen while you are sleeping.    We will get a chest X-ray today to see if there are any cause for the cough- would recommend continuing to follow with your primary care about it    I will let you know about your lab results from today through MyChart

## 2023-09-11 LAB — IRON & TIBC
IRON SATURATION: 16 % — ABNORMAL LOW (ref 20–55)
IRON: 64 ug/dL (ref 50–170)
TOTAL IRON BINDING CAPACITY: 396 ug/dL (ref 250–425)

## 2023-09-11 LAB — CYCLIC CITRUL PEPTIDE ANTIBODY, IGG
CCP ANTIBODIES: <1 {ELISA'U} (ref <7.0)
CCP IGG ANTIBODIES: NEGATIVE

## 2023-09-11 NOTE — Unmapped (Addendum)
 [facility] RHEUMATOLOGY CLINIC - PHARMACIST NOTES    Received notification from Dr. Claudene to reach patient and review injection techniques for Cosentyx .  No answer, LMV requesting call back. Mychart message sent.    Addendum:   Patient's daughter returned phone call. Reviewed Cosenyx injection techniques and sent email with link to videos where patient can watch injection closely.  Advised patient to rotate injection sites, apply ice to injection site a few minutes prior to injection.  If this does not help with injection site pain, can apply lidocaine  gel to injection site.      Anne Barber

## 2023-09-21 MED FILL — TRULICITY 1.5 MG/0.5 ML SUBCUTANEOUS PEN INJECTOR: SUBCUTANEOUS | 28 days supply | Qty: 2 | Fill #2

## 2023-09-21 MED FILL — ASPIRIN 81 MG TABLET,DELAYED RELEASE: ORAL | 90 days supply | Qty: 90 | Fill #2

## 2023-10-05 NOTE — Unmapped (Signed)
 Patient's daughter called to make sure of medication instructions now that Cosentyx  loading dose is done. We went over the instructions per Dr. Theressa last note:    Cosentyx  every 28 days now  Methotrexate  weekly at bedtime to help with side effects  Leucovorin  weekly day after methotrexate     Daughter verbalized U/C

## 2023-10-09 NOTE — Unmapped (Signed)
 [facility] Clinical Assessment & Refill Coordination Note    Anne Barber, DOB: 30-May-1960  Phone: [phone] (home)     All above HIPAA information was verified with patient's family member, Kirke (daughter).     Was a Nurse, learning disability used for this call? No    Specialty Medication(s):   Inflammatory Disorders: Cosentyx      Current Medications[1]     Changes to medications: Ileanna reports no changes at this time.    Medication list has been reviewed and updated in Epic: Yes    Allergies[2]    Changes to allergies: No    Allergies have been reviewed and updated in Epic: Yes    SPECIALTY MEDICATION ADHERENCE     Cosentyx  150mg /mL : 0 doses of medicine on hand     Medication Adherence    Patient reported X missed doses in the last month: 0  Specialty Medication: Cosentyx  150mg /mL  Informant: child/children  Confirmed plan for next specialty medication refill: delivery by pharmacy  Refills needed for supportive medications: not needed          Specialty medication(s) dose(s) confirmed: Regimen is correct and unchanged.     Are there any concerns with adherence? No    Adherence counseling provided? Not needed    CLINICAL MANAGEMENT AND INTERVENTION      Clinical Benefit Assessment:    Do you feel the medicine is effective or helping your condition? Yes, but at around day 25-26 she has some minor flaring that resolves with topical cream.    Clinical Benefit counseling provided? Not needed    Adverse Effects Assessment:    Are you experiencing any side effects? No    Are you experiencing difficulty administering your medicine? No    Quality of Life Assessment:    Quality of Life    Rheumatology  1. What impact has your specialty medication had on the reduction of your daily pain level?: Some  2. What impact has your specialty medication had on your ability to complete daily tasks (prepare meals, get dressed, etc...)?: Some  Oncology  Dermatology  Cystic Fibrosis          How many days over the past month did your condition  keep you from your normal activities? For example, brushing your teeth or getting up in the morning. 0    Have you discussed this with your provider? Not needed    Acute Infection Status:    Acute infections noted within Epic:  No active infections    Patient reported infection: None    Therapy Appropriateness:    Is therapy appropriate based on current medication list, adverse reactions, adherence, clinical benefit and progress toward achieving therapeutic goals? Yes, therapy is appropriate and should be continued     Clinical Intervention:    Was an intervention completed as part of this clinical assessment? No    DISEASE/MEDICATION-SPECIFIC INFORMATION      For patients on injectable medications: Patient currently has 0 doses left.  Next injection is scheduled for 10/24/2023.    Chronic Inflammatory Diseases: Have you experienced any flares in the last month? Yes, around day 25-26 she has some minor flaring that resolves with topical cream.  Has this been reported to your provider? Yes, per the daughter.    PATIENT SPECIFIC NEEDS     Does the patient have any physical, cognitive, or cultural barriers? No    Is the patient high risk? No    Does the patient require  physician intervention or other additional services (i.e., nutrition, smoking cessation, social work)? No    Does the patient have an additional or emergency contact listed in their chart? Yes    SOCIAL DETERMINANTS OF HEALTH     At the [facility], we have learned that life circumstances - like trouble affording food, housing, utilities, or transportation can affect the health of many of our patients.   That is why we wanted to ask: are you currently experiencing any life circumstances that are negatively impacting your health and/or quality of life? No    Social Drivers of Health     Food Insecurity: No Food Insecurity (08/31/2023)    Hunger Vital Sign     Worried About Running Out of Food in the Last Year: Never true Ran Out of Food in the Last Year: Never true   Tobacco Use: Low Risk  (09/08/2023)    Patient History     Smoking Tobacco Use: Never     Smokeless Tobacco Use: Never     Passive Exposure: Not on file   Transportation Needs: No Transportation Needs (08/31/2023)    PRAPARE - Transportation     Lack of Transportation (Medical): No     Lack of Transportation (Non-Medical): No   Alcohol  Use: Not At Risk (07/08/2022)    Alcohol  Use     How often do you have a drink containing alcohol ?: Never     How many drinks containing alcohol  do you have on a typical day when you are drinking?: 1 - 2     How often do you have 5 or more drinks on one occasion?: Never   Housing: Low Risk  (08/31/2023)    Housing     Within the past 12 months, have you ever stayed: outside, in a car, in a tent, in an overnight shelter, or temporarily in someone else's home (i.e. couch-surfing)?: No     Are you worried about losing your housing?: No   Physical Activity: Not on file   Utilities: Low Risk  (08/31/2023)    Utilities     Within the past 12 months, have you been unable to get utilities (heat, electricity) when it was really needed?: No   Stress: Not on file   Interpersonal Safety: Not At Risk (08/31/2023)    Interpersonal Safety     Unsafe Where You Currently Live: No     Physically Hurt by Anyone: No     Abused by Anyone: No   Substance Use: Not on file (07/09/2023)   Intimate Partner Violence: Not on file   Social Connections: Not on file   Financial Resource Strain: Low Risk  (05/31/2021)    Overall Financial Resource Strain (CARDIA)     Difficulty of Paying Living Expenses: Not hard at all   Health Literacy: Low Risk  (11/20/2020)    Health Literacy     : Never   Internet Connectivity: Not on file       Would you be willing to receive help with any of the needs that you have identified today? No       SHIPPING     Specialty Medication(s) to be Shipped:   Inflammatory Disorders: Cosentyx     Other medication(s) to be shipped: No additional medications requested for fill at this time     Changes to insurance: No    Cost and Payment: Patient has a $0 copay, payment information is not required.    Delivery Scheduled: Yes, Expected medication  delivery date: 10/11/2023.     Medication will be delivered via Same Day Courier to the confirmed prescription address in [facility].    The patient will receive a drug information handout for each medication shipped and additional FDA Medication Guides as required.  Verified that patient has previously received a Conservation officer, historic buildings and a Surveyor, mining.    The patient or caregiver noted above participated in the development of this care plan and knows that they can request review of or adjustments to the care plan at any time.      All of the patient's questions and concerns have been addressed.    Velma Ober, PharmD   [facility] Specialty Pharmacist         [1]   Current Outpatient Medications   Medication Sig Dispense Refill    amlodipine  (NORVASC ) 5 MG tablet Take 1 tablet (5 mg total) by mouth daily. 90 tablet 3    aspirin  (ECOTRIN) 81 MG tablet Take 1 tablet (81 mg total) by mouth daily. 90 tablet 3    atorvastatin  (LIPITOR) 40 MG tablet Take 1 tablet (40 mg total) by mouth daily. 90 tablet 3    BD INSULIN  SYRINGE ULT-FINE II 1 mL 31 gauge x 5/16 Syrg 1 Package by Miscellaneous route Two (2) times a day. 100 each 3    benzonatate  (TESSALON ) 100 MG capsule Take 1 capsule (100 mg total) by mouth nightly as needed for cough. 20 capsule 0    benzonatate  (TESSALON ) 100 MG capsule Take 1 capsule (100 mg total) by mouth in the morning.      blood sugar diagnostic (ONETOUCH ULTRA BLUE TEST STRIP) Strp USE TO CHECK BLOOD SUGAR UP TO 10 TIMES DAILY AS NEEDED 600 strip 1    blood-glucose meter kit Use as instructed 1 each 0    blood-glucose meter,continuous (FREESTYLE LIBRE 3 READER) Misc 1 Device by Miscellaneous route once for 1 dose. 1 each 0    blood-glucose sensor (DEXCOM G7 SENSOR) Devi 1 Device by Miscellaneous route every ten (10) days. 3 each 11    blood-glucose sensor (FREESTYLE LIBRE 3 PLUS SENSOR) Devi 1 Device by Miscellaneous route every fourteen (14) days. 3 each 11    blood-glucose,receiver,cont (DEXCOM G7 RECEIVER) Misc 1 Device by Miscellaneous route once for 1 dose. 1 each 0    buprenorphine  5 mcg/hour PTWK transdermal patch Place 1 patch on the skin every seven (7) days. 4 patch 1    cephalexin  (KEFLEX ) 500 MG capsule Take 2 capsules (1,000 mg total) by mouth in the morning.      clindamycin  (CLEOCIN  T) 1 % lotion Apply topically every morning. 60 mL 5    clindamycin  (CLEOCIN  T) 1 % Swab Apply 1 application topically Two (2) times a day. 60 each 11    dexamethasone  (DECADRON ) 0.1 % ophthalmic solution Administer 4 drops into the left ear Two (2) times a day for 30 days 5 mL 0    dextran 70-hypromellose, PF, (TEARS NATURAL FREE) 0.1-0.3 % Dpet       dorzolamide -timolol  (COSOPT ) 22.3-6.8 mg/mL ophthalmic solution Administer 1 drop to both eyes two (2) times a day. 10 mL 12    dulaglutide  (TRULICITY ) 1.5 mg/0.5 mL PnIj Inject 0.5 mL (1.5 mg total) under the skin every seven (7) days. 2 mL 3    DULoxetine  (CYMBALTA ) 60 MG capsule Take 1 capsule (60 mg total) by mouth daily. 90 capsule 3    empagliflozin  (JARDIANCE )  25 mg tablet Take 1 tablet (25 mg total) by mouth daily. 90 tablet 3    empty container Misc Use as directed to dispose of Humira  pens. 1 each 2    empty container Misc USE AS DIRECTED 1 each 2    flash glucose sensor (FREESTYLE LIBRE 2 SENSOR) kit Use to monitor blood glucose levels continuously. Change sensor every 10 days 3 each 11    fluticasone  propionate (FLONASE ) 50 mcg/actuation nasal spray Use 1 spray into each nostril two (2) times a day. 48 g 3    folic acid /multivit-min/lutein (CENTRUM SILVER ORAL) Take 1 tablet by mouth daily at 0600.       insulin  glargine-yfgn (SEMGLEE ,INSULIN  GLARG-YFGN,PEN) 100 unit/mL (3 mL) InPn Inject 50 Units under the skin nightly. 45 mL 11 insulin  glargine-yfgn (SEMGLEE ,INSULIN  GLARG-YFGN,PEN) 100 unit/mL (3 mL) InPn Inject 45 Units under the skin nightly.      lancing device Misc USE 4 TIMES DAILY BEFORE MEALS AND NIGHTLY ( USAR CUATRO VECES AL DIA SPBM Y TODAS LAS NOCHES ) 1 each 1    leuCOVorin  (WELLCOVORIN ) 25 mg tablet Take 1 tablet (25 mg total) by mouth once a week. To be taken 12 hours AFTER methotrexate  dose 13 tablet 3    losartan  (COZAAR ) 25 MG tablet Take 1 tablet (25 mg total) by mouth daily. 90 tablet 3    metFORMIN  (GLUCOPHAGE ) 1000 MG tablet Take 1 tablet (1,000 mg total) by mouth 2 (two) times a day with meals. 180 tablet 3    methotrexate  sodium (METHOTREXATE , CONTAINS PRESERVATIVES,) 25 mg/mL injection solution Inject 1 mL (25 mg total) under the skin once a week. 10 mL 2    omeprazole  (PRILOSEC) 20 MG capsule Take 1 capsule (20 mg total) by mouth daily. 90 capsule 3    pen needle, diabetic 32 gauge x 5/32 (4 mm) Ndle 1 each by Miscellaneous route Two (2) times a day (30 minutes before a meal). 200 each 1    polyethylene glycol (GLYCOLAX ) 17 gram/dose powder Take 17 g by mouth daily. 238 g 11    secukinumab  (COSENTYX , 2 SYRINGES,) 150 mg/mL Syrg Inject the contents of 2 syringes (300 mg) under the skin every twenty-eight (28) days. To start after loading doses 6 mL 3    syringe 1 mL 27 x 1/2 Syrg use for MTX injections once a week. 25 each 2    traZODone  (DESYREL ) 50 MG tablet Take 1 tablet (50 mg total) by mouth nightly. 90 tablet 3    vibegron  75 mg Tab Take 1 tablet (75 mg total) by mouth daily. 30 tablet 11     No current facility-administered medications for this visit.     Facility-Administered Medications Ordered in Other Visits   Medication Dose Route Frequency Provider Last Rate Last Admin    matrix hemostatic sealant (FLOSEAL) topical             matrix hemostatic sealant (FLOSEAL) topical            [2]   Allergies  Allergen Reactions    Other      steroids

## 2023-10-11 MED FILL — COSENTYX 300 MG/2 SYRINGES (150 MG/ML) SUBCUTANEOUS SYRINGE: SUBCUTANEOUS | 56 days supply | Qty: 4 | Fill #0
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
## 2023-10-20 DIAGNOSIS — R9431 Abnormal electrocardiogram [ECG] [EKG]: Principal | ICD-10-CM

## 2023-10-20 DIAGNOSIS — I6523 Occlusion and stenosis of bilateral carotid arteries: Principal | ICD-10-CM

## 2023-10-20 DIAGNOSIS — I2510 Atherosclerotic heart disease of native coronary artery without angina pectoris: Principal | ICD-10-CM

## 2023-10-20 DIAGNOSIS — E7849 Other hyperlipidemia: Principal | ICD-10-CM

## 2023-10-20 DIAGNOSIS — I351 Nonrheumatic aortic (valve) insufficiency: Principal | ICD-10-CM

## 2023-10-20 DIAGNOSIS — I10 Essential (primary) hypertension: Principal | ICD-10-CM

## 2023-10-20 DIAGNOSIS — Z0000 Encounter for general adult medical examination without abnormal findings: Principal | ICD-10-CM

## 2023-10-20 MED FILL — TRULICITY 1.5 MG/0.5 ML SUBCUTANEOUS PEN INJECTOR: SUBCUTANEOUS | 28 days supply | Qty: 2 | Fill #3

## 2023-10-20 NOTE — Unmapped (Signed)
 Spanish Interpreter: [name]  ID: [id number]    Marchelle Rinella J Oyinkansola Truax, RN

## 2023-10-20 NOTE — Unmapped (Signed)
 Thank you for coming to see [place] today at [facility] at [facility]. Please call us at [phone] if you have any questions.

## 2023-10-23 MED ORDER — DORZOLAMIDE 22.3 MG-TIMOLOL 6.8 MG/ML EYE DROPS
Freq: Two times a day (BID) | OPHTHALMIC | 12 refills | 100.00000 days | Status: CP
Start: 2023-10-23 — End: ?
  Filled 2024-01-01: qty 10, 50d supply, fill #0
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
## 2023-10-24 NOTE — Unmapped (Signed)
I have made myself available to the resident to discuss the patient's findings, assessment, and plan.       Anne Barber Ethie Curless, MD  [place] Department of Ophthalmology

## 2023-10-30 MED FILL — GEMTESA 75 MG TABLET: ORAL | 30 days supply | Qty: 30 | Fill #1

## 2023-10-30 MED FILL — METFORMIN 1,000 MG TABLET: ORAL | 90 days supply | Qty: 180 | Fill #2

## 2023-10-30 MED FILL — OMEPRAZOLE 20 MG CAPSULE,DELAYED RELEASE: ORAL | 90 days supply | Qty: 90 | Fill #3

## 2023-10-30 MED FILL — INSULIN GLARGINE-YFGN (U-100) 100 UNIT/ML (3 ML) SUBCUTANEOUS PEN: SUBCUTANEOUS | 60 days supply | Qty: 30 | Fill #1

## 2023-10-30 MED FILL — JARDIANCE 25 MG TABLET: ORAL | 90 days supply | Qty: 90 | Fill #2

## 2023-10-30 MED FILL — ATORVASTATIN 40 MG TABLET: ORAL | 90 days supply | Qty: 90 | Fill #1

## 2023-10-30 MED FILL — LOSARTAN 25 MG TABLET: ORAL | 90 days supply | Qty: 90 | Fill #1

## 2023-11-01 NOTE — Unmapped (Signed)
 Open in error. Last filled 7.16.25 for 56 days supply

## 2023-11-15 DIAGNOSIS — E11319 Type 2 diabetes mellitus with unspecified diabetic retinopathy without macular edema: Principal | ICD-10-CM

## 2023-11-15 DIAGNOSIS — Z794 Long term (current) use of insulin: Principal | ICD-10-CM

## 2023-11-15 MED ORDER — TRULICITY 1.5 MG/0.5 ML SUBCUTANEOUS PEN INJECTOR
SUBCUTANEOUS | 6 refills | 28.00000 days | Status: CP
Start: 2023-11-15 — End: 2024-11-13
  Filled 2023-11-17: qty 2, 28d supply, fill #0

## 2023-11-15 NOTE — Unmapped (Signed)
 Patient is requesting the following refill  Requested Prescriptions     Pending Prescriptions Disp Refills    dulaglutide  (TRULICITY ) 1.5 mg/0.5 mL PnIj 2 mL 3     Sig: Inject 0.5 mL (1.5 mg total) under the skin every seven (7) days.       Recent Visits  Date Type Provider Dept   08/31/23 Office Visit Lora [name] HERO, FNP [facility] [address] [facility]   02/02/23 Office Visit Lora, [name] HERO, FNP [facility] [address] [facility]   Showing recent visits within past 365 days and meeting all other requirements  Future Appointments  Date Type Provider Dept   03/14/24 Appointment Lora [name] HERO, FNP [facility] [address] [facility]   Showing future appointments within next 365 days and meeting all other requirements       Labs: A1c:   HGB A1C, POC (%)   Date Value   11/16/2016 11.3 (H)     Hemoglobin A1C (%)   Date Value   08/31/2023 7.4 (A)

## 2023-11-29 NOTE — Unmapped (Signed)
 [facility] Refill Coordination Note    Specialty Medication(s) to be Shipped:   Inflammatory Disorders: Cosentyx     Other medication(s) to be shipped: No additional medications requested for fill at this time    Specialty Medications not needed at this time: N/A     Anne Barber, DOB: 1960/07/21  Phone: [phone] (home)       All above HIPAA information was verified with patient's family member, daughter Anne Barber.     Was a Nurse, learning disability used for this call? Yes, Interpreter ID [id number]. Patient language is appropriate in [facility]    Completed refill call assessment today to schedule patient's medication shipment from the [facility]  [phone]).  All relevant notes have been reviewed.     Specialty medication(s) and dose(s) confirmed: Regimen is correct and unchanged.   Changes to medications: Anne Barber reports no changes at this time.  Changes to insurance: Yes: now [employer] - [employer]  New side effects reported not previously addressed with a pharmacist or physician: Yes - Patient reports unsure if it's side effect but pt is complaining more of cramps on her feet . Patient would like to speak to the pharmacist today. Their provider is not aware.  Questions for the pharmacist: No    Confirmed patient received a Conservation officer, historic buildings and a Surveyor, mining with first shipment. The patient will receive a drug information handout for each medication shipped and additional FDA Medication Guides as required.       DISEASE/MEDICATION-SPECIFIC INFORMATION        For patients on injectable medications: Next injection is scheduled for 9.22.25.    SPECIALTY MEDICATION ADHERENCE     Medication Adherence    Patient reported X missed doses in the last month: 0  Specialty Medication: COSENTYX  (2 SYRINGES) 150 mg/mL Syrg (secukinumab )              Were doses missed due to medication being on hold? No      COSENTYX  (2 SYRINGES) 150 mg/mL Syrg (secukinumab ): 0 doses of medicine on hand       REFERRAL TO PHARMACIST     Referral to the pharmacist: Yes - patient request      SHIPPING     Shipping address confirmed in Epic.     Cost and Payment: Patient has a $0 copay, payment information is not required.    Delivery Scheduled: Yes, Expected medication delivery date: 9.17.25.     Medication will be delivered via Same Day Courier to the prescription address in Epic WAM.    Doyal Hurst   [facility]  Specialty Technician

## 2023-11-29 NOTE — Unmapped (Signed)
 This pharmacist was notified by a technician that this patient has reported they are experiencing the following side effects swelling in the feet.. I have reached out to the patient and spoke to the patient's daughter who states patient has complained about cramping in the leg.  Instructed daughter to have patient seen by PCP as this may be unrelated to Cosentyx .  Patient does not inject in the thihgs      Approximate time spent: 5-10 minutes    Rosalynn GORMAN Kin, PharmD, Clinical Specialty Pharmacist  [facility]

## 2023-12-07 DIAGNOSIS — Z794 Long term (current) use of insulin: Principal | ICD-10-CM

## 2023-12-07 DIAGNOSIS — E11311 Type 2 diabetes mellitus with unspecified diabetic retinopathy with macular edema: Principal | ICD-10-CM

## 2023-12-11 MED FILL — LEUCOVORIN CALCIUM 25 MG TABLET: ORAL | 28 days supply | Qty: 4 | Fill #1

## 2023-12-12 NOTE — Unmapped (Signed)
 Dr Anne Barber- pt is having lesions on her private area around 2 weeks before her infusion and they would like to discuss another treatment plan because Cosentyx  is not being tolerated well. she only has abt 9 good days out of the month. please call to discuss.

## 2023-12-13 DIAGNOSIS — M47819 Spondylosis without myelopathy or radiculopathy, site unspecified: Principal | ICD-10-CM

## 2023-12-13 DIAGNOSIS — M1380 Other specified arthritis, unspecified site: Principal | ICD-10-CM

## 2023-12-13 DIAGNOSIS — L732 Hidradenitis suppurativa: Principal | ICD-10-CM

## 2023-12-13 MED ORDER — COSENTYX 300 MG/2 SYRINGES (150 MG/ML) SUBCUTANEOUS SYRINGE
SUBCUTANEOUS | 3 refills | 0.00000 days | Status: CP
Start: 2023-12-13 — End: ?
  Filled 2023-12-13: qty 4, 56d supply, fill #1
  Filled 2024-01-09: qty 4, 28d supply, fill #0

## 2023-12-13 NOTE — Unmapped (Signed)
 Called her back regarding her recent call to the clinic about flaring disease. She is having breakthrough hidradenitis genital lesions and arthritis on the secukinumab  300mg  q28d dosing and has been compliant with this and maximum dose methotrexate . She will flare about two weeks after each dose of secukinumab . I have recommended she see dermatology again for local treatment options of the hidradenitis and we will also advance her secukinumab  dose to the full hidradenitis suppurativa dose of 300mg  q14d.    Beverley Sharps MD  PGY5 Rheumatology

## 2023-12-21 MED FILL — TRULICITY 1.5 MG/0.5 ML SUBCUTANEOUS PEN INJECTOR: SUBCUTANEOUS | 28 days supply | Qty: 2 | Fill #1

## 2023-12-25 NOTE — Unmapped (Signed)
 Rep from [facility] member svcs states that cosentyx  needs a prior serbia

## 2023-12-26 NOTE — Unmapped (Signed)
 Great.  Thanks

## 2023-12-26 NOTE — Unmapped (Signed)
 PA request received for medication Cosentyx  . Dot phrase with MAP requested information routed via staff message to MAP pool. See referral tab for updates.

## 2024-01-01 MED FILL — ASPIRIN 81 MG TABLET,DELAYED RELEASE: ORAL | 90 days supply | Qty: 90 | Fill #3

## 2024-01-01 MED FILL — LEUCOVORIN CALCIUM 25 MG TABLET: ORAL | 28 days supply | Qty: 4 | Fill #2

## 2024-01-01 MED FILL — PEN NEEDLE, DIABETIC 32 GAUGE X 5/32" (4 MM): 50 days supply | Qty: 100 | Fill #1
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
## 2024-01-02 DIAGNOSIS — M47819 Spondylosis without myelopathy or radiculopathy, site unspecified: Principal | ICD-10-CM

## 2024-01-02 DIAGNOSIS — M1380 Other specified arthritis, unspecified site: Principal | ICD-10-CM

## 2024-01-02 DIAGNOSIS — L732 Hidradenitis suppurativa: Principal | ICD-10-CM

## 2024-01-02 NOTE — Unmapped (Signed)
 Clinical Assessment Needed For: Dose Change  Medication: COSENTYX  (2 SYRINGES) 150 mg/mL Syrg (secukinumab )  Last Fill Date/Day Supply: 12/13/23 / 56 days  Prior Authorization Required  Was previous dose already scheduled to fill: No    Notes to Pharmacist: PA pending

## 2024-01-05 ENCOUNTER — Inpatient Hospital Stay: Admit: 2024-01-05 | Discharge: 2024-01-05 | Payer: Medicare (Managed Care)

## 2024-01-05 ENCOUNTER — Ambulatory Visit
Admit: 2024-01-05 | Discharge: 2024-01-05 | Payer: Medicare (Managed Care) | Attending: Student in an Organized Health Care Education/Training Program | Primary: Student in an Organized Health Care Education/Training Program

## 2024-01-05 DIAGNOSIS — M1380 Other specified arthritis, unspecified site: Principal | ICD-10-CM

## 2024-01-05 DIAGNOSIS — L732 Hidradenitis suppurativa: Principal | ICD-10-CM

## 2024-01-05 DIAGNOSIS — M792 Neuralgia and neuritis, unspecified: Principal | ICD-10-CM

## 2024-01-05 DIAGNOSIS — M47819 Spondylosis without myelopathy or radiculopathy, site unspecified: Principal | ICD-10-CM

## 2024-01-05 DIAGNOSIS — M2550 Pain in unspecified joint: Principal | ICD-10-CM

## 2024-01-05 LAB — CBC W/ AUTO DIFF
BASOPHILS ABSOLUTE COUNT: 0 10*9/L (ref 0.0–0.1)
BASOPHILS RELATIVE PERCENT: 0.7 %
EOSINOPHILS ABSOLUTE COUNT: 0.1 10*9/L (ref 0.0–0.5)
EOSINOPHILS RELATIVE PERCENT: 1.5 %
HEMATOCRIT: 33.9 % — ABNORMAL LOW (ref 34.0–44.0)
HEMOGLOBIN: 11 g/dL — ABNORMAL LOW (ref 11.3–14.9)
LYMPHOCYTES ABSOLUTE COUNT: 1.2 10*9/L (ref 1.1–3.6)
LYMPHOCYTES RELATIVE PERCENT: 18.4 %
MEAN CORPUSCULAR HEMOGLOBIN CONC: 32.4 g/dL (ref 32.0–36.0)
MEAN CORPUSCULAR HEMOGLOBIN: 24.9 pg — ABNORMAL LOW (ref 25.9–32.4)
MEAN CORPUSCULAR VOLUME: 76.7 fL — ABNORMAL LOW (ref 77.6–95.7)
MEAN PLATELET VOLUME: 9.4 fL (ref 6.8–10.7)
MONOCYTES ABSOLUTE COUNT: 0.5 10*9/L (ref 0.3–0.8)
MONOCYTES RELATIVE PERCENT: 8.1 %
NEUTROPHILS ABSOLUTE COUNT: 4.7 10*9/L (ref 1.8–7.8)
NEUTROPHILS RELATIVE PERCENT: 71.3 %
PLATELET COUNT: 260 10*9/L (ref 150–450)
RED BLOOD CELL COUNT: 4.42 10*12/L (ref 3.95–5.13)
RED CELL DISTRIBUTION WIDTH: 19.6 % — ABNORMAL HIGH (ref 12.2–15.2)
WBC ADJUSTED: 6.6 10*9/L (ref 3.6–11.2)

## 2024-01-05 LAB — BUN: BLOOD UREA NITROGEN: 12 mg/dL (ref 9–23)

## 2024-01-05 LAB — CREATININE
CREATININE: 0.78 mg/dL (ref 0.55–1.02)
EGFR CKD-EPI (2021) FEMALE: 85 mL/min/1.73m2 (ref >=60)

## 2024-01-05 LAB — ALBUMIN: ALBUMIN: 3.5 g/dL (ref 3.4–5.0)

## 2024-01-05 LAB — AST: AST (SGOT): 20 U/L (ref <=34)

## 2024-01-05 LAB — ALT: ALT (SGPT): 15 U/L (ref 10–49)

## 2024-01-05 MED ORDER — DULOXETINE 30 MG CAPSULE,DELAYED RELEASE
ORAL_CAPSULE | Freq: Every day | ORAL | 3 refills | 90.00000 days | Status: CP
Start: 2024-01-05 — End: 2025-01-04

## 2024-01-05 MED ORDER — METHOTREXATE SODIUM (CONTAINS PRESERVATIVES) 25 MG/ML INJECTION SOLUTION
SUBCUTANEOUS | 2 refills | 70.00000 days | Status: CP
Start: 2024-01-05 — End: ?

## 2024-01-05 MED ORDER — NYSTATIN 100,000 UNIT/GRAM TOPICAL POWDER
TOPICAL | 0 refills | 0.00000 days | Status: CP
Start: 2024-01-05 — End: 2025-01-04

## 2024-01-05 MED ORDER — LEUCOVORIN CALCIUM 25 MG TABLET
ORAL_TABLET | ORAL | 3 refills | 91.00000 days | Status: CP
Start: 2024-01-05 — End: 2025-01-04
  Filled 2024-02-15: qty 4, 28d supply, fill #0

## 2024-01-05 NOTE — Unmapped (Signed)
 [facility] Refill Coordination Note    Specialty Medication(s) to be Shipped:   Inflammatory Disorders: Cosentyx     Other medication(s) to be shipped: No additional medications requested for fill at this time    Specialty Medications not needed at this time: N/A     Anne [facility], DOB: 09-28-1960  Phone: [phone] (home)       All above HIPAA information was verified with patient's family member, daughter.     Was a Nurse, learning disability used for this call? No    Completed refill call assessment today to schedule patient's medication shipment from the [facility]  [phone]).  All relevant notes have been reviewed.     Specialty medication(s) and dose(s) confirmed: Patient reports changes to the regimen as follows: increased to 300mg  q 14days, daughter is aware   Changes to medications: [facility] reports no changes at this time.  Changes to insurance: No  New side effects reported not previously addressed with a pharmacist or physician: None reported  Questions for the pharmacist: No    Confirmed patient received a Conservation officer, historic buildings and a Surveyor, mining with first shipment. The patient will receive a drug information handout for each medication shipped and additional FDA Medication Guides as required.       DISEASE/MEDICATION-SPECIFIC INFORMATION        For patients on injectable medications: Next injection is scheduled for 10/18.    SPECIALTY MEDICATION ADHERENCE              Were doses missed due to medication being on hold? No    Cosentyx  150 mg/ml: 0 doses of medicine on hand        REFERRAL TO PHARMACIST     Referral to the pharmacist: No - specialty medication dose was changed, however patient has been counseled and pharmacist has reviewed.      SHIPPING     Shipping address confirmed in Epic.     Cost and Payment: Patient has a $0 copay, payment information is not required.    Delivery Scheduled: Yes, Expected medication delivery date: 10/14. Medication will be delivered via Same Day Courier to the prescription address in Epic WAM.    Anne [facility], PharmD   [facility]  Specialty Pharmacist

## 2024-01-05 NOTE — Unmapped (Addendum)
 Let's start duloxetine  for the neck pain at 30mg  daily, neck X-ray today to make sure the spine is stable from prior    Nystatin  powder for under the arms and breasts and other areas with the red rash    Continue the methotrexate  and cosentyx  how you have been using them.    Comencemos con duloxetina para el dolor de cuello a 30 mg diarios, radiograf??a de cuello hoy para asegurarnos de que la columna est?? estable desde antes.Polvo de nistatina para debajo de los brazos y los senos y [place] ??reas con erupci??n roja.Contin??e con el metotrexato y Cosentyx  como los ha [place].

## 2024-01-05 NOTE — Unmapped (Signed)
 Clinical Assessment Needed For: Dose Change  Medication: COSENTYX  (2 SYRINGES) 150 mg/mL Syrg (secukinumab )  Last Fill Date/Day Supply: 12/13/23 / 56 days  Copay $0  Was previous dose already scheduled to fill: No    Notes to Pharmacist:

## 2024-01-05 NOTE — Unmapped (Signed)
 I saw and evaluated the patient, participating in the key portions of the service.  I reviewed the fellow???s note.  I agree with the fellow???s findings and plan.    Danella Maiers, MD, MSCI  Assistant Professor of Medicine  Department of Medicine/Division of Rheumatology  [facility] [place]  [phone] clinic phone  [phone] clinic secure fax

## 2024-01-16 MED FILL — DEXCOM G7 SENSOR DEVICE: 30 days supply | Qty: 3 | Fill #0

## 2024-01-16 MED FILL — TRULICITY 1.5 MG/0.5 ML SUBCUTANEOUS PEN INJECTOR: SUBCUTANEOUS | 28 days supply | Qty: 2 | Fill #2

## 2024-01-16 MED FILL — DEXCOM G7 RECEIVER: 90 days supply | Qty: 1 | Fill #0
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
## 2024-01-19 DIAGNOSIS — N3281 Overactive bladder: Principal | ICD-10-CM

## 2024-01-19 MED ORDER — VIBEGRON 75 MG TABLET
ORAL_TABLET | Freq: Every day | ORAL | 11 refills | 30.00000 days | Status: CP
Start: 2024-01-19 — End: ?

## 2024-01-23 MED FILL — INSULIN GLARGINE-YFGN (U-100) 100 UNIT/ML (3 ML) SUBCUTANEOUS PEN: SUBCUTANEOUS | 30 days supply | Qty: 15 | Fill #2

## 2024-01-30 NOTE — Progress Notes (Signed)
 [facility] Refill Coordination Note    Specialty Medication(s) to be Shipped:   Inflammatory Disorders: Cosentyx     Other medication(s) to be shipped: No additional medications requested for fill at this time    Specialty Medications not needed at this time: N/A     Anne Barber, DOB: 09/10/1960  Phone: [phone] (home)       All above HIPAA information was verified with patient's family member, daughter.     Was a nurse, learning disability used for this call? No    Completed refill call assessment today to schedule patient's medication shipment from the [facility]  [phone]).  All relevant notes have been reviewed.     Specialty medication(s) and dose(s) confirmed: Regimen is correct and unchanged.   Changes to medications: Anne Barber reports no changes at this time.  Changes to insurance: No  New side effects reported not previously addressed with a pharmacist or physician: None reported  Questions for the pharmacist: No    Confirmed patient received a Conservation Officer, Historic Buildings and a Surveyor, Mining with first shipment. The patient will receive a drug information handout for each medication shipped and additional FDA Medication Guides as required.       DISEASE/MEDICATION-SPECIFIC INFORMATION        For patients on injectable medications: Next injection is scheduled for 11.18.25.    SPECIALTY MEDICATION ADHERENCE     Medication Adherence    Patient reported X missed doses in the last month: 0  Specialty Medication: COSENTYX  (2 SYRINGES) 150 mg/mL Syrg (secukinumab )  Patient is on additional specialty medications: No              Were doses missed due to medication being on hold? No      COSENTYX  (2 SYRINGES) 150 mg/mL Syrg (secukinumab ): 0 doses of medicine on hand       REFERRAL TO PHARMACIST     Referral to the pharmacist: Not needed      [facility]     Shipping address confirmed in Epic.     Cost and Payment: Patient has a $0 copay, payment information is not required.    Delivery Scheduled: Yes, Expected medication delivery date: 11.14.25.     Medication will be delivered via Same Day Courier to the prescription address in Epic WAM.    Doyal Hurst   [facility]  Specialty Technician
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
## 2024-02-09 MED FILL — COSENTYX 300 MG/2 SYRINGES (150 MG/ML) SUBCUTANEOUS SYRINGE: SUBCUTANEOUS | 28 days supply | Qty: 4 | Fill #1

## 2024-02-12 DIAGNOSIS — K219 Gastro-esophageal reflux disease without esophagitis: Principal | ICD-10-CM

## 2024-02-12 MED ORDER — METFORMIN 1,000 MG TABLET
ORAL_TABLET | Freq: Two times a day (BID) | ORAL | 0 refills | 30.00000 days | Status: CP
Start: 2024-02-12 — End: 2025-02-11
  Filled 2024-02-15: qty 60, 30d supply, fill #0

## 2024-02-12 MED ORDER — OMEPRAZOLE 20 MG CAPSULE,DELAYED RELEASE
ORAL_CAPSULE | Freq: Every day | ORAL | 0 refills | 30.00000 days | Status: CP
Start: 2024-02-12 — End: 2024-03-13
  Filled 2024-02-15: qty 30, 30d supply, fill #0

## 2024-02-12 NOTE — Addendum Note (Signed)
 Addended by: ODELIA MADISON OVENS on: 02/12/2024 09:25 AM     Modules accepted: Level of Service

## 2024-02-12 NOTE — Progress Notes (Signed)
 I saw and evaluated the patient, participating in the key portions of the service.  I reviewed the residents note. I agree with the residents findings and plan. I provided the required supervision for all services billed. I independently reviewed the image(s) and agree with the interpretation and plan as documented in the residents note.

## 2024-02-12 NOTE — Telephone Encounter (Signed)
 Patient is requesting the following refill  Requested Prescriptions     Pending Prescriptions Disp Refills    metFORMIN  (GLUCOPHAGE ) 1000 MG tablet 180 tablet 3     Sig: Take 1 tablet (1,000 mg total) by mouth 2 (two) times a day with meals.    omeprazole  (PRILOSEC) 20 MG capsule 90 capsule 3     Sig: Take 1 capsule (20 mg total) by mouth daily.       Recent Visits  Date Type Provider Dept   08/31/23 Office Visit Lora [name] HERO, FNP [facility] [address] [facility]   Showing recent visits within past 365 days and meeting all other requirements  Future Appointments  Date Type Provider Dept   03/14/24 Appointment Lora [name] HERO, FNP [facility] [address] [facility]   Showing future appointments within next 365 days and meeting all other requirements       Labs: A1c:   HGB A1C, POC (%)   Date Value   11/16/2016 11.3 (H)     Hemoglobin A1C (%)   Date Value   08/31/2023 7.4 (A)

## 2024-02-13 NOTE — Progress Notes (Signed)
 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   - Continue following with Rheum (LOV 12/2023)  - Continue secukinumab  300 mg/2 mL q2 weeks (managed by rheum)  - Continue methotrexate  25 mg weekly (managed by rheum)  - Continue leucovorin  25 mg weekly (managed by rheum)  - As needed, clobetasol  ointment 2x/day for 3-5 days for flares  - Consider rinvoq vs bimzelx in future     High Risk Medication Monitoring  - managed by rheum    Intertrigo - chronic, flaring in the setting of immunosuppression  - Diagnosis, treatment options, prognosis, risk/ benefit, and side effects of treatment were discussed with the patient.   - Discussed higher risk due to Cosentyx   - Start nystatin  (MYCOSTATIN ) 100,000 unit/gram ointment; Apply 2-3x daily to rash in skin folds for yeast     The patient was advised to call for an appointment should any new, changing, or symptomatic lesions develop.     RTC: Return in about 6 months (around 08/14/2024). or sooner as needed   _________________________________________________________________      Chief Complaint     Chief Complaint   Patient presents with    HS     Under arms under breast and belly had a 2 months ago no flares or draiange       HPI     [facility] is a 63 y.o. female who presents as a returning patient (last seen 07/27/2022) to Dermatology for follow up of HS.     History of Present Illness  [facility] is a 63 year old female with hidradenitis suppurativa who presents for follow-up regarding her skin condition.    She has been experiencing lesions under her breasts, armpits, groin, and abdomen for almost three months. Recently, she switched to a biweekly dosing of Cosentyx  injections. However, they persist, particularly under her breasts, and are not painful unless rubbed with a towel. She is not currently using any creams or topical medications.    The Cosentyx  injections were initially administered monthly and are now given biweekly. However, she experiences increased pain and symptoms four days before her next injection is due. This pain is described as severe, likened to 'just gave birth,' and affects her ability to move and perform daily activities. She has a history of fibromyalgia and arthritis, which may contribute to her pain symptoms.    She is concerned about the dryness of her skin, which she attributes to the injections, but she has not noticed any active hidradenitis suppurativa flares recently. She is worried about the potential impact of her medications on her kidneys, although she has not been informed of any adverse effects related to her current treatments. She is also under the care of a urologist for UTIs, with her next appointment scheduled in a year, as her recent check-up was satisfactory.        The patient denies any other new or changing lesions or areas of concern.     Pertinent Past Medical History       Problem List          Musculoskeletal and Integument    Hidradenitis suppurativa - Primary    Relevant Medications    clobetasol  (TEMOVATE ) 0.05 % ointment  Past Medical History, Family History, Social History, Medication List, Allergies, and Problem List were reviewed in the rooming section of Epic.     ROS: Other than symptoms mentioned in the HPI, no fevers, chills, or other skin complaints    Physical Examination     GENERAL: Well-appearing female in no acute distress, resting comfortably.  NEURO: Alert and oriented, answers questions appropriately  PSYCH: Normal mood and affect  SKIN: Examination of the axillae, groin, inframammary folds, abdomen was performed  - no active HS sites  - erythematous patches of the bilateral axillae, inframammary folds, infrapannus      All areas not commented on are within normal limits or unremarkable.           (Approved Template 12/09/2019)

## 2024-02-15 DIAGNOSIS — L304 Erythema intertrigo: Principal | ICD-10-CM

## 2024-02-15 DIAGNOSIS — Z79899 Other long term (current) drug therapy: Principal | ICD-10-CM

## 2024-02-15 DIAGNOSIS — L732 Hidradenitis suppurativa: Principal | ICD-10-CM

## 2024-02-15 MED ORDER — NYSTATIN 100,000 UNIT/GRAM TOPICAL OINTMENT
TOPICAL | 5 refills | 0.00000 days | Status: CP
Start: 2024-02-15 — End: ?
  Filled 2024-02-16: qty 30, 10d supply, fill #0

## 2024-02-15 MED ORDER — CLOBETASOL 0.05 % TOPICAL OINTMENT
Freq: Two times a day (BID) | TOPICAL | 1 refills | 0.00000 days | Status: CP
Start: 2024-02-15 — End: 2025-02-14
  Filled 2024-02-16: qty 60, 30d supply, fill #0

## 2024-02-15 MED FILL — LOSARTAN 25 MG TABLET: ORAL | 90 days supply | Qty: 90 | Fill #2

## 2024-02-15 MED FILL — TRULICITY 1.5 MG/0.5 ML SUBCUTANEOUS PEN INJECTOR: SUBCUTANEOUS | 28 days supply | Qty: 2 | Fill #3

## 2024-02-15 MED FILL — ATORVASTATIN 40 MG TABLET: ORAL | 90 days supply | Qty: 90 | Fill #2

## 2024-02-15 MED FILL — AMLODIPINE 5 MG TABLET: ORAL | 90 days supply | Qty: 90 | Fill #1

## 2024-02-15 NOTE — Patient Instructions (Signed)
 Meet your team:     Your intake nurse is: Britt Boozer    Please remember to fill out the survey you will receive after your visit. Your comments help [place] continue to improve our care.      Thanks in advance!      [facility] Clinical Staff

## 2024-03-01 NOTE — Progress Notes (Signed)
 [facility] Specialty and Home Delivery Pharmacy Refill Coordination Note    Specialty Medication(s) to be Shipped:   Inflammatory Disorders: Cosentyx     Other medication(s) to be shipped: No additional medications requested for fill at this time    Specialty Medications not needed at this time: N/A     Anne Barber, DOB: 12-30-1960  Phone: [phone] (home)       All above HIPAA information was verified with patient's family member, daughter.     Was a nurse, learning disability used for this call? No    Completed refill call assessment today to schedule patient's medication shipment from the [facility] and [facility] Delivery Pharmacy  [phone]).  All relevant notes have been reviewed.     Specialty medication(s) and dose(s) confirmed: Regimen is correct and unchanged.   Changes to medications: Mery reports no changes at this time.  Changes to insurance: No  New side effects reported not previously addressed with a pharmacist or physician: None reported  Questions for the pharmacist: No    Confirmed patient received a Conservation Officer, Historic Buildings and a Surveyor, Mining with first shipment. The patient will receive a drug information handout for each medication shipped and additional FDA Medication Guides as required.       DISEASE/MEDICATION-SPECIFIC INFORMATION        For patients on injectable medications: Next injection is scheduled for 12/18.    SPECIALTY MEDICATION ADHERENCE     Medication Adherence    Patient reported X missed doses in the last month: 0  Specialty Medication: COSENTYX  (2 SYRINGES) 150 mg/mL Syrg (secukinumab )  Patient is on additional specialty medications: No  Patient is on more than two specialty medications: No  Any gaps in refill history greater than 2 weeks in the last 3 months: no  Demonstrates understanding of importance of adherence: yes  Informant: child/children  Reliability of informant: reliable  Provider-estimated medication adherence level: good  Patient is at risk for Non-Adherence: No  Reasons for non-adherence: no problems identified  Confirmed plan for next specialty medication refill: delivery by pharmacy  Refills needed for supportive medications: not needed          Refill Coordination    Has the Patients' Contact Information Changed: No  Is the Shipping Address Different: No         Were doses missed due to medication being on hold? No    Cosentyx  150 mg/ml: 0 doses of medicine on hand       REFERRAL TO PHARMACIST     Referral to the pharmacist: Not needed      [facility]     Shipping address confirmed in Epic.     Cost and Payment: Patient has a $0 copay, payment information is not required.    Delivery Scheduled: Yes, Expected medication delivery date: 12/10.     Medication will be delivered via Next Day Courier to the prescription address in Epic [place].    Anne Barber   [facility] Specialty and Home Delivery Pharmacy  Specialty Technician

## 2024-03-06 MED ORDER — METFORMIN 1,000 MG TABLET
ORAL_TABLET | Freq: Two times a day (BID) | ORAL | 0 refills | 30.00000 days | Status: CP
Start: 2024-03-06 — End: 2024-04-05
  Filled 2024-03-11: qty 60, 30d supply, fill #0

## 2024-03-06 NOTE — Telephone Encounter (Signed)
 Patient is requesting the following refill  Requested Prescriptions     Pending Prescriptions Disp Refills    metFORMIN  (GLUCOPHAGE ) 1000 MG tablet 60 tablet 0     Sig: Take 1 tablet (1,000 mg total) by mouth 2 (two) times a day with meals.       Recent Visits  Date Type Provider Dept   08/31/23 Office Visit Lora [name] HERO, FNP [facility] [address] [facility]   Showing recent visits within past 365 days and meeting all other requirements  Future Appointments  Date Type Provider Dept   03/14/24 Appointment Lora [name] HERO, FNP [place] [facility] [address] At [facility]   Showing future appointments within next 365 days and meeting all other requirements       Labs: A1c:   HGB A1C, POC (%)   Date Value   11/16/2016 11.3 (H)     Hemoglobin A1C (%)   Date Value   08/31/2023 7.4 (A)

## 2024-03-07 MED ORDER — METHOTREXATE SODIUM (CONTAINS PRESERVATIVES) 25 MG/ML INJECTION SOLUTION
SUBCUTANEOUS | 2 refills | 70.00000 days | Status: CP
Start: 2024-03-07 — End: ?

## 2024-03-07 MED FILL — COSENTYX 300 MG/2 SYRINGES (150 MG/ML) SUBCUTANEOUS SYRINGE: SUBCUTANEOUS | 28 days supply | Qty: 4 | Fill #2

## 2024-03-07 NOTE — Telephone Encounter (Incomplete)
 Requested Prescriptions     Pending Prescriptions Disp Refills    methotrexate  sodium (METHOTREXATE , CONTAINS PRESERVATIVES,) 25 mg/mL injection solution 10 mL 2     Sig: Inject 1 mL (25 mg total) under the skin once a week.     Last Visit Date: 01/05/2024  Next Visit Date: 05/17/2024    Lab Results   Component Value Date    ALT 15 01/05/2024    AST 20 01/05/2024    ALBUMIN 3.5 01/05/2024    CREATININE 0.78 01/05/2024     Lab Results   Component Value Date    WBC 6.6 01/05/2024    HGB 11.0 (L) 01/05/2024    HCT 33.9 (L) 01/05/2024    PLT 260 01/05/2024     Lab Results   Component Value Date    NEUTROPCT 71.3 01/05/2024    LYMPHOPCT 18.4 01/05/2024    MONOPCT 8.1 01/05/2024    EOSPCT 1.5 01/05/2024    BASOPCT 0.7 01/05/2024     No results found for: VITD

## 2024-03-08 ENCOUNTER — Inpatient Hospital Stay: Admit: 2024-03-08 | Discharge: 2024-03-08 | Payer: Medicare (Managed Care)

## 2024-03-11 DIAGNOSIS — E11319 Type 2 diabetes mellitus with unspecified diabetic retinopathy without macular edema: Principal | ICD-10-CM

## 2024-03-11 DIAGNOSIS — Z794 Long term (current) use of insulin: Principal | ICD-10-CM

## 2024-03-11 MED ORDER — LANTUS SOLOSTAR U-100 INSULIN 100 UNIT/ML (3 ML) SUBCUTANEOUS PEN
Freq: Every evening | SUBCUTANEOUS | 11 refills | 90.00000 days | Status: CP
Start: 2024-03-11 — End: 2025-03-11
  Filled 2024-03-12: qty 45, 90d supply, fill #0

## 2024-03-11 MED FILL — LEUCOVORIN CALCIUM 25 MG TABLET: ORAL | 28 days supply | Qty: 4 | Fill #1

## 2024-03-11 MED FILL — TRULICITY 1.5 MG/0.5 ML SUBCUTANEOUS PEN INJECTOR: SUBCUTANEOUS | 28 days supply | Qty: 2 | Fill #4

## 2024-03-11 NOTE — Telephone Encounter (Signed)
 Patient is requesting the following refill  Requested Prescriptions     Pending Prescriptions Disp Refills    insulin  glargine-yfgn (SEMGLEE ,INSULIN  GLARG-YFGN,PEN) 100 unit/mL (3 mL) InPn 45 mL 11     Sig: Inject 50 Units under the skin nightly.       Recent Visits  Date Type Provider Dept   08/31/23 Office Visit Lora [name] HERO, FNP [place] [facility] [address] [facility]   Showing recent visits within past 365 days and meeting all other requirements  Future Appointments  Date Type Provider Dept   03/14/24 Appointment Lora [name] HERO, FNP [place] [facility] [address] At [facility]   Showing future appointments within next 365 days and meeting all other requirements       Labs: A1c:   HGB A1C, POC (%)   Date Value   11/16/2016 11.3 (H)     Hemoglobin A1C (%)   Date Value   08/31/2023 7.4 (A)

## 2024-03-13 MED ORDER — LEUCOVORIN CALCIUM 25 MG TABLET
ORAL_TABLET | ORAL | 3 refills | 91.00000 days | Status: CP
Start: 2024-03-13 — End: 2025-03-13

## 2024-03-13 MED ORDER — METHOTREXATE SODIUM (CONTAINS PRESERVATIVES) 25 MG/ML INJECTION SOLUTION
SUBCUTANEOUS | 2 refills | 70.00000 days | Status: CP
Start: 2024-03-13 — End: ?

## 2024-03-13 NOTE — Addendum Note (Signed)
 Addended by: ALYCE COREAN MATSU on: 03/13/2024 02:59 PM     Modules accepted: Orders

## 2024-03-13 NOTE — Telephone Encounter (Signed)
 Methotrexate  (different pharmacy)  Last Visit Date: 03/07/2024  Next Visit Date: 05/17/2024    Lab Results   Component Value Date    ALT 15 01/05/2024    AST 20 01/05/2024    ALBUMIN 3.5 01/05/2024    CREATININE 0.78 01/05/2024     Lab Results   Component Value Date    WBC 6.6 01/05/2024    HGB 11.0 (L) 01/05/2024    HCT 33.9 (L) 01/05/2024    PLT 260 01/05/2024     Lab Results   Component Value Date    NEUTROPCT 71.3 01/05/2024    LYMPHOPCT 18.4 01/05/2024    MONOPCT 8.1 01/05/2024    EOSPCT 1.5 01/05/2024    BASOPCT 0.7 01/05/2024     No results found for: VITD

## 2024-03-13 NOTE — Addendum Note (Signed)
 Addended by: CLAUDENE RIGHTER on: 03/13/2024 04:40 PM     Modules accepted: Orders

## 2024-03-13 NOTE — Telephone Encounter (Signed)
 Copied from CRM #[id number]. Topic: Access To Clinicians - Req Clinic Call Back  >> Mar 13, 2024  2:55 PM Karna BIRCH wrote:  PT's daughter called in stating that Pt's MXT needed to be sent to [facility] on [address] [place] [address] and not the [facility] specialty pharmacy. PT is due for meds tomorrow.   Thanks

## 2024-03-14 DIAGNOSIS — E782 Mixed hyperlipidemia: Principal | ICD-10-CM

## 2024-03-14 DIAGNOSIS — L732 Hidradenitis suppurativa: Principal | ICD-10-CM

## 2024-03-14 DIAGNOSIS — G4700 Insomnia, unspecified: Principal | ICD-10-CM

## 2024-03-14 DIAGNOSIS — G8929 Other chronic pain: Principal | ICD-10-CM

## 2024-03-14 DIAGNOSIS — Z794 Long term (current) use of insulin: Principal | ICD-10-CM

## 2024-03-14 DIAGNOSIS — M797 Fibromyalgia: Principal | ICD-10-CM

## 2024-03-14 DIAGNOSIS — K219 Gastro-esophageal reflux disease without esophagitis: Principal | ICD-10-CM

## 2024-03-14 DIAGNOSIS — E11319 Type 2 diabetes mellitus with unspecified diabetic retinopathy without macular edema: Principal | ICD-10-CM

## 2024-03-14 DIAGNOSIS — R0683 Snoring: Principal | ICD-10-CM

## 2024-03-14 DIAGNOSIS — F418 Other specified anxiety disorders: Principal | ICD-10-CM

## 2024-03-14 DIAGNOSIS — M2550 Pain in unspecified joint: Principal | ICD-10-CM

## 2024-03-14 DIAGNOSIS — M542 Cervicalgia: Principal | ICD-10-CM

## 2024-03-14 DIAGNOSIS — I10 Essential (primary) hypertension: Principal | ICD-10-CM

## 2024-03-14 DIAGNOSIS — M1380 Other specified arthritis, unspecified site: Principal | ICD-10-CM

## 2024-03-14 DIAGNOSIS — N3281 Overactive bladder: Principal | ICD-10-CM

## 2024-03-14 DIAGNOSIS — M792 Neuralgia and neuritis, unspecified: Principal | ICD-10-CM

## 2024-03-14 MED ORDER — TRULICITY 3 MG/0.5 ML SUBCUTANEOUS PEN INJECTOR
SUBCUTANEOUS | 4 refills | 28.00000 days | Status: CP
Start: 2024-03-14 — End: ?
  Filled 2024-04-10: qty 2, 28d supply, fill #0

## 2024-03-14 MED ORDER — OMEPRAZOLE 20 MG CAPSULE,DELAYED RELEASE
ORAL_CAPSULE | Freq: Every day | ORAL | 3 refills | 90.00000 days | Status: CP
Start: 2024-03-14 — End: 2025-03-09
  Filled 2024-03-22: qty 90, 90d supply, fill #0

## 2024-03-14 MED ORDER — DULOXETINE 30 MG CAPSULE,DELAYED RELEASE
ORAL_CAPSULE | Freq: Every day | ORAL | 3 refills | 90.00000 days | Status: CP
Start: 2024-03-14 — End: 2025-03-14

## 2024-03-14 MED ORDER — TRAZODONE 50 MG TABLET
ORAL_TABLET | Freq: Every evening | ORAL | 3 refills | 90.00000 days | Status: CP
Start: 2024-03-14 — End: 2024-05-13
  Filled 2024-03-22: qty 90, 90d supply, fill #0

## 2024-03-14 NOTE — Progress Notes (Signed)
 Assessment and Plan:     Anne Barber was seen today for follow-up.    Diagnoses and all orders for this visit:    Type 2 diabetes mellitus with retinopathy, with long-term current use of insulin , macular edema presence unspecified, unspecified laterality, unspecified retinopathy severity (CMS-HCC)          -     A1c today 7.7 (was 7.4--6.3)        -    Pt agreeable to increase in Trulicity  from 1.5 mg to 3 mg weekly        -     Eye care via [facility], foot care        -     Continue: 50 U lantus  BID, 25 mg jardiance , 1000 mg metformin  BID, statin  -     POCT glycosylated hemoglobin (Hb A1C)  -     dulaglutide  (TRULICITY ) 3 mg/0.5 mL injection pen; Inject 0.5 mL (3 mg total) under the skin once a week.    Gastroesophageal reflux disease, unspecified whether esophagitis present          -    Small and frequent meals, avoidance of trigger foods  -     omeprazole  (PRILOSEC) 20 MG capsule; Take 1 capsule (20 mg total) by mouth daily.    Depression with anxiety  Insomnia, unspecified type          -     Refilled duloxetine , trazodone --for unclear reasons, not taking, agreeable to re-start  -     traZODone  (DESYREL ) 50 MG tablet; Take 1 tablet (50 mg total) by mouth nightly.    Neuralgia  Polyarthralgia  Fibromyalgia          -     Refilled duloxetine , topical pain creams, pt aware not to take NSAIDs. Due to cost--not taking pain patches(buprenorphine )  -     DULoxetine  (CYMBALTA ) 30 MG capsule; Take 1 capsule (30 mg total) by mouth daily.    Hidradenitis suppurativa  Intetrigo            - Managed by dermatology, last visit 02/15/24 --secukinumab , methotrexate  25mg  weekly with leucovorin  25mg  weekly, rx for nystatin , follow up in 6 months    Primary hypertension  CAD  Mixed hyperlipidemia         -Adherent with: 5 mg amlodipine , 81 mg ASA, 40 mg atorvastatin , 25 mg losartan , Cr 0.78, K 4.2; LDL: 51       - Followed by cardiology, last visit 7/25    Seronegative inflammatory arthritis          -  Last seen by rheumatology 10/25        -  Continue secukinumab  300mg  q14d        -  Continue subcutaneous methotrexate  25mg  weekly with leucovorin  25mg  weekly      OAB (overactive bladder)          -     Symptoms slightly improved with vibegron , referral for sleep study  -     Polysomnography (Standard); Future    Snoring  -     Polysomnography (Standard); Future    Chronic neck pain          -     Reviewed neck x-ray results: Mild multilevel degenerative disc disease and facet arthropathy   -     Ambulatory referral to Spine Center; Future    Other orders  -     Covid-19 Vac, (104yr+)(Comirnaty) MRNA Pfizer  Barriers to recommended plan: None identified    Return for 3-4 months/meses.      Subjective:     HPI: Anne Barber is a 63 y.o. female here for Follow-up.    Cough and runny nose: used nasal spray, tessalon  pearles--helpful, feels symptoms improved, no chest pains, no breathing problems, no fevers    Type 2 DM:   50 U lantus  BID, 25 mg jardiance , 1000 mg metformin  BID. 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:  Last seen by rheumatology 10/25  continue secukinumab  300mg  q14d  Continue subcutaneous methotrexate  25mg  weekly with leucovorin  25mg  weekly    HS: last seen by dermatology: 02/15/24 --secukinumab , methotrexate  25mg  weekly with leucovorin  25mg  weekly, rx for nystatin , follow up in 6 months    OAB: seen by uro-gyn: taking vibegron --feels helpful to slightly reduce symptoms, last visit with Uro-GYN 10/25, pt agreeable for sleep study    Depression, insomnia: due to unclear reasons not taking trazodone , cymbalta --rx sent to pharmacy--agreeable to restart    GERD: needs PPI rx--refilled today    Chronic pain: feels current regimen by rheumatology helpful for joint pains, continues to have chronic neck pain, reviewed neck x-ray results  Due to cost, not using pain patches, agreeable for spine center referral    Agreeable for COVID vaccine  Here with daughter  I have reviewed past medical, surgical, medications, allergies, social and family histories today and updated them in Epic where appropriate.    ROS:   Review of Systems   Respiratory:  Positive for cough.    Cardiovascular: Negative.    Gastrointestinal:  Positive for abdominal distention.   Musculoskeletal:  Positive for arthralgias and neck pain.   Psychiatric/Behavioral:  Positive for sleep disturbance.       Wt Readings from Last 6 Encounters:   03/14/24 84 kg (185 lb 1.6 oz)   01/19/24 88.6 kg (195 lb 4.8 oz)   01/05/24 87.9 kg (193 lb 12.8 oz)   10/20/23 (P) 86.4 kg (190 lb 6.4 oz)   09/08/23 85.8 kg (189 lb 3.2 oz)   08/31/23 85.7 kg (189 lb)      PHQ-9 PHQ-9 Total Score   08/31/2023   2:30 PM 0   02/01/2022  10:00 AM 2    01/29/2021   1:00 PM 9    01/20/2020   5:00 PM 7    11/05/2019   1:00 PM 11        Data saved with a previous flowsheet row definition      GAD7 Total Score GAD-7 Total Score   02/01/2022  10:00 AM 0   01/29/2021   1:00 PM 3   01/20/2020   6:00 PM 7      Review of systems negative unless otherwise noted as per HPI.      Objective:     Vitals:    03/14/24 1529   BP: 122/72   Pulse: 80   Temp: 36.2 ??C (97.1 ??F)   SpO2: 98%     Body mass index is 36.15 kg/m??.    Physical Exam  Vitals and nursing note reviewed.  Constitutional:       Appearance: Normal appearance.   HENT:      Head: Normocephalic and atraumatic.   Cardiovascular:      Rate and Rhythm: Normal rate and regular rhythm.      Pulses: Normal pulses.      Heart sounds: Normal heart sounds.   Pulmonary:      Effort: Pulmonary effort is normal.      Breath sounds: Normal breath sounds.   Musculoskeletal:         General: Normal range of motion. Cervical back: Normal range of motion and neck supple.   Skin:     General: Skin is warm.      Capillary Refill: Capillary refill takes less than 2 seconds.   Neurological:      General: No focal deficit present.      Mental Status: She is alert and oriented to person, place, and time. Mental status is at baseline.   Psychiatric:         Mood and Affect: Mood normal.         Behavior: Behavior normal.         Thought Content: Thought content normal.         Judgment: Judgment normal.            Medication adherence and barriers to the treatment plan have been addressed. Opportunities to optimize healthy behaviors have been discussed. Patient / caregiver voiced understanding.   I personally spent 45 minutes face-to-face and non-face-to-face in the care of this patient, which includes all pre, intra, and post visit time on the date of service.  [name] Grow, DNP, FNP-C  [facility] at [facility]  [phone] (F)    Note - This record has been created using [employer]. Chart creation errors have been sought, but may not always have been located. Such creation errors do not reflect on the standard of medical care.

## 2024-03-14 NOTE — Patient Instructions (Signed)
A1c 7.7

## 2024-03-15 NOTE — Telephone Encounter (Signed)
 Form for Disabled Adults placed in your in-basket for review.

## 2024-03-18 NOTE — Telephone Encounter (Signed)
 Forms faxed

## 2024-03-19 DIAGNOSIS — L732 Hidradenitis suppurativa: Principal | ICD-10-CM

## 2024-03-19 MED ORDER — CLINDAMYCIN PHOSPHATE 1 % TOPICAL SWAB
Freq: Two times a day (BID) | TOPICAL | 11 refills | 0.00000 days | Status: CP
Start: 2024-03-19 — End: ?

## 2024-03-22 MED FILL — DEXCOM G7 SENSOR DEVICE: 30 days supply | Qty: 3 | Fill #1

## 2024-03-27 ENCOUNTER — Ambulatory Visit: Admit: 2024-03-27 | Discharge: 2024-03-28 | Payer: Medicare (Managed Care)

## 2024-03-27 DIAGNOSIS — M542 Cervicalgia: Principal | ICD-10-CM

## 2024-03-27 MED ORDER — TIZANIDINE 4 MG TABLET
ORAL_TABLET | Freq: Three times a day (TID) | ORAL | 0 refills | 20.00000 days | Status: CP | PRN
Start: 2024-03-27 — End: ?

## 2024-04-04 NOTE — Progress Notes (Signed)
 The [facility] has made a second and final attempt to reach this patient to refill the following medication:secukinumab : COSENTYX  (2 SYRINGES) 150 mg/mL Syrg.      We have left voicemails on the following phone numbers: [phone] and [phone]  and have sent a text message to the following phone numbers: [phone].    Dates contacted: 12.31.25 and 1.8.26  Last scheduled delivery: 12.11.25    The patient may be at risk of non-compliance with this medication. The patient should call the [facility] at [phone]  Option 4, then Option 2: Dermatology, Gastroenterology, Rheumatology to refill medication.    Doyal Hurst   [facility] Specialty and [facility]

## 2024-04-05 DIAGNOSIS — I6523 Occlusion and stenosis of bilateral carotid arteries: Principal | ICD-10-CM

## 2024-04-05 DIAGNOSIS — Z794 Long term (current) use of insulin: Principal | ICD-10-CM

## 2024-04-05 DIAGNOSIS — E11319 Type 2 diabetes mellitus with unspecified diabetic retinopathy without macular edema: Principal | ICD-10-CM

## 2024-04-05 MED ORDER — ASPIRIN 81 MG TABLET,DELAYED RELEASE
ORAL_TABLET | Freq: Every day | ORAL | 3 refills | 90.00000 days
Start: 2024-04-05 — End: 2025-04-05

## 2024-04-05 MED ORDER — JARDIANCE 25 MG TABLET
ORAL_TABLET | Freq: Every day | ORAL | 3 refills | 90.00000 days
Start: 2024-04-05 — End: ?

## 2024-04-05 MED ORDER — LEUCOVORIN CALCIUM 25 MG TABLET
ORAL_TABLET | ORAL | 3 refills | 91.00000 days
Start: 2024-04-05 — End: 2025-04-05

## 2024-04-05 MED ORDER — PEN NEEDLE, DIABETIC 32 GAUGE X 5/32" (4 MM)
Freq: Two times a day (BID) | 1 refills | 100.00000 days
Start: 2024-04-05 — End: 2025-04-05

## 2024-04-05 NOTE — Progress Notes (Signed)
 [facility] Specialty and Home Delivery Pharmacy Refill Coordination Note    Specialty Medication(s) to be Shipped:   Inflammatory Disorders: Cosentyx     Other medication(s) to be shipped: Aspirin , Jardiance , Leucovorin , Trulicity  and Pen needles    Specialty Medications not needed at this time: N/A     Anne Barber, DOB: Jul 01, 1960  Phone: [phone] (home)       All above HIPAA information was verified with patient's family member, daughter.     Was a nurse, learning disability used for this call? No -declined interpreter    Completed refill call assessment today to schedule patient's medication shipment from the [facility] and Home Delivery Pharmacy  [phone]).  All relevant notes have been reviewed.     Specialty medication(s) and dose(s) confirmed: Regimen is correct and unchanged.   Changes to medications: Ronan reports no changes at this time.  Changes to insurance: No  New side effects reported not previously addressed with a pharmacist or physician: None reported  Questions for the pharmacist: No    Confirmed patient received a Conservation Officer, Historic Buildings and a Surveyor, Mining with first shipment. The patient will receive a drug information handout for each medication shipped and additional FDA Medication Guides as required.       DISEASE/MEDICATION-SPECIFIC INFORMATION        N/A    SPECIALTY MEDICATION ADHERENCE              Were doses missed due to medication being on hold? No      secukinumab : COSENTYX  (2 SYRINGES) 150 mg/mL Syrg: 0 doses of medicine on hand       Specialty medication is an injection or given on a cycle: Yes, Next injection is scheduled for 1.17.26.    REFERRAL TO PHARMACIST     Referral to the pharmacist: Not needed      [facility]     Shipping address confirmed in Epic.     Cost and Payment: Patient has a copay of $2.99. They are aware and have authorized the pharmacy to charge the credit card on file.    Delivery Scheduled: Yes, Expected medication delivery date: 1.15.26. Daughter is aware about the refill requests sent to provider    Medication will be delivered via Next Day Courier to the prescription address in Epic [place].    Doyal Hurst   [facility] Specialty and Home Delivery Pharmacy  Specialty Technician

## 2024-04-08 MED ORDER — ASPIRIN 81 MG TABLET,DELAYED RELEASE
ORAL_TABLET | Freq: Every day | ORAL | 3 refills | 90.00000 days | Status: CP
Start: 2024-04-08 — End: 2025-04-08
  Filled 2024-04-10: qty 90, 90d supply, fill #0

## 2024-04-08 MED ORDER — PEN NEEDLE, DIABETIC 32 GAUGE X 5/32" (4 MM)
Freq: Two times a day (BID) | 1 refills | 100.00000 days | Status: CP
Start: 2024-04-08 — End: 2025-04-08
  Filled 2024-04-10: qty 200, 100d supply, fill #0

## 2024-04-08 MED ORDER — EMPAGLIFLOZIN 25 MG TABLET
ORAL_TABLET | Freq: Every day | ORAL | 3 refills | 90.00000 days | Status: CP
Start: 2024-04-08 — End: ?
  Filled 2024-04-10: qty 90, 90d supply, fill #0

## 2024-04-10 MED ORDER — LEUCOVORIN CALCIUM 25 MG TABLET
ORAL_TABLET | ORAL | 3 refills | 91.00000 days
Start: 2024-04-10 — End: 2025-04-10

## 2024-04-10 MED FILL — COSENTYX 300 MG/2 SYRINGES (150 MG/ML) SUBCUTANEOUS SYRINGE: SUBCUTANEOUS | 28 days supply | Qty: 4 | Fill #3

## 2024-04-10 NOTE — Telephone Encounter (Signed)
 Refill too soon

## 2024-04-10 NOTE — Telephone Encounter (Signed)
 SSBCI form placed in your in-basket for review.

## 2024-04-11 NOTE — Telephone Encounter (Signed)
 Form faxed. 04/10/24

## 2024-04-17 NOTE — Progress Notes (Signed)
 Ms.Meilyn Darrough called [facility] today, 04/17/2024, to inquire about the status of their order for leucovorin . The new prescription was sent to the [facility] pharmacy mentioned below. The daughter is aware and will be calling them to fill it.      [id number] [place], [place] - [address] AT [facility] [address]  [address] [place], [place] [address]  Phone: [phone]  Fax: [phone]    Velma Ober, Pharm. DSABRA  Pharmacist - [facility] Special[facility] and [facility]  [address] [place], [place] [address]  Phone: [phone] - Fax. [phone]

## 2024-05-01 NOTE — Progress Notes (Signed)
 [facility] Specialty and Home Delivery Pharmacy Refill Coordination Note    Specialty Medication(s) to be Shipped:   Inflammatory Disorders: Cosentyx     Other medication(s) to be shipped: No additional medications requested for fill at this time    Specialty Medications not needed at this time: N/A     Leverne Boys, DOB: 24-Mar-1961  Phone: [phone] (home)       All above HIPAA information was verified with patient's family member, daughter.     Was a nurse, learning disability used for this call? No    Completed refill call assessment today to schedule patient's medication shipment from the [facility] and Home Delivery Pharmacy  [phone]).  All relevant notes have been reviewed.     Specialty medication(s) and dose(s) confirmed: Regimen is correct and unchanged.   Changes to medications: Lestine reports no changes at this time.  Changes to insurance: No  New side effects reported not previously addressed with a pharmacist or physician: None reported  Questions for the pharmacist: No    Confirmed patient received a Conservation Officer, Historic Buildings and a Surveyor, Mining with first shipment. The patient will receive a drug information handout for each medication shipped and additional FDA Medication Guides as required.       DISEASE/MEDICATION-SPECIFIC INFORMATION        N/A    SPECIALTY MEDICATION ADHERENCE     Medication Adherence    Patient reported X missed doses in the last month: 0  Specialty Medication: secukinumab : COSENTYX  (2 SYRINGES) 150 mg/mL Syrg  Patient is on additional specialty medications: No              Were doses missed due to medication being on hold? No      secukinumab : COSENTYX  (2 SYRINGES) 150 mg/mL Syrg: 0 doses of medicine on hand       Specialty medication is an injection or given on a cycle: Yes, Next injection is scheduled for 2.16.26.    REFERRAL TO PHARMACIST     Referral to the pharmacist: Not needed      [facility]     Shipping address confirmed in Epic.     Cost and Payment: Patient has a $0 copay, payment information is not required.    Delivery Scheduled: Yes, Expected medication delivery date: 2.12.26.     Medication will be delivered via Next Day Courier to the prescription address in Epic [place].    Doyal Hurst   [facility] Specialty and Home Delivery Pharmacy  Specialty Technician
